# Patient Record
Sex: Male | Born: 1935 | Race: White | Hispanic: No | Marital: Married | Smoking: Never smoker
Health system: Southern US, Community
[De-identification: ages and names within clinical notes are randomized; demographics above are authoritative.]

## PROBLEM LIST (undated history)

## (undated) DIAGNOSIS — I2699 Other pulmonary embolism without acute cor pulmonale: Secondary | ICD-10-CM

## (undated) DIAGNOSIS — F32A Depression, unspecified: Secondary | ICD-10-CM

## (undated) DIAGNOSIS — I255 Ischemic cardiomyopathy: Secondary | ICD-10-CM

## (undated) DIAGNOSIS — I2510 Atherosclerotic heart disease of native coronary artery without angina pectoris: Secondary | ICD-10-CM

## (undated) DIAGNOSIS — F329 Major depressive disorder, single episode, unspecified: Secondary | ICD-10-CM

## (undated) DIAGNOSIS — C61 Malignant neoplasm of prostate: Secondary | ICD-10-CM

## (undated) DIAGNOSIS — I82409 Acute embolism and thrombosis of unspecified deep veins of unspecified lower extremity: Secondary | ICD-10-CM

## (undated) DIAGNOSIS — I10 Essential (primary) hypertension: Secondary | ICD-10-CM

## (undated) DIAGNOSIS — E0590 Thyrotoxicosis, unspecified without thyrotoxic crisis or storm: Secondary | ICD-10-CM

## (undated) DIAGNOSIS — E785 Hyperlipidemia, unspecified: Secondary | ICD-10-CM

## (undated) HISTORY — PX: LAPAROSCOPIC CHOLECYSTECTOMY: SUR755

## (undated) HISTORY — DX: Malignant neoplasm of prostate: C61

## (undated) HISTORY — PX: CATARACT EXTRACTION W/ INTRAOCULAR LENS  IMPLANT, BILATERAL: SHX1307

## (undated) HISTORY — PX: PROSTATE BIOPSY: SHX241

## (undated) HISTORY — PX: JOINT REPLACEMENT: SHX530

## (undated) HISTORY — PX: TOTAL KNEE ARTHROPLASTY: SHX125

## (undated) HISTORY — DX: Hyperlipidemia, unspecified: E78.5

## (undated) HISTORY — DX: Essential (primary) hypertension: I10

## (undated) HISTORY — PX: SHOULDER OPEN ROTATOR CUFF REPAIR: SHX2407

## (undated) HISTORY — DX: Ischemic cardiomyopathy: I25.5

---

## 2009-03-25 ENCOUNTER — Ambulatory Visit (HOSPITAL_COMMUNITY): Admission: RE | Admit: 2009-03-25 | Discharge: 2009-03-25 | Payer: Self-pay | Admitting: Urology

## 2011-03-01 DIAGNOSIS — I2510 Atherosclerotic heart disease of native coronary artery without angina pectoris: Secondary | ICD-10-CM

## 2011-03-08 HISTORY — PX: CORONARY ARTERY BYPASS GRAFT: SHX141

## 2011-03-17 DIAGNOSIS — I2510 Atherosclerotic heart disease of native coronary artery without angina pectoris: Secondary | ICD-10-CM

## 2011-04-28 ENCOUNTER — Ambulatory Visit (INDEPENDENT_AMBULATORY_CARE_PROVIDER_SITE_OTHER): Payer: Medicare Other | Admitting: Thoracic Surgery (Cardiothoracic Vascular Surgery)

## 2011-04-28 ENCOUNTER — Encounter: Payer: Self-pay | Admitting: Thoracic Surgery (Cardiothoracic Vascular Surgery)

## 2011-04-28 DIAGNOSIS — I2510 Atherosclerotic heart disease of native coronary artery without angina pectoris: Secondary | ICD-10-CM

## 2011-04-28 DIAGNOSIS — Z951 Presence of aortocoronary bypass graft: Secondary | ICD-10-CM

## 2011-04-28 NOTE — Progress Notes (Signed)
The patient is doing very well. Sternum stable, wounds well healed. He has no angina. He is walking an hour a day for exercise. He ha ssenn the cardiologist. Sternal precautions were reviewed with the patient . He will continue followup with his cardiologist. RTC prn.

## 2011-06-10 DIAGNOSIS — Z5189 Encounter for other specified aftercare: Secondary | ICD-10-CM | POA: Diagnosis not present

## 2011-06-10 DIAGNOSIS — I2510 Atherosclerotic heart disease of native coronary artery without angina pectoris: Secondary | ICD-10-CM | POA: Diagnosis not present

## 2011-06-22 DIAGNOSIS — L97509 Non-pressure chronic ulcer of other part of unspecified foot with unspecified severity: Secondary | ICD-10-CM | POA: Diagnosis not present

## 2011-06-28 DIAGNOSIS — Z79899 Other long term (current) drug therapy: Secondary | ICD-10-CM | POA: Diagnosis not present

## 2011-06-28 DIAGNOSIS — I10 Essential (primary) hypertension: Secondary | ICD-10-CM | POA: Diagnosis not present

## 2011-06-28 DIAGNOSIS — I2510 Atherosclerotic heart disease of native coronary artery without angina pectoris: Secondary | ICD-10-CM | POA: Diagnosis not present

## 2011-06-28 DIAGNOSIS — E785 Hyperlipidemia, unspecified: Secondary | ICD-10-CM | POA: Diagnosis not present

## 2011-06-29 DIAGNOSIS — I2510 Atherosclerotic heart disease of native coronary artery without angina pectoris: Secondary | ICD-10-CM | POA: Diagnosis not present

## 2011-07-09 DIAGNOSIS — R269 Unspecified abnormalities of gait and mobility: Secondary | ICD-10-CM | POA: Diagnosis not present

## 2011-07-09 DIAGNOSIS — Z5189 Encounter for other specified aftercare: Secondary | ICD-10-CM | POA: Diagnosis not present

## 2011-07-09 DIAGNOSIS — I2510 Atherosclerotic heart disease of native coronary artery without angina pectoris: Secondary | ICD-10-CM | POA: Diagnosis not present

## 2011-07-09 DIAGNOSIS — Z96659 Presence of unspecified artificial knee joint: Secondary | ICD-10-CM | POA: Diagnosis not present

## 2011-07-09 DIAGNOSIS — M25569 Pain in unspecified knee: Secondary | ICD-10-CM | POA: Diagnosis not present

## 2011-07-09 DIAGNOSIS — M25469 Effusion, unspecified knee: Secondary | ICD-10-CM | POA: Diagnosis not present

## 2011-07-16 DIAGNOSIS — Z5189 Encounter for other specified aftercare: Secondary | ICD-10-CM | POA: Diagnosis not present

## 2011-07-16 DIAGNOSIS — I2510 Atherosclerotic heart disease of native coronary artery without angina pectoris: Secondary | ICD-10-CM | POA: Diagnosis not present

## 2011-07-19 DIAGNOSIS — Z5189 Encounter for other specified aftercare: Secondary | ICD-10-CM | POA: Diagnosis not present

## 2011-07-19 DIAGNOSIS — I2510 Atherosclerotic heart disease of native coronary artery without angina pectoris: Secondary | ICD-10-CM | POA: Diagnosis not present

## 2011-07-20 DIAGNOSIS — T84039A Mechanical loosening of unspecified internal prosthetic joint, initial encounter: Secondary | ICD-10-CM | POA: Diagnosis not present

## 2011-07-20 DIAGNOSIS — M25569 Pain in unspecified knee: Secondary | ICD-10-CM | POA: Diagnosis not present

## 2011-07-20 DIAGNOSIS — M25469 Effusion, unspecified knee: Secondary | ICD-10-CM | POA: Diagnosis not present

## 2011-07-20 DIAGNOSIS — Z96659 Presence of unspecified artificial knee joint: Secondary | ICD-10-CM | POA: Diagnosis not present

## 2011-07-21 DIAGNOSIS — Z5189 Encounter for other specified aftercare: Secondary | ICD-10-CM | POA: Diagnosis not present

## 2011-07-21 DIAGNOSIS — I2510 Atherosclerotic heart disease of native coronary artery without angina pectoris: Secondary | ICD-10-CM | POA: Diagnosis not present

## 2011-07-23 DIAGNOSIS — Z5189 Encounter for other specified aftercare: Secondary | ICD-10-CM | POA: Diagnosis not present

## 2011-07-23 DIAGNOSIS — I2510 Atherosclerotic heart disease of native coronary artery without angina pectoris: Secondary | ICD-10-CM | POA: Diagnosis not present

## 2011-07-23 DIAGNOSIS — C61 Malignant neoplasm of prostate: Secondary | ICD-10-CM | POA: Diagnosis not present

## 2011-07-26 DIAGNOSIS — Z5189 Encounter for other specified aftercare: Secondary | ICD-10-CM | POA: Diagnosis not present

## 2011-07-26 DIAGNOSIS — R269 Unspecified abnormalities of gait and mobility: Secondary | ICD-10-CM | POA: Diagnosis not present

## 2011-07-26 DIAGNOSIS — I2510 Atherosclerotic heart disease of native coronary artery without angina pectoris: Secondary | ICD-10-CM | POA: Diagnosis not present

## 2011-07-26 DIAGNOSIS — M25569 Pain in unspecified knee: Secondary | ICD-10-CM | POA: Diagnosis not present

## 2011-07-26 DIAGNOSIS — Z96659 Presence of unspecified artificial knee joint: Secondary | ICD-10-CM | POA: Diagnosis not present

## 2011-07-26 DIAGNOSIS — M25469 Effusion, unspecified knee: Secondary | ICD-10-CM | POA: Diagnosis not present

## 2011-07-27 DIAGNOSIS — L97509 Non-pressure chronic ulcer of other part of unspecified foot with unspecified severity: Secondary | ICD-10-CM | POA: Diagnosis not present

## 2011-07-28 DIAGNOSIS — I2510 Atherosclerotic heart disease of native coronary artery without angina pectoris: Secondary | ICD-10-CM | POA: Diagnosis not present

## 2011-07-28 DIAGNOSIS — Z5189 Encounter for other specified aftercare: Secondary | ICD-10-CM | POA: Diagnosis not present

## 2011-07-30 DIAGNOSIS — I2510 Atherosclerotic heart disease of native coronary artery without angina pectoris: Secondary | ICD-10-CM | POA: Diagnosis not present

## 2011-07-30 DIAGNOSIS — Z5189 Encounter for other specified aftercare: Secondary | ICD-10-CM | POA: Diagnosis not present

## 2011-08-02 DIAGNOSIS — I2510 Atherosclerotic heart disease of native coronary artery without angina pectoris: Secondary | ICD-10-CM | POA: Diagnosis not present

## 2011-08-02 DIAGNOSIS — Z5189 Encounter for other specified aftercare: Secondary | ICD-10-CM | POA: Diagnosis not present

## 2011-08-04 DIAGNOSIS — I2510 Atherosclerotic heart disease of native coronary artery without angina pectoris: Secondary | ICD-10-CM | POA: Diagnosis not present

## 2011-08-04 DIAGNOSIS — Z5189 Encounter for other specified aftercare: Secondary | ICD-10-CM | POA: Diagnosis not present

## 2011-08-05 DIAGNOSIS — Z5189 Encounter for other specified aftercare: Secondary | ICD-10-CM | POA: Diagnosis not present

## 2011-08-05 DIAGNOSIS — I2510 Atherosclerotic heart disease of native coronary artery without angina pectoris: Secondary | ICD-10-CM | POA: Diagnosis not present

## 2011-08-11 DIAGNOSIS — Z5189 Encounter for other specified aftercare: Secondary | ICD-10-CM | POA: Diagnosis not present

## 2011-08-11 DIAGNOSIS — Z951 Presence of aortocoronary bypass graft: Secondary | ICD-10-CM | POA: Diagnosis not present

## 2011-08-11 DIAGNOSIS — I2510 Atherosclerotic heart disease of native coronary artery without angina pectoris: Secondary | ICD-10-CM | POA: Diagnosis not present

## 2011-08-13 DIAGNOSIS — I2510 Atherosclerotic heart disease of native coronary artery without angina pectoris: Secondary | ICD-10-CM | POA: Diagnosis not present

## 2011-08-13 DIAGNOSIS — Z5189 Encounter for other specified aftercare: Secondary | ICD-10-CM | POA: Diagnosis not present

## 2011-08-13 DIAGNOSIS — Z951 Presence of aortocoronary bypass graft: Secondary | ICD-10-CM | POA: Diagnosis not present

## 2011-08-27 DIAGNOSIS — H26499 Other secondary cataract, unspecified eye: Secondary | ICD-10-CM | POA: Diagnosis not present

## 2011-08-27 DIAGNOSIS — H02409 Unspecified ptosis of unspecified eyelid: Secondary | ICD-10-CM | POA: Diagnosis not present

## 2011-08-27 DIAGNOSIS — H04129 Dry eye syndrome of unspecified lacrimal gland: Secondary | ICD-10-CM | POA: Diagnosis not present

## 2011-08-31 DIAGNOSIS — L97509 Non-pressure chronic ulcer of other part of unspecified foot with unspecified severity: Secondary | ICD-10-CM | POA: Diagnosis not present

## 2011-09-02 DIAGNOSIS — E785 Hyperlipidemia, unspecified: Secondary | ICD-10-CM | POA: Diagnosis not present

## 2011-09-02 DIAGNOSIS — I2510 Atherosclerotic heart disease of native coronary artery without angina pectoris: Secondary | ICD-10-CM | POA: Diagnosis not present

## 2011-09-02 DIAGNOSIS — I10 Essential (primary) hypertension: Secondary | ICD-10-CM | POA: Diagnosis not present

## 2011-10-05 DIAGNOSIS — L97509 Non-pressure chronic ulcer of other part of unspecified foot with unspecified severity: Secondary | ICD-10-CM | POA: Diagnosis not present

## 2011-11-09 DIAGNOSIS — L97509 Non-pressure chronic ulcer of other part of unspecified foot with unspecified severity: Secondary | ICD-10-CM | POA: Diagnosis not present

## 2011-11-29 DIAGNOSIS — E785 Hyperlipidemia, unspecified: Secondary | ICD-10-CM | POA: Diagnosis not present

## 2011-11-29 DIAGNOSIS — I2510 Atherosclerotic heart disease of native coronary artery without angina pectoris: Secondary | ICD-10-CM | POA: Diagnosis not present

## 2011-11-29 DIAGNOSIS — I10 Essential (primary) hypertension: Secondary | ICD-10-CM | POA: Diagnosis not present

## 2012-01-04 DIAGNOSIS — L97509 Non-pressure chronic ulcer of other part of unspecified foot with unspecified severity: Secondary | ICD-10-CM | POA: Diagnosis not present

## 2012-01-25 DIAGNOSIS — C61 Malignant neoplasm of prostate: Secondary | ICD-10-CM | POA: Diagnosis not present

## 2012-02-01 DIAGNOSIS — L820 Inflamed seborrheic keratosis: Secondary | ICD-10-CM | POA: Diagnosis not present

## 2012-02-01 DIAGNOSIS — L578 Other skin changes due to chronic exposure to nonionizing radiation: Secondary | ICD-10-CM | POA: Diagnosis not present

## 2012-02-01 DIAGNOSIS — C4441 Basal cell carcinoma of skin of scalp and neck: Secondary | ICD-10-CM | POA: Diagnosis not present

## 2012-02-01 DIAGNOSIS — D2370 Other benign neoplasm of skin of unspecified lower limb, including hip: Secondary | ICD-10-CM | POA: Diagnosis not present

## 2012-02-01 DIAGNOSIS — L259 Unspecified contact dermatitis, unspecified cause: Secondary | ICD-10-CM | POA: Diagnosis not present

## 2012-02-01 DIAGNOSIS — L821 Other seborrheic keratosis: Secondary | ICD-10-CM | POA: Diagnosis not present

## 2012-02-08 DIAGNOSIS — L97509 Non-pressure chronic ulcer of other part of unspecified foot with unspecified severity: Secondary | ICD-10-CM | POA: Diagnosis not present

## 2012-03-01 DIAGNOSIS — I2510 Atherosclerotic heart disease of native coronary artery without angina pectoris: Secondary | ICD-10-CM | POA: Diagnosis not present

## 2012-03-01 DIAGNOSIS — I10 Essential (primary) hypertension: Secondary | ICD-10-CM | POA: Diagnosis not present

## 2012-03-01 DIAGNOSIS — E785 Hyperlipidemia, unspecified: Secondary | ICD-10-CM | POA: Diagnosis not present

## 2012-03-06 DIAGNOSIS — Z23 Encounter for immunization: Secondary | ICD-10-CM | POA: Diagnosis not present

## 2012-03-06 DIAGNOSIS — I2510 Atherosclerotic heart disease of native coronary artery without angina pectoris: Secondary | ICD-10-CM | POA: Diagnosis not present

## 2012-03-14 DIAGNOSIS — L97509 Non-pressure chronic ulcer of other part of unspecified foot with unspecified severity: Secondary | ICD-10-CM | POA: Diagnosis not present

## 2012-04-18 DIAGNOSIS — L97509 Non-pressure chronic ulcer of other part of unspecified foot with unspecified severity: Secondary | ICD-10-CM | POA: Diagnosis not present

## 2012-05-23 DIAGNOSIS — L97509 Non-pressure chronic ulcer of other part of unspecified foot with unspecified severity: Secondary | ICD-10-CM | POA: Diagnosis not present

## 2012-07-04 DIAGNOSIS — L97509 Non-pressure chronic ulcer of other part of unspecified foot with unspecified severity: Secondary | ICD-10-CM | POA: Diagnosis not present

## 2012-07-10 DIAGNOSIS — C61 Malignant neoplasm of prostate: Secondary | ICD-10-CM | POA: Diagnosis not present

## 2012-07-17 DIAGNOSIS — E785 Hyperlipidemia, unspecified: Secondary | ICD-10-CM | POA: Diagnosis not present

## 2012-07-17 DIAGNOSIS — I10 Essential (primary) hypertension: Secondary | ICD-10-CM | POA: Diagnosis not present

## 2012-07-17 DIAGNOSIS — I2510 Atherosclerotic heart disease of native coronary artery without angina pectoris: Secondary | ICD-10-CM | POA: Diagnosis not present

## 2012-07-26 DIAGNOSIS — I10 Essential (primary) hypertension: Secondary | ICD-10-CM | POA: Diagnosis not present

## 2012-08-08 DIAGNOSIS — L97509 Non-pressure chronic ulcer of other part of unspecified foot with unspecified severity: Secondary | ICD-10-CM | POA: Diagnosis not present

## 2012-08-25 DIAGNOSIS — R609 Edema, unspecified: Secondary | ICD-10-CM | POA: Diagnosis not present

## 2012-09-12 DIAGNOSIS — L97509 Non-pressure chronic ulcer of other part of unspecified foot with unspecified severity: Secondary | ICD-10-CM | POA: Diagnosis not present

## 2012-10-17 DIAGNOSIS — L97509 Non-pressure chronic ulcer of other part of unspecified foot with unspecified severity: Secondary | ICD-10-CM | POA: Diagnosis not present

## 2012-11-06 DIAGNOSIS — M6740 Ganglion, unspecified site: Secondary | ICD-10-CM | POA: Diagnosis not present

## 2012-11-21 DIAGNOSIS — L97509 Non-pressure chronic ulcer of other part of unspecified foot with unspecified severity: Secondary | ICD-10-CM | POA: Diagnosis not present

## 2012-12-20 DIAGNOSIS — J309 Allergic rhinitis, unspecified: Secondary | ICD-10-CM | POA: Diagnosis not present

## 2012-12-26 DIAGNOSIS — L97509 Non-pressure chronic ulcer of other part of unspecified foot with unspecified severity: Secondary | ICD-10-CM | POA: Diagnosis not present

## 2013-01-01 DIAGNOSIS — J0120 Acute ethmoidal sinusitis, unspecified: Secondary | ICD-10-CM | POA: Diagnosis not present

## 2013-01-15 DIAGNOSIS — C61 Malignant neoplasm of prostate: Secondary | ICD-10-CM | POA: Diagnosis not present

## 2013-01-29 DIAGNOSIS — L570 Actinic keratosis: Secondary | ICD-10-CM | POA: Diagnosis not present

## 2013-01-29 DIAGNOSIS — L981 Factitial dermatitis: Secondary | ICD-10-CM | POA: Diagnosis not present

## 2013-01-29 DIAGNOSIS — L821 Other seborrheic keratosis: Secondary | ICD-10-CM | POA: Diagnosis not present

## 2013-01-29 DIAGNOSIS — L820 Inflamed seborrheic keratosis: Secondary | ICD-10-CM | POA: Diagnosis not present

## 2013-01-30 DIAGNOSIS — L97509 Non-pressure chronic ulcer of other part of unspecified foot with unspecified severity: Secondary | ICD-10-CM | POA: Diagnosis not present

## 2013-02-06 DIAGNOSIS — H547 Unspecified visual loss: Secondary | ICD-10-CM | POA: Diagnosis not present

## 2013-02-06 DIAGNOSIS — H524 Presbyopia: Secondary | ICD-10-CM | POA: Diagnosis not present

## 2013-03-06 DIAGNOSIS — L97509 Non-pressure chronic ulcer of other part of unspecified foot with unspecified severity: Secondary | ICD-10-CM | POA: Diagnosis not present

## 2013-04-03 DIAGNOSIS — Z23 Encounter for immunization: Secondary | ICD-10-CM | POA: Diagnosis not present

## 2013-04-10 DIAGNOSIS — L97509 Non-pressure chronic ulcer of other part of unspecified foot with unspecified severity: Secondary | ICD-10-CM | POA: Diagnosis not present

## 2013-05-10 DIAGNOSIS — Z0000 Encounter for general adult medical examination without abnormal findings: Secondary | ICD-10-CM | POA: Diagnosis not present

## 2013-05-10 DIAGNOSIS — R609 Edema, unspecified: Secondary | ICD-10-CM | POA: Diagnosis not present

## 2013-05-10 DIAGNOSIS — E0590 Thyrotoxicosis, unspecified without thyrotoxic crisis or storm: Secondary | ICD-10-CM | POA: Diagnosis not present

## 2013-05-11 DIAGNOSIS — E7800 Pure hypercholesterolemia, unspecified: Secondary | ICD-10-CM | POA: Diagnosis not present

## 2013-05-11 DIAGNOSIS — Z006 Encounter for examination for normal comparison and control in clinical research program: Secondary | ICD-10-CM | POA: Diagnosis not present

## 2013-05-11 DIAGNOSIS — I10 Essential (primary) hypertension: Secondary | ICD-10-CM | POA: Diagnosis not present

## 2013-05-11 DIAGNOSIS — E0590 Thyrotoxicosis, unspecified without thyrotoxic crisis or storm: Secondary | ICD-10-CM | POA: Diagnosis not present

## 2013-05-15 DIAGNOSIS — L97509 Non-pressure chronic ulcer of other part of unspecified foot with unspecified severity: Secondary | ICD-10-CM | POA: Diagnosis not present

## 2013-05-24 DIAGNOSIS — I10 Essential (primary) hypertension: Secondary | ICD-10-CM | POA: Diagnosis not present

## 2013-06-06 DIAGNOSIS — E785 Hyperlipidemia, unspecified: Secondary | ICD-10-CM | POA: Diagnosis not present

## 2013-06-06 DIAGNOSIS — I2510 Atherosclerotic heart disease of native coronary artery without angina pectoris: Secondary | ICD-10-CM | POA: Diagnosis not present

## 2013-06-06 DIAGNOSIS — I10 Essential (primary) hypertension: Secondary | ICD-10-CM | POA: Diagnosis not present

## 2013-06-13 DIAGNOSIS — I2510 Atherosclerotic heart disease of native coronary artery without angina pectoris: Secondary | ICD-10-CM | POA: Diagnosis not present

## 2013-06-13 DIAGNOSIS — Z79899 Other long term (current) drug therapy: Secondary | ICD-10-CM | POA: Diagnosis not present

## 2013-06-19 DIAGNOSIS — L97509 Non-pressure chronic ulcer of other part of unspecified foot with unspecified severity: Secondary | ICD-10-CM | POA: Diagnosis not present

## 2013-07-12 DIAGNOSIS — L02818 Cutaneous abscess of other sites: Secondary | ICD-10-CM | POA: Diagnosis not present

## 2013-07-12 DIAGNOSIS — L0291 Cutaneous abscess, unspecified: Secondary | ICD-10-CM | POA: Diagnosis not present

## 2013-07-12 DIAGNOSIS — L03818 Cellulitis of other sites: Secondary | ICD-10-CM | POA: Diagnosis not present

## 2013-07-12 DIAGNOSIS — L0390 Cellulitis, unspecified: Secondary | ICD-10-CM | POA: Diagnosis not present

## 2013-07-23 DIAGNOSIS — J0120 Acute ethmoidal sinusitis, unspecified: Secondary | ICD-10-CM | POA: Diagnosis not present

## 2013-07-27 DIAGNOSIS — R3915 Urgency of urination: Secondary | ICD-10-CM | POA: Diagnosis not present

## 2013-07-27 DIAGNOSIS — C61 Malignant neoplasm of prostate: Secondary | ICD-10-CM | POA: Diagnosis not present

## 2013-07-31 DIAGNOSIS — L97509 Non-pressure chronic ulcer of other part of unspecified foot with unspecified severity: Secondary | ICD-10-CM | POA: Diagnosis not present

## 2013-08-07 DIAGNOSIS — H26499 Other secondary cataract, unspecified eye: Secondary | ICD-10-CM | POA: Diagnosis not present

## 2013-08-20 DIAGNOSIS — H26499 Other secondary cataract, unspecified eye: Secondary | ICD-10-CM | POA: Diagnosis not present

## 2013-09-04 DIAGNOSIS — L97509 Non-pressure chronic ulcer of other part of unspecified foot with unspecified severity: Secondary | ICD-10-CM | POA: Diagnosis not present

## 2013-10-09 DIAGNOSIS — L97509 Non-pressure chronic ulcer of other part of unspecified foot with unspecified severity: Secondary | ICD-10-CM | POA: Diagnosis not present

## 2013-10-30 DIAGNOSIS — L97509 Non-pressure chronic ulcer of other part of unspecified foot with unspecified severity: Secondary | ICD-10-CM | POA: Diagnosis not present

## 2013-11-09 DIAGNOSIS — H547 Unspecified visual loss: Secondary | ICD-10-CM | POA: Diagnosis not present

## 2013-12-04 DIAGNOSIS — L97509 Non-pressure chronic ulcer of other part of unspecified foot with unspecified severity: Secondary | ICD-10-CM | POA: Diagnosis not present

## 2013-12-20 DIAGNOSIS — E785 Hyperlipidemia, unspecified: Secondary | ICD-10-CM | POA: Diagnosis not present

## 2013-12-20 DIAGNOSIS — I2510 Atherosclerotic heart disease of native coronary artery without angina pectoris: Secondary | ICD-10-CM | POA: Diagnosis not present

## 2013-12-20 DIAGNOSIS — I10 Essential (primary) hypertension: Secondary | ICD-10-CM | POA: Diagnosis not present

## 2013-12-20 DIAGNOSIS — Z79899 Other long term (current) drug therapy: Secondary | ICD-10-CM | POA: Diagnosis not present

## 2013-12-27 DIAGNOSIS — E785 Hyperlipidemia, unspecified: Secondary | ICD-10-CM | POA: Diagnosis not present

## 2013-12-27 DIAGNOSIS — I2510 Atherosclerotic heart disease of native coronary artery without angina pectoris: Secondary | ICD-10-CM | POA: Diagnosis not present

## 2013-12-27 DIAGNOSIS — I10 Essential (primary) hypertension: Secondary | ICD-10-CM | POA: Diagnosis not present

## 2014-01-01 DIAGNOSIS — L97509 Non-pressure chronic ulcer of other part of unspecified foot with unspecified severity: Secondary | ICD-10-CM | POA: Diagnosis not present

## 2014-01-17 DIAGNOSIS — M7020 Olecranon bursitis, unspecified elbow: Secondary | ICD-10-CM | POA: Diagnosis not present

## 2014-02-04 DIAGNOSIS — B351 Tinea unguium: Secondary | ICD-10-CM | POA: Diagnosis not present

## 2014-02-04 DIAGNOSIS — L821 Other seborrheic keratosis: Secondary | ICD-10-CM | POA: Diagnosis not present

## 2014-02-04 DIAGNOSIS — L570 Actinic keratosis: Secondary | ICD-10-CM | POA: Diagnosis not present

## 2014-02-04 DIAGNOSIS — L578 Other skin changes due to chronic exposure to nonionizing radiation: Secondary | ICD-10-CM | POA: Diagnosis not present

## 2014-02-04 DIAGNOSIS — D2370 Other benign neoplasm of skin of unspecified lower limb, including hip: Secondary | ICD-10-CM | POA: Diagnosis not present

## 2014-02-07 DIAGNOSIS — L97509 Non-pressure chronic ulcer of other part of unspecified foot with unspecified severity: Secondary | ICD-10-CM | POA: Diagnosis not present

## 2014-02-15 DIAGNOSIS — Z0000 Encounter for general adult medical examination without abnormal findings: Secondary | ICD-10-CM | POA: Diagnosis not present

## 2014-02-15 DIAGNOSIS — C61 Malignant neoplasm of prostate: Secondary | ICD-10-CM | POA: Diagnosis not present

## 2014-03-01 DIAGNOSIS — H26499 Other secondary cataract, unspecified eye: Secondary | ICD-10-CM | POA: Diagnosis not present

## 2014-03-01 DIAGNOSIS — H524 Presbyopia: Secondary | ICD-10-CM | POA: Diagnosis not present

## 2014-03-18 DIAGNOSIS — H60393 Other infective otitis externa, bilateral: Secondary | ICD-10-CM | POA: Diagnosis not present

## 2014-03-18 DIAGNOSIS — H6123 Impacted cerumen, bilateral: Secondary | ICD-10-CM | POA: Diagnosis not present

## 2014-03-19 DIAGNOSIS — L97501 Non-pressure chronic ulcer of other part of unspecified foot limited to breakdown of skin: Secondary | ICD-10-CM | POA: Diagnosis not present

## 2014-03-19 DIAGNOSIS — L97519 Non-pressure chronic ulcer of other part of right foot with unspecified severity: Secondary | ICD-10-CM | POA: Diagnosis not present

## 2014-04-15 DIAGNOSIS — Z96651 Presence of right artificial knee joint: Secondary | ICD-10-CM | POA: Diagnosis not present

## 2014-04-23 DIAGNOSIS — L97501 Non-pressure chronic ulcer of other part of unspecified foot limited to breakdown of skin: Secondary | ICD-10-CM | POA: Diagnosis not present

## 2014-04-23 DIAGNOSIS — L97519 Non-pressure chronic ulcer of other part of right foot with unspecified severity: Secondary | ICD-10-CM | POA: Diagnosis not present

## 2014-05-28 DIAGNOSIS — L97501 Non-pressure chronic ulcer of other part of unspecified foot limited to breakdown of skin: Secondary | ICD-10-CM | POA: Diagnosis not present

## 2014-05-28 DIAGNOSIS — L97519 Non-pressure chronic ulcer of other part of right foot with unspecified severity: Secondary | ICD-10-CM | POA: Diagnosis not present

## 2014-06-13 DIAGNOSIS — I2510 Atherosclerotic heart disease of native coronary artery without angina pectoris: Secondary | ICD-10-CM | POA: Diagnosis not present

## 2014-06-13 DIAGNOSIS — I10 Essential (primary) hypertension: Secondary | ICD-10-CM | POA: Diagnosis not present

## 2014-06-13 DIAGNOSIS — E785 Hyperlipidemia, unspecified: Secondary | ICD-10-CM | POA: Diagnosis not present

## 2014-06-18 DIAGNOSIS — I10 Essential (primary) hypertension: Secondary | ICD-10-CM | POA: Diagnosis not present

## 2014-06-25 DIAGNOSIS — L2389 Allergic contact dermatitis due to other agents: Secondary | ICD-10-CM | POA: Diagnosis not present

## 2014-07-02 DIAGNOSIS — L97519 Non-pressure chronic ulcer of other part of right foot with unspecified severity: Secondary | ICD-10-CM | POA: Diagnosis not present

## 2014-08-20 DIAGNOSIS — C61 Malignant neoplasm of prostate: Secondary | ICD-10-CM | POA: Diagnosis not present

## 2014-08-22 DIAGNOSIS — L97519 Non-pressure chronic ulcer of other part of right foot with unspecified severity: Secondary | ICD-10-CM | POA: Diagnosis not present

## 2014-09-23 DIAGNOSIS — M1711 Unilateral primary osteoarthritis, right knee: Secondary | ICD-10-CM | POA: Diagnosis not present

## 2014-09-23 DIAGNOSIS — I83891 Varicose veins of right lower extremities with other complications: Secondary | ICD-10-CM | POA: Diagnosis not present

## 2014-09-26 DIAGNOSIS — L97519 Non-pressure chronic ulcer of other part of right foot with unspecified severity: Secondary | ICD-10-CM | POA: Diagnosis not present

## 2014-11-05 DIAGNOSIS — L97511 Non-pressure chronic ulcer of other part of right foot limited to breakdown of skin: Secondary | ICD-10-CM | POA: Diagnosis not present

## 2014-12-10 DIAGNOSIS — L97511 Non-pressure chronic ulcer of other part of right foot limited to breakdown of skin: Secondary | ICD-10-CM | POA: Diagnosis not present

## 2015-01-14 DIAGNOSIS — L97511 Non-pressure chronic ulcer of other part of right foot limited to breakdown of skin: Secondary | ICD-10-CM | POA: Diagnosis not present

## 2015-01-28 DIAGNOSIS — N410 Acute prostatitis: Secondary | ICD-10-CM | POA: Diagnosis not present

## 2015-02-03 DIAGNOSIS — L821 Other seborrheic keratosis: Secondary | ICD-10-CM | POA: Diagnosis not present

## 2015-02-03 DIAGNOSIS — L578 Other skin changes due to chronic exposure to nonionizing radiation: Secondary | ICD-10-CM | POA: Diagnosis not present

## 2015-02-03 DIAGNOSIS — L219 Seborrheic dermatitis, unspecified: Secondary | ICD-10-CM | POA: Diagnosis not present

## 2015-02-18 DIAGNOSIS — L97511 Non-pressure chronic ulcer of other part of right foot limited to breakdown of skin: Secondary | ICD-10-CM | POA: Diagnosis not present

## 2015-02-24 DIAGNOSIS — R3915 Urgency of urination: Secondary | ICD-10-CM | POA: Diagnosis not present

## 2015-02-24 DIAGNOSIS — N402 Nodular prostate without lower urinary tract symptoms: Secondary | ICD-10-CM | POA: Diagnosis not present

## 2015-02-24 DIAGNOSIS — R350 Frequency of micturition: Secondary | ICD-10-CM | POA: Diagnosis not present

## 2015-02-24 DIAGNOSIS — C61 Malignant neoplasm of prostate: Secondary | ICD-10-CM | POA: Diagnosis not present

## 2015-03-31 DIAGNOSIS — H35051 Retinal neovascularization, unspecified, right eye: Secondary | ICD-10-CM | POA: Diagnosis not present

## 2015-03-31 DIAGNOSIS — H532 Diplopia: Secondary | ICD-10-CM | POA: Diagnosis not present

## 2015-04-01 DIAGNOSIS — L97511 Non-pressure chronic ulcer of other part of right foot limited to breakdown of skin: Secondary | ICD-10-CM | POA: Diagnosis not present

## 2015-04-03 DIAGNOSIS — H353121 Nonexudative age-related macular degeneration, left eye, early dry stage: Secondary | ICD-10-CM | POA: Diagnosis not present

## 2015-04-03 DIAGNOSIS — H353212 Exudative age-related macular degeneration, right eye, with inactive choroidal neovascularization: Secondary | ICD-10-CM | POA: Diagnosis not present

## 2015-04-08 DIAGNOSIS — I82409 Acute embolism and thrombosis of unspecified deep veins of unspecified lower extremity: Secondary | ICD-10-CM

## 2015-04-08 DIAGNOSIS — I2699 Other pulmonary embolism without acute cor pulmonale: Secondary | ICD-10-CM

## 2015-04-08 HISTORY — DX: Other pulmonary embolism without acute cor pulmonale: I26.99

## 2015-04-08 HISTORY — DX: Acute embolism and thrombosis of unspecified deep veins of unspecified lower extremity: I82.409

## 2015-04-21 ENCOUNTER — Inpatient Hospital Stay (HOSPITAL_COMMUNITY)
Admission: EM | Admit: 2015-04-21 | Discharge: 2015-04-30 | DRG: 466 | Disposition: A | Discharge status: Skilled Nursing Facility | Payer: Medicare Other | Attending: Internal Medicine | Admitting: Internal Medicine

## 2015-04-21 ENCOUNTER — Encounter (HOSPITAL_COMMUNITY): Payer: Self-pay

## 2015-04-21 ENCOUNTER — Inpatient Hospital Stay (HOSPITAL_COMMUNITY): Payer: Medicare Other

## 2015-04-21 ENCOUNTER — Emergency Department (HOSPITAL_COMMUNITY): Payer: Medicare Other

## 2015-04-21 DIAGNOSIS — R05 Cough: Secondary | ICD-10-CM | POA: Diagnosis present

## 2015-04-21 DIAGNOSIS — M1990 Unspecified osteoarthritis, unspecified site: Secondary | ICD-10-CM | POA: Diagnosis not present

## 2015-04-21 DIAGNOSIS — Y793 Surgical instruments, materials and orthopedic devices (including sutures) associated with adverse incidents: Secondary | ICD-10-CM | POA: Diagnosis present

## 2015-04-21 DIAGNOSIS — Z96612 Presence of left artificial shoulder joint: Secondary | ICD-10-CM | POA: Diagnosis not present

## 2015-04-21 DIAGNOSIS — I5022 Chronic systolic (congestive) heart failure: Secondary | ICD-10-CM | POA: Diagnosis present

## 2015-04-21 DIAGNOSIS — R609 Edema, unspecified: Secondary | ICD-10-CM | POA: Diagnosis not present

## 2015-04-21 DIAGNOSIS — I10 Essential (primary) hypertension: Secondary | ICD-10-CM | POA: Diagnosis not present

## 2015-04-21 DIAGNOSIS — D72829 Elevated white blood cell count, unspecified: Secondary | ICD-10-CM | POA: Diagnosis not present

## 2015-04-21 DIAGNOSIS — I444 Left anterior fascicular block: Secondary | ICD-10-CM | POA: Diagnosis present

## 2015-04-21 DIAGNOSIS — E0590 Thyrotoxicosis, unspecified without thyrotoxic crisis or storm: Secondary | ICD-10-CM | POA: Diagnosis present

## 2015-04-21 DIAGNOSIS — I2510 Atherosclerotic heart disease of native coronary artery without angina pectoris: Secondary | ICD-10-CM | POA: Diagnosis present

## 2015-04-21 DIAGNOSIS — E039 Hypothyroidism, unspecified: Secondary | ICD-10-CM | POA: Diagnosis present

## 2015-04-21 DIAGNOSIS — I82409 Acute embolism and thrombosis of unspecified deep veins of unspecified lower extremity: Secondary | ICD-10-CM | POA: Diagnosis present

## 2015-04-21 DIAGNOSIS — M00861 Arthritis due to other bacteria, right knee: Secondary | ICD-10-CM

## 2015-04-21 DIAGNOSIS — E785 Hyperlipidemia, unspecified: Secondary | ICD-10-CM | POA: Diagnosis present

## 2015-04-21 DIAGNOSIS — Z01810 Encounter for preprocedural cardiovascular examination: Secondary | ICD-10-CM | POA: Diagnosis not present

## 2015-04-21 DIAGNOSIS — I440 Atrioventricular block, first degree: Secondary | ICD-10-CM | POA: Diagnosis present

## 2015-04-21 DIAGNOSIS — M009 Pyogenic arthritis, unspecified: Secondary | ICD-10-CM | POA: Diagnosis not present

## 2015-04-21 DIAGNOSIS — I82403 Acute embolism and thrombosis of unspecified deep veins of lower extremity, bilateral: Secondary | ICD-10-CM | POA: Diagnosis not present

## 2015-04-21 DIAGNOSIS — J189 Pneumonia, unspecified organism: Secondary | ICD-10-CM | POA: Diagnosis not present

## 2015-04-21 DIAGNOSIS — M9711XA Periprosthetic fracture around internal prosthetic right knee joint, initial encounter: Secondary | ICD-10-CM | POA: Diagnosis present

## 2015-04-21 DIAGNOSIS — Z9889 Other specified postprocedural states: Secondary | ICD-10-CM | POA: Diagnosis not present

## 2015-04-21 DIAGNOSIS — Z8546 Personal history of malignant neoplasm of prostate: Secondary | ICD-10-CM | POA: Diagnosis not present

## 2015-04-21 DIAGNOSIS — T8453XA Infection and inflammatory reaction due to internal right knee prosthesis, initial encounter: Principal | ICD-10-CM | POA: Diagnosis present

## 2015-04-21 DIAGNOSIS — C61 Malignant neoplasm of prostate: Secondary | ICD-10-CM | POA: Diagnosis not present

## 2015-04-21 DIAGNOSIS — I824Z3 Acute embolism and thrombosis of unspecified deep veins of distal lower extremity, bilateral: Secondary | ICD-10-CM | POA: Diagnosis not present

## 2015-04-21 DIAGNOSIS — M179 Osteoarthritis of knee, unspecified: Secondary | ICD-10-CM | POA: Diagnosis not present

## 2015-04-21 DIAGNOSIS — B372 Candidiasis of skin and nail: Secondary | ICD-10-CM | POA: Diagnosis not present

## 2015-04-21 DIAGNOSIS — D62 Acute posthemorrhagic anemia: Secondary | ICD-10-CM | POA: Diagnosis not present

## 2015-04-21 DIAGNOSIS — Z96653 Presence of artificial knee joint, bilateral: Secondary | ICD-10-CM | POA: Diagnosis not present

## 2015-04-21 DIAGNOSIS — M25461 Effusion, right knee: Secondary | ICD-10-CM | POA: Diagnosis not present

## 2015-04-21 DIAGNOSIS — Z7982 Long term (current) use of aspirin: Secondary | ICD-10-CM | POA: Diagnosis not present

## 2015-04-21 DIAGNOSIS — R262 Difficulty in walking, not elsewhere classified: Secondary | ICD-10-CM | POA: Diagnosis not present

## 2015-04-21 DIAGNOSIS — M1711 Unilateral primary osteoarthritis, right knee: Secondary | ICD-10-CM | POA: Diagnosis present

## 2015-04-21 DIAGNOSIS — R059 Cough, unspecified: Secondary | ICD-10-CM | POA: Diagnosis present

## 2015-04-21 DIAGNOSIS — Z951 Presence of aortocoronary bypass graft: Secondary | ICD-10-CM | POA: Diagnosis not present

## 2015-04-21 DIAGNOSIS — R339 Retention of urine, unspecified: Secondary | ICD-10-CM | POA: Diagnosis not present

## 2015-04-21 DIAGNOSIS — Z23 Encounter for immunization: Secondary | ICD-10-CM | POA: Diagnosis not present

## 2015-04-21 DIAGNOSIS — Z471 Aftercare following joint replacement surgery: Secondary | ICD-10-CM | POA: Diagnosis not present

## 2015-04-21 DIAGNOSIS — R2241 Localized swelling, mass and lump, right lower limb: Secondary | ICD-10-CM | POA: Diagnosis not present

## 2015-04-21 DIAGNOSIS — M6281 Muscle weakness (generalized): Secondary | ICD-10-CM | POA: Diagnosis not present

## 2015-04-21 DIAGNOSIS — M25561 Pain in right knee: Secondary | ICD-10-CM | POA: Diagnosis not present

## 2015-04-21 DIAGNOSIS — B965 Pseudomonas (aeruginosa) (mallei) (pseudomallei) as the cause of diseases classified elsewhere: Secondary | ICD-10-CM | POA: Diagnosis present

## 2015-04-21 DIAGNOSIS — I824Y3 Acute embolism and thrombosis of unspecified deep veins of proximal lower extremity, bilateral: Secondary | ICD-10-CM | POA: Diagnosis not present

## 2015-04-21 DIAGNOSIS — I255 Ischemic cardiomyopathy: Secondary | ICD-10-CM | POA: Diagnosis not present

## 2015-04-21 DIAGNOSIS — A498 Other bacterial infections of unspecified site: Secondary | ICD-10-CM | POA: Insufficient documentation

## 2015-04-21 DIAGNOSIS — Z96651 Presence of right artificial knee joint: Secondary | ICD-10-CM | POA: Diagnosis not present

## 2015-04-21 DIAGNOSIS — Z515 Encounter for palliative care: Secondary | ICD-10-CM | POA: Diagnosis not present

## 2015-04-21 HISTORY — DX: Atherosclerotic heart disease of native coronary artery without angina pectoris: I25.10

## 2015-04-21 HISTORY — DX: Thyrotoxicosis, unspecified without thyrotoxic crisis or storm: E05.90

## 2015-04-21 LAB — LACTIC ACID, PLASMA: Lactic Acid, Venous: 1.7 mmol/L (ref 0.5–2.0)

## 2015-04-21 LAB — GRAM STAIN

## 2015-04-21 LAB — CBC WITH DIFFERENTIAL/PLATELET
BASOS ABS: 0 10*3/uL (ref 0.0–0.1)
Basophils Relative: 0 %
EOS ABS: 0 10*3/uL (ref 0.0–0.7)
Eosinophils Relative: 0 %
HCT: 42 % (ref 39.0–52.0)
Hemoglobin: 13.8 g/dL (ref 13.0–17.0)
LYMPHS PCT: 15 %
Lymphs Abs: 2 10*3/uL (ref 0.7–4.0)
MCH: 30.5 pg (ref 26.0–34.0)
MCHC: 32.9 g/dL (ref 30.0–36.0)
MCV: 92.7 fL (ref 78.0–100.0)
Monocytes Absolute: 1.6 10*3/uL — ABNORMAL HIGH (ref 0.1–1.0)
Monocytes Relative: 12 %
NEUTROS ABS: 10 10*3/uL — AB (ref 1.7–7.7)
NEUTROS PCT: 73 %
PLATELETS: 192 10*3/uL (ref 150–400)
RBC: 4.53 MIL/uL (ref 4.22–5.81)
RDW: 13.9 % (ref 11.5–15.5)
WBC: 13.6 10*3/uL — AB (ref 4.0–10.5)

## 2015-04-21 LAB — BASIC METABOLIC PANEL
ANION GAP: 10 (ref 5–15)
BUN: 27 mg/dL — ABNORMAL HIGH (ref 6–20)
CALCIUM: 8.9 mg/dL (ref 8.9–10.3)
CO2: 25 mmol/L (ref 22–32)
Chloride: 97 mmol/L — ABNORMAL LOW (ref 101–111)
Creatinine, Ser: 0.85 mg/dL (ref 0.61–1.24)
Glucose, Bld: 109 mg/dL — ABNORMAL HIGH (ref 65–99)
Potassium: 4.5 mmol/L (ref 3.5–5.1)
SODIUM: 132 mmol/L — AB (ref 135–145)

## 2015-04-21 LAB — SYNOVIAL CELL COUNT + DIFF, W/ CRYSTALS
Crystals, Fluid: NONE SEEN
EOSINOPHILS-SYNOVIAL: 0 % (ref 0–1)
Lymphocytes-Synovial Fld: 3 % (ref 0–20)
MONOCYTE-MACROPHAGE-SYNOVIAL FLUID: 10 % — AB (ref 50–90)
Neutrophil, Synovial: 87 % — ABNORMAL HIGH (ref 0–25)
WBC, Synovial: 69330 /mm3 — ABNORMAL HIGH (ref 0–200)

## 2015-04-21 LAB — PROTIME-INR
INR: 1.41 (ref 0.00–1.49)
Prothrombin Time: 17.4 seconds — ABNORMAL HIGH (ref 11.6–15.2)

## 2015-04-21 LAB — APTT: APTT: 37 s (ref 24–37)

## 2015-04-21 MED ORDER — METHIMAZOLE 5 MG PO TABS
5.0000 mg | ORAL_TABLET | Freq: Every day | ORAL | Status: DC
  Administered 2015-04-22 – 2015-04-29 (×7): 5 mg via ORAL
  Filled 2015-04-21 (×9): qty 1

## 2015-04-21 MED ORDER — DEXTROSE 5 % IV SOLN
2.0000 g | INTRAVENOUS | Status: DC
  Administered 2015-04-21 – 2015-04-22 (×2): 2 g via INTRAVENOUS
  Filled 2015-04-21 (×3): qty 2

## 2015-04-21 MED ORDER — LISINOPRIL 10 MG PO TABS
10.0000 mg | ORAL_TABLET | Freq: Every day | ORAL | Status: DC
  Administered 2015-04-22: 10 mg via ORAL
  Filled 2015-04-21 (×2): qty 1

## 2015-04-21 MED ORDER — ASPIRIN EC 81 MG PO TBEC
81.0000 mg | DELAYED_RELEASE_TABLET | Freq: Every day | ORAL | Status: DC
  Administered 2015-04-22 – 2015-04-27 (×6): 81 mg via ORAL
  Filled 2015-04-21 (×6): qty 1

## 2015-04-21 MED ORDER — VANCOMYCIN HCL 10 G IV SOLR
1250.0000 mg | INTRAVENOUS | Status: DC
  Filled 2015-04-21: qty 1250

## 2015-04-21 MED ORDER — OXYCODONE-ACETAMINOPHEN 5-325 MG PO TABS
1.0000 | ORAL_TABLET | ORAL | Status: DC | PRN
  Administered 2015-04-22: 1 via ORAL
  Filled 2015-04-21 (×2): qty 1

## 2015-04-21 MED ORDER — SODIUM CHLORIDE 0.9 % IV SOLN
1750.0000 mg | Freq: Once | INTRAVENOUS | Status: AC
  Administered 2015-04-22: 1750 mg via INTRAVENOUS
  Filled 2015-04-21 (×2): qty 1750

## 2015-04-21 MED ORDER — SODIUM CHLORIDE 0.9 % IV SOLN
INTRAVENOUS | Status: AC
  Administered 2015-04-21: 23:00:00 via INTRAVENOUS

## 2015-04-21 MED ORDER — CEFAZOLIN SODIUM 1-5 GM-% IV SOLN
1.0000 g | Freq: Once | INTRAVENOUS | Status: AC
  Administered 2015-04-21: 1 g via INTRAVENOUS
  Filled 2015-04-21: qty 50

## 2015-04-21 MED ORDER — ALUM & MAG HYDROXIDE-SIMETH 200-200-20 MG/5ML PO SUSP: 30.0000 mL | Freq: Four times a day (QID) | ORAL | Status: DC | PRN

## 2015-04-21 MED ORDER — GUAIFENESIN ER 600 MG PO TB12
600.0000 mg | ORAL_TABLET | Freq: Two times a day (BID) | ORAL | Status: DC
  Administered 2015-04-22 (×3): 600 mg via ORAL
  Filled 2015-04-21 (×4): qty 1

## 2015-04-21 MED ORDER — VITAMIN D3 25 MCG (1000 UNIT) PO TABS
1000.0000 [IU] | ORAL_TABLET | Freq: Every day | ORAL | Status: DC
  Administered 2015-04-22 – 2015-04-27 (×6): 1000 [IU] via ORAL
  Filled 2015-04-21 (×12): qty 1

## 2015-04-21 MED ORDER — SIMVASTATIN 10 MG PO TABS
10.0000 mg | ORAL_TABLET | Freq: Every day | ORAL | Status: DC
  Administered 2015-04-22 – 2015-04-29 (×7): 10 mg via ORAL
  Filled 2015-04-21 (×11): qty 1

## 2015-04-21 MED ORDER — CALCIUM-MAGNESIUM-ZINC 333-133-5 MG PO TABS: 1.0000 | ORAL_TABLET | Freq: Three times a day (TID) | ORAL | Status: DC

## 2015-04-21 MED ORDER — ACETAMINOPHEN 500 MG PO TABS
500.0000 mg | ORAL_TABLET | Freq: Four times a day (QID) | ORAL | Status: DC | PRN
  Administered 2015-04-22 – 2015-04-23 (×2): 500 mg via ORAL
  Filled 2015-04-21 (×2): qty 1

## 2015-04-21 MED ORDER — CARVEDILOL 3.125 MG PO TABS
3.1250 mg | ORAL_TABLET | Freq: Two times a day (BID) | ORAL | Status: DC
  Administered 2015-04-22 – 2015-04-30 (×16): 3.125 mg via ORAL
  Filled 2015-04-21 (×17): qty 1

## 2015-04-21 MED ORDER — ONDANSETRON HCL 4 MG/2ML IJ SOLN: 4.0000 mg | Freq: Three times a day (TID) | INTRAMUSCULAR | Status: DC | PRN

## 2015-04-21 NOTE — ED Notes (Signed)
Per family, patient had already taken his night dose of carvedilol tonight with his meal. EDP made aware.

## 2015-04-21 NOTE — ED Notes (Signed)
Pt arrives via ptar from Dr. Remo Lipps Lucey's office for c/o redness and swelling to right knee x4 days. Pt has hx of knee replacement in rt knee. Dr. Gabriel Carina aspirated 75cc of sanguineous fluid off of patient's knee, sample of aspirate sent with patient.

## 2015-04-21 NOTE — ED Notes (Signed)
Report attempted 

## 2015-04-21 NOTE — ED Provider Notes (Signed)
CSN: [id number]     Arrival date & time 04/21/15  1611 History   First MD Initiated Contact with Patient 04/21/15 1706     Chief Complaint  Patient presents with  . Joint Swelling     (Consider location/radiation/quality/duration/timing/severity/associated sxs/prior Treatment) The history is provided by the patient.   Marvon Borba is a 81 y.o. male who is here for evaluation of right knee pain and swelling. Swelling is gradual onset and worsening since he fell, landing on his right knee, 4 days ago. Today he was unable to walk. His son and wife, are trying to help them are unable to manage him, in this state. He typically ambulates on his own without a walking assistive device. No recent fever, chills, nausea, vomiting, new weakness or dizziness. He's taking his usual medications. There are no other known modifying factors.     Past Medical History  Diagnosis Date  . Hypertension   . Hyperlipidemia   . Prostate cancer ([place])     3 YEARS AGO(BEING OBSERVED)   Past Surgical History  Procedure Laterality Date  . Bilateral knee arthroscopy    . Rotator cuff repair      LEFT  . Cholecystectomy     History reviewed. No pertinent family history. Social History  Substance Use Topics  . Smoking status: Never Smoker   . Smokeless tobacco: None  . Alcohol Use: No    Review of Systems  All other systems reviewed and are negative.     Allergies  Review of patient's allergies indicates no known allergies.  Home Medications   Prior to Admission medications   Medication Sig Start Date End Date Taking? Authorizing Provider  acetaminophen (TYLENOL) 500 MG tablet Take 500 mg by mouth every 6 (six) hours as needed for moderate pain.   Yes Historical Provider, MD  aspirin 81 MG tablet Take 81 mg by mouth daily.     Yes Historical Provider, MD  CALCIUM-MAGNESIUM-ZINC PO Take 1 tablet by mouth 3 (three) times daily.    Yes Historical Provider, MD  carvedilol (COREG) 3.125 MG tablet Take  3.125 mg by mouth 2 (two) times daily. 01/23/15  Yes Historical Provider, MD  methimazole (TAPAZOLE) 10 MG tablet Take 5 mg by mouth daily.   Yes Historical Provider, MD  simvastatin (ZOCOR) 10 MG tablet Take 10 mg by mouth at bedtime.     Yes Historical Provider, MD  carvedilol (COREG) 12.5 MG tablet Take 12.5 mg by mouth 2 (two) times daily.      Historical Provider, MD  Cholecalciferol (VITAMIN D-3 PO) Take 1,000 Units by mouth daily.      Historical Provider, MD  lisinopril-hydrochlorothiazide (PRINZIDE,ZESTORETIC) 10-12.5 MG per tablet Take 1 tablet by mouth daily.      Historical Provider, MD   BP 122/99 mmHg  Pulse 79  Temp(Src) 98.2 F (36.8 C) (Oral)  Resp 18  Ht 6' (1.829 m)  Wt 202 lb (91.627 kg)  BMI 27.39 kg/m2  SpO2 97% Physical Exam  Constitutional: He is oriented to person, place, and time. He appears well-developed and well-nourished.  HENT:  Head: Normocephalic and atraumatic.  Right Ear: External ear normal.  Left Ear: External ear normal.  Eyes: Conjunctivae and EOM are normal. Pupils are equal, round, and reactive to light.  Neck: Normal range of motion and phonation normal. Neck supple.  Cardiovascular: Normal rate, regular rhythm and normal heart sounds.   Pulmonary/Chest: Effort normal and breath sounds normal. He exhibits no bony tenderness.  Abdominal:  Soft. There is no tenderness.  Musculoskeletal: Normal range of motion.  Right knee, tender and swollen. No right knee deformity. Moderate swelling, right lower leg, without calf tenderness. Normal capillary refill, toes right foot. Normal distal sensation.  Neurological: He is alert and oriented to person, place, and time. No cranial nerve deficit or sensory deficit. He exhibits normal muscle tone. Coordination normal.  Skin: Skin is warm, dry and intact.  Psychiatric: He has a normal mood and affect. His behavior is normal.  Nursing note and vitals reviewed.   ED Course  Procedures (including critical care  time)  Synovial fluid collected as an outpatient was sent for Gram stain and culture  Medications  ceFAZolin (ANCEF) IVPB 1 g/50 mL premix (0 g Intravenous Stopped 04/21/15 2132)    Patient Vitals for the past 24 hrs:  BP Temp Temp src Pulse Resp SpO2 Height Weight  04/21/15 2130 122/99 mmHg - - 79 - 97 % - -  04/21/15 2115 128/61 mmHg - - 79 - 98 % - -  04/21/15 2100 117/61 mmHg - - 78 - 98 % - -  04/21/15 2045 137/58 mmHg - - 81 - 96 % - -  04/21/15 2000 132/69 mmHg - - 84 - 96 % - -  04/21/15 1945 127/59 mmHg - - 81 - 96 % - -  04/21/15 1930 123/69 mmHg - - 83 - 95 % - -  04/21/15 1900 123/72 mmHg - - 81 - 97 % - -  04/21/15 1830 131/67 mmHg - - 83 - 95 % - -  04/21/15 1815 121/59 mmHg - - 88 - 100 % - -  04/21/15 1809 - 98.2 F (36.8 C) Oral - - - - -  04/21/15 1745 130/65 mmHg - - 86 - 100 % - -  04/21/15 1730 112/70 mmHg - - 91 - 97 % - -  04/21/15 1715 129/66 mmHg - - 92 - 100 % - -  04/21/15 1621 153/75 mmHg - - - - - - -  04/21/15 1615 - 99.9 F (37.7 C) Oral 95 18 95 % 6' (1.829 m) 202 lb (91.627 kg)    20:30- . I discussed case with Dr. Edmonia Lynch, a partner of the patient's orthopedist, Dr. Ronnie Derby. He states that Dr. Lorre Nick told him that the patient had a septic knee and needed to be admitted. He stated that Dr. Lorre Nick would see the patient in the morning, and that the hospitalist should admit the patient. He did not make a statement about antibiotics.  9:36 PM-Consult complete with Dr. Blaine Hamper. Patient case explained and discussed. He agrees to admit patient for further evaluation and treatment. Call ended at 22:00    Labs Review Labs Reviewed  SYNOVIAL CELL COUNT + DIFF, W/ CRYSTALS - Abnormal; Notable for the following:    Color, Synovial RED (*)    Appearance-Synovial TURBID (*)    WBC, Synovial 69330 (*)    Neutrophil, Synovial 87 (*)    Monocyte-Macrophage-Synovial Fluid 10 (*)    All other components within normal limits  BASIC METABOLIC PANEL - Abnormal;  Notable for the following:    Sodium 132 (*)    Chloride 97 (*)    Glucose, Bld 109 (*)    BUN 27 (*)    All other components within normal limits  CBC WITH DIFFERENTIAL/PLATELET - Abnormal; Notable for the following:    WBC 13.6 (*)    Neutro Abs 10.0 (*)    Monocytes Absolute 1.6 (*)  All other components within normal limits  BODY FLUID CULTURE  GLUCOSE, SYNOVIAL FLUID    Imaging Review Ct Knee Right Wo Contrast  04/21/2015  CLINICAL DATA:  Twisting injury to right knee while working in garden. Unable to bear weight, with swelling extending to the foot. Initial encounter. EXAM: CT OF THE RIGHT KNEE WITHOUT CONTRAST TECHNIQUE: Multidetector CT imaging of the right knee was performed according to the standard protocol. Multiplanar CT image reconstructions were also generated. COMPARISON:  None. FINDINGS: There is significant lucency noted about the tibial plateau, underlying the prosthesis. This may reflect some degree of chronic loosening about the plate. There is also lucency underlying the femoral component of the prosthesis, concerning for loosening, and minimally underlying the patellofemoral compartment. No definite acute fracture is seen. Note is made of a moderate complex knee joint effusion, with mild loculation. This is markedly thick walled. The appearance suggests chronic synovial inflammation, though underlying infection cannot be excluded. A tiny focus of air within the collection reflects recent aspiration of fluid. Diffuse soft tissue edema is noted tracking along the right knee and lower leg. Varices are seen along the right lower leg. Diffuse vascular calcifications are noted. IMPRESSION: 1. Significant lucency about the tibial plateau, underlying the prostheses. This may reflect some degree of chronic loosening about the plate. Lucency underlying the femoral component of the prosthesis, and minimally underlying the patellofemoral component, also concerning for loosening. No  definite acute fracture seen. 2. Moderate complex knee joint effusion, with mild loculation. This is markedly thick walled. The appearance suggests chronic synovial inflammation, though underlying infection cannot be excluded. Would correlate with lab findings from recently aspirated fluid. 3. Diffuse soft tissue edema tracking along the right knee and lower leg. 4. Diffuse vascular calcifications seen. 5. Varices noted along the right lower leg. Electronically Signed   By: Garald Balding M.D.   On: 04/21/2015 21:16   I have personally reviewed and evaluated these images and lab results as part of my medical decision-making.   EKG Interpretation None      MDM   Final diagnoses:  Arthritis of right knee due to other bacteria (HCC)    Septic arthritis, right knee, with prosthetic appliance in place. No evidence for fracture, or bacteremia. He will require patient for observation and treatment with potential surgical drainage.  Nursing Notes Reviewed/ Care Coordinated Applicable Imaging Reviewed Interpretation of Laboratory Data incorporated into ED treatment   Plan: Admit  Daleen Bo, MD 04/21/15 2215

## 2015-04-21 NOTE — Progress Notes (Signed)
ANTIBIOTIC CONSULT NOTE - INITIAL  Pharmacy Consult for Ceftriaxone and Vancomycin  Indication: Septic arthritis of right knee  No Known Allergies  Patient Measurements: Height: 6' (182.9 cm) Weight: 202 lb (91.627 kg) IBW/kg (Calculated) : 77.6  Vital Signs: Temp: 98.2 F (36.8 C) (11/14 1809) Temp Source: Oral (11/14 1809) BP: 137/60 mmHg (11/14 2230) Pulse Rate: 90 (11/14 2230) Intake/Output from previous day:   Intake/Output from this shift: Total I/O In: 50 [I.V.:50] Out: -   Labs:  Recent Labs  04/21/15 1951  WBC 13.6*  HGB 13.8  PLT 192  CREATININE 0.85    Microbiology: cx data: 11/14 R synovial fluid   Abx: CTX: 11/14 >> Vancomycin 11/14 >>  Assessment: 43 yoM admitted directly from MD office with concern for  Septic R knee in setting of prosthetic appliance in place. WBC 13.6, SCr 0.85  Goal of Therapy:  Vancomycin trough level 15-20 mcg/ml  Plan:  1. Vancomycin 1750 mg x 1 now; followed by MD of 1250 mg Q24H 2. Ceftriaxone 2 gm Q24H 3. F/u cx data and and narrow abx as feasible  Vincenza Hews, PharmD, BCPS 04/21/2015, 10:45 PM Pager: [phone]

## 2015-04-21 NOTE — H&P (Signed)
[facility] History and Physical  Kenyan Muzik [id number] DOB: [birth year] DOA: 04/21/2015  Referring physician: ED physician PCP: Cyndy Freeze, MD  Specialists:   Chief Complaint: Right knee swelling and pain   HPI: Anthony Osborn is a 81 y.o. male with PMH of hypertension, hyperlipidemia, remote prostate cancer, hypothyroidism, CAD, s/p of CABG, who presents with right knee swelling and pain.  The patient reports that he had a minor injury to his R knee at one year. In the past 4 days, he has been having swelling and pain over right knee joint. He also has mild fever, chills and sweating. He was seen by orthopedic surgeon, Dr. Leona Carry, who did aspiration and removed 75 mL of synovial fluid. Initial analysis showed no crystals, but with WBC 69330. Patient reports that he has mild cough with the yellow colored sputum production, no chest pain or shortness of breath. No abdominal pain, diarrhea, symptoms of UTI, unilateral weakness.  In ED, patient was found to have WBC 13.6, temperature 99.9, heart rate 95, electrolytes okay, renal functioning okay, lactate 1.7. Patient admitted to inpatient for further evaluation and treatment. Orthopedic surgeon, Dr. Percell Miller was consulted by ED.  CT of the right knee showed significant lucency about the tibial plateau, underlying the prostheses. This may reflect some degree of chronic loosening about the plate. Lucency underlying the femoral component of the prosthesis, and minimally underlying the patellofemoral component, also concerning for loosening. No definite acute fracture seen. moderate complex knee joint effusion, with mild loculation. This is markedly thick walled. The appearance suggests chronic synovial inflammation, though underlying infection cannot be excluded. Diffuse soft tissue edema tracking along the right knee and lower leg.Diffuse vascular calcifications seen. Varices noted along the right lower leg.   Where does patient live?    At home   Can patient participate in ADLs? Barely     Review of Systems:   General: has fevers, chills, no changes in body weight, has poor appetite, has fatigue HEENT: no blurry vision, hearing changes or sore throat Pulm: no dyspnea, has coughing, no wheezing CV: no chest pain, palpitations Abd: no nausea, vomiting, abdominal pain, diarrhea, constipation GU: no dysuria, burning on urination, increased urinary frequency, hematuria  Ext: has R knee swelling and pain. Has bilateral  leg edema Neuro: no unilateral weakness, numbness, or tingling, no vision change or hearing loss Skin: no rash MSK: No muscle spasm, no deformity, no limitation of range of movement in spin Heme: No easy bruising.  Travel history: No recent long distant travel.  Allergy: No Known Allergies  Past Medical History  Diagnosis Date  . Hypertension   . Hyperlipidemia   . Prostate cancer ([place])     3 YEARS AGO(BEING OBSERVED)  . Hyperthyroidism   . CAD (coronary artery disease)     Past Surgical History  Procedure Laterality Date  . Bilateral knee arthroscopy    . Rotator cuff repair      LEFT  . Cholecystectomy    . Coronary artery bypass graft      Social History:  reports that he has never smoked. He does not have any smokeless tobacco history on file. He reports that he does not drink alcohol. His drug history is not on file.  Family History:  Family History  Problem Relation Age of Onset  . Stroke Father   . Heart disease Brother   . Diabetes Sister      Prior to Admission medications   Medication Sig Start Date End Date Taking?  Authorizing Provider  acetaminophen (TYLENOL) 500 MG tablet Take 500 mg by mouth every 6 (six) hours as needed for moderate pain.   Yes Historical Provider, MD  aspirin 81 MG tablet Take 81 mg by mouth daily.     Yes Historical Provider, MD  CALCIUM-MAGNESIUM-ZINC PO Take 1 tablet by mouth 3 (three) times daily.    Yes Historical Provider, MD  carvedilol (COREG)  3.125 MG tablet Take 3.125 mg by mouth 2 (two) times daily. 01/23/15  Yes Historical Provider, MD  methimazole (TAPAZOLE) 10 MG tablet Take 5 mg by mouth daily.   Yes Historical Provider, MD  simvastatin (ZOCOR) 10 MG tablet Take 10 mg by mouth at bedtime.     Yes Historical Provider, MD  carvedilol (COREG) 12.5 MG tablet Take 12.5 mg by mouth 2 (two) times daily.      Historical Provider, MD  Cholecalciferol (VITAMIN D-3 PO) Take 1,000 Units by mouth daily.      Historical Provider, MD  lisinopril-hydrochlorothiazide (PRINZIDE,ZESTORETIC) 10-12.5 MG per tablet Take 1 tablet by mouth daily.      Historical Provider, MD    Physical Exam: Filed Vitals:   04/21/15 2245 04/21/15 2300 04/21/15 2315 04/21/15 2354  BP: 140/63 132/61 125/62 133/63  Pulse: 89 91 81 91  Temp:    99.8 F (37.7 C)  TempSrc:    Oral  Resp:    19  Height:    6\' 1"  (1.854 m)  Weight:    79.1 kg (174 lb 6.1 oz)  SpO2: 97% 96% 96% 96%   General: Not in acute distress HEENT:       Eyes: PERRL, EOMI, no scleral icterus.       ENT: No discharge from the ears and nose, no pharynx injection, no tonsillar enlargement.        Neck: No JVD, no bruit, no mass felt. Heme: No neck lymph node enlargement. Cardiac: S1/S2, RRR, No murmurs, No gallops or rubs. Pulm: No rales, wheezing, rhonchi or rubs. Abd: Soft, nondistended, nontender, no rebound pain, no organomegaly, BS present. Ext: has 1+ pitting leg edema bilaterally. 2+DP/PT pulse bilaterally. Musculoskeletal: Right knee is tender, warm and swollen. No right knee deformity. Normal distal sensation Skin: No rashes.  Neuro: Alert, oriented X3, cranial nerves II-XII grossly intact, muscle strength 5/5 in all extremities. Psych: Patient is not psychotic, no suicidal or hemocidal ideation.  Labs on Admission:  Basic Metabolic Panel:  Recent Labs Lab 04/21/15 1951 04/22/15 0115  NA 132* 131*  K 4.5 4.1  CL 97* 94*  CO2 25 27  GLUCOSE 109* 128*  BUN 27* 29*   CREATININE 0.85 0.91  CALCIUM 8.9 8.7*   Liver Function Tests:  Recent Labs Lab 04/22/15 0115  AST 58*  ALT 39  ALKPHOS 134*  BILITOT 1.7*  PROT 5.6*  ALBUMIN 2.3*   No results for input(s): LIPASE, AMYLASE in the last 168 hours. No results for input(s): AMMONIA in the last 168 hours. CBC:  Recent Labs Lab 04/21/15 1951 04/22/15 0115  WBC 13.6* 12.3*  NEUTROABS 10.0*  --   HGB 13.8 13.9  HCT 42.0 41.5  MCV 92.7 93.0  PLT 192 204   Cardiac Enzymes: No results for input(s): CKTOTAL, CKMB, CKMBINDEX, TROPONINI in the last 168 hours.  BNP (last 3 results)  Recent Labs  04/22/15 0115  BNP 621.8*    ProBNP (last 3 results) No results for input(s): PROBNP in the last 8760 hours.  CBG: No results for input(s): GLUCAP in  the last 168 hours.  Radiological Exams on Admission: Ct Knee Right Wo Contrast  04/21/2015  CLINICAL DATA:  Twisting injury to right knee while working in garden. Unable to bear weight, with swelling extending to the foot. Initial encounter. EXAM: CT OF THE RIGHT KNEE WITHOUT CONTRAST TECHNIQUE: Multidetector CT imaging of the right knee was performed according to the standard protocol. Multiplanar CT image reconstructions were also generated. COMPARISON:  None. FINDINGS: There is significant lucency noted about the tibial plateau, underlying the prosthesis. This may reflect some degree of chronic loosening about the plate. There is also lucency underlying the femoral component of the prosthesis, concerning for loosening, and minimally underlying the patellofemoral compartment. No definite acute fracture is seen. Note is made of a moderate complex knee joint effusion, with mild loculation. This is markedly thick walled. The appearance suggests chronic synovial inflammation, though underlying infection cannot be excluded. A tiny focus of air within the collection reflects recent aspiration of fluid. Diffuse soft tissue edema is noted tracking along the  right knee and lower leg. Varices are seen along the right lower leg. Diffuse vascular calcifications are noted. IMPRESSION: 1. Significant lucency about the tibial plateau, underlying the prostheses. This may reflect some degree of chronic loosening about the plate. Lucency underlying the femoral component of the prosthesis, and minimally underlying the patellofemoral component, also concerning for loosening. No definite acute fracture seen. 2. Moderate complex knee joint effusion, with mild loculation. This is markedly thick walled. The appearance suggests chronic synovial inflammation, though underlying infection cannot be excluded. Would correlate with lab findings from recently aspirated fluid. 3. Diffuse soft tissue edema tracking along the right knee and lower leg. 4. Diffuse vascular calcifications seen. 5. Varices noted along the right lower leg. Electronically Signed   By: Garald Balding M.D.   On: 04/21/2015 21:16   Portable Chest 1 View  04/21/2015  CLINICAL DATA:  Right knee pain and swelling, worsening over the last 4 days. Cough. Elevated white count. EXAM: PORTABLE CHEST 1 VIEW COMPARISON:  03/25/2009 FINDINGS: There has been previous median sternotomy and CABG. The heart is enlarged. There is venous hypertension with interstitial edema worrisome for congestive heart failure. Patchy density at the lung bases could also represent pneumonia. No effusions. Bony structures are unremarkable. IMPRESSION: Interval CABG. Cardiomegaly. Pulmonary venous hypertension with interstitial edema. Patchy density at the bases could relate to edema and atelectasis or could represent mild basilar pneumonia. Electronically Signed   By: Nelson Chimes M.D.   On: 04/21/2015 23:18    EKG: Not done in ED, will get one.   Assessment/Plan Principal Problem:   Septic joint of right knee joint (HCC) Active Problems:   Hypertension   Hyperlipidemia   Prostate cancer ([place])   CAD (coronary artery disease)   Septic  arthritis of knee, right (HCC)   Cough   Hyperthyroidism  Septic joint of right knee joint [facility]): Synovial fluid showed WBC 69330 without Crystal, plus leukocytosis and mild fever, consistent with septic joint. Patient does not meet criteria for sepsis, hemodynamically stable. Ortho was consulted by ED.  - will admit to Med-surg - Empiric antimicrobial treatment with vancomycin and rocephin - PRN Zofran for nausea, Percocet for pain - Blood cultures x 2  - will get Procalcitonin and trend lactic acid levels - IVF: 50 cc/h  - INR/PTT/type & screen - F/u ortho recommendations  Cough: Unclear etiology -check CXR -on IV abx as above -sputum culture -prn mucinex  Hypertension: -Switched to Prinzide to  lisinopril 10 mg daily -Coreg  HLD: Last LDL was not on radical -Continue home medications: Zocor -Check FLP  CAD (coronary artery disease): s/p of CABG. No CP. -on ASA, coreg and zocor  Hyperthyroidism: No TSH are recorded. -Continue methimazole -Check TSH   DVT ppx: SCD Code Status: Full code Family Communication: Yes, patient's wife and son  at bed side Disposition Plan: Admit to inpatient   Date of Service 04/22/2015    Ivor Costa [facility] Pager [phone]  If 7PM-7AM, please contact night-coverage [email or web address] Password [email or web address] 04/22/2015, 6:08 AM

## 2015-04-22 ENCOUNTER — Other Ambulatory Visit (HOSPITAL_COMMUNITY): Payer: Medicare Other

## 2015-04-22 DIAGNOSIS — Z96653 Presence of artificial knee joint, bilateral: Secondary | ICD-10-CM

## 2015-04-22 DIAGNOSIS — M009 Pyogenic arthritis, unspecified: Secondary | ICD-10-CM

## 2015-04-22 DIAGNOSIS — Z96612 Presence of left artificial shoulder joint: Secondary | ICD-10-CM

## 2015-04-22 LAB — TYPE AND SCREEN
ABO/RH(D): O POS
Antibody Screen: NEGATIVE

## 2015-04-22 LAB — COMPREHENSIVE METABOLIC PANEL
ALBUMIN: 2.3 g/dL — AB (ref 3.5–5.0)
ALK PHOS: 134 U/L — AB (ref 38–126)
ALT: 39 U/L (ref 17–63)
ANION GAP: 10 (ref 5–15)
AST: 58 U/L — ABNORMAL HIGH (ref 15–41)
BUN: 29 mg/dL — ABNORMAL HIGH (ref 6–20)
CALCIUM: 8.7 mg/dL — AB (ref 8.9–10.3)
CO2: 27 mmol/L (ref 22–32)
Chloride: 94 mmol/L — ABNORMAL LOW (ref 101–111)
Creatinine, Ser: 0.91 mg/dL (ref 0.61–1.24)
GFR calc Af Amer: >60 mL/min (ref >60)
GFR calc non Af Amer: >60 mL/min (ref >60)
GLUCOSE: 128 mg/dL — AB (ref 65–99)
Potassium: 4.1 mmol/L (ref 3.5–5.1)
SODIUM: 131 mmol/L — AB (ref 135–145)
Total Bilirubin: 1.7 mg/dL — ABNORMAL HIGH (ref 0.3–1.2)
Total Protein: 5.6 g/dL — ABNORMAL LOW (ref 6.5–8.1)

## 2015-04-22 LAB — TSH: TSH: 2.759 u[IU]/mL (ref 0.350–4.500)

## 2015-04-22 LAB — URINALYSIS, ROUTINE W REFLEX MICROSCOPIC
BILIRUBIN URINE: NEGATIVE
GLUCOSE, UA: NEGATIVE mg/dL
KETONES UR: NEGATIVE mg/dL
Leukocytes, UA: NEGATIVE
Nitrite: NEGATIVE
PH: 6.5 (ref 5.0–8.0)
Protein, ur: NEGATIVE mg/dL
Specific Gravity, Urine: 1.01 (ref 1.005–1.030)

## 2015-04-22 LAB — URINE MICROSCOPIC-ADD ON: BACTERIA UA: NONE SEEN

## 2015-04-22 LAB — LIPID PANEL
CHOL/HDL RATIO: 4.5 ratio
Cholesterol: 99 mg/dL (ref 0–200)
HDL: 22 mg/dL — AB (ref >40)
LDL CALC: 59 mg/dL (ref 0–99)
Triglycerides: 89 mg/dL (ref <150)
VLDL: 18 mg/dL (ref 0–40)

## 2015-04-22 LAB — CBC
HCT: 41.5 % (ref 39.0–52.0)
HEMOGLOBIN: 13.9 g/dL (ref 13.0–17.0)
MCH: 31.2 pg (ref 26.0–34.0)
MCHC: 33.5 g/dL (ref 30.0–36.0)
MCV: 93 fL (ref 78.0–100.0)
Platelets: 204 10*3/uL (ref 150–400)
RBC: 4.46 MIL/uL (ref 4.22–5.81)
RDW: 13.9 % (ref 11.5–15.5)
WBC: 12.3 10*3/uL — ABNORMAL HIGH (ref 4.0–10.5)

## 2015-04-22 LAB — PROCALCITONIN: Procalcitonin: 0.31 ng/mL

## 2015-04-22 LAB — GLUCOSE, SYNOVIAL FLUID: Glucose, Synovial Fluid: <20 mg/dL

## 2015-04-22 LAB — PATHOLOGIST SMEAR REVIEW: Path Review: NORMAL

## 2015-04-22 LAB — GLUCOSE, CAPILLARY: GLUCOSE-CAPILLARY: 129 mg/dL — AB (ref 65–99)

## 2015-04-22 LAB — BRAIN NATRIURETIC PEPTIDE: B Natriuretic Peptide: 621.8 pg/mL — ABNORMAL HIGH (ref 0.0–100.0)

## 2015-04-22 LAB — LACTIC ACID, PLASMA: LACTIC ACID, VENOUS: 1.5 mmol/L (ref 0.5–2.0)

## 2015-04-22 LAB — ABO/RH: ABO/RH(D): O POS

## 2015-04-22 MED ORDER — DIPHENHYDRAMINE HCL 12.5 MG/5ML PO ELIX: 12.5000 mg | ORAL_SOLUTION | Freq: Every evening | ORAL | Status: DC | PRN

## 2015-04-22 MED ORDER — ENOXAPARIN SODIUM 40 MG/0.4ML ~~LOC~~ SOLN
40.0000 mg | SUBCUTANEOUS | Status: DC
  Administered 2015-04-22 – 2015-04-27 (×6): 40 mg via SUBCUTANEOUS
  Filled 2015-04-22 (×6): qty 0.4

## 2015-04-22 MED ORDER — INFLUENZA VAC SPLIT QUAD 0.5 ML IM SUSY
0.5000 mL | PREFILLED_SYRINGE | INTRAMUSCULAR | Status: AC
  Administered 2015-04-24: 0.5 mL via INTRAMUSCULAR
  Filled 2015-04-22: qty 0.5

## 2015-04-22 MED ORDER — PNEUMOCOCCAL VAC POLYVALENT 25 MCG/0.5ML IJ INJ
0.5000 mL | INJECTION | INTRAMUSCULAR | Status: AC
  Administered 2015-04-24: 0.5 mL via INTRAMUSCULAR
  Filled 2015-04-22 (×2): qty 0.5

## 2015-04-22 MED ORDER — LORAZEPAM 0.5 MG PO TABS
0.2500 mg | ORAL_TABLET | Freq: Once | ORAL | Status: AC
  Administered 2015-04-22: 0.25 mg via ORAL
  Filled 2015-04-22: qty 1

## 2015-04-22 MED ORDER — BUPIVACAINE LIPOSOME 1.3 % IJ SUSP
20.0000 mL | INTRAMUSCULAR | Status: AC
  Filled 2015-04-22: qty 20

## 2015-04-22 MED ORDER — FUROSEMIDE 10 MG/ML IJ SOLN
40.0000 mg | Freq: Once | INTRAMUSCULAR | Status: AC
  Administered 2015-04-22: 40 mg via INTRAVENOUS
  Filled 2015-04-22: qty 4

## 2015-04-22 MED ORDER — MAGNESIUM OXIDE 400 (241.3 MG) MG PO TABS
200.0000 mg | ORAL_TABLET | Freq: Two times a day (BID) | ORAL | Status: DC
  Administered 2015-04-22 – 2015-04-27 (×10): 200 mg via ORAL
  Filled 2015-04-22 (×11): qty 1

## 2015-04-22 MED ORDER — CALCIUM CARBONATE 1250 (500 CA) MG PO TABS
1.0000 | ORAL_TABLET | Freq: Two times a day (BID) | ORAL | Status: DC
  Administered 2015-04-22 – 2015-04-27 (×11): 500 mg via ORAL
  Filled 2015-04-22 (×13): qty 1

## 2015-04-22 MED ORDER — ZOLPIDEM TARTRATE 5 MG PO TABS
5.0000 mg | ORAL_TABLET | Freq: Every evening | ORAL | Status: DC | PRN
Start: 2015-04-22 — End: 2015-04-30
  Administered 2015-04-22 – 2015-04-24 (×2): 5 mg via ORAL
  Filled 2015-04-22 (×2): qty 1

## 2015-04-22 NOTE — Progress Notes (Signed)
Utilization review completed. Leylanie Woodmansee, RN, BSN. 

## 2015-04-22 NOTE — Consult Note (Signed)
NAME: Anthony Osborn MRN:   [id number] DOB:   27-Sep-1919     HISTORY AND PHYSICAL  CHIEF COMPLAINT:  Right knee pain   HISTORY:  Anthony Osborn is a 81 y.o. male with PMH of hypertension, hyperlipidemia, remote prostate cancer, hypothyroidism, CAD, s/p of CABG, who presents with right knee swelling and pain.  The patient reports that he had a minor injury to his R knee at one year. In the past 4 days, he has been having swelling and pain over right knee joint. He also has mild fever, chills and sweating. He was seen by orthopedic surgeon, Dr. Leona Carry, who did aspiration and removed 75 mL of synovial fluid. Initial analysis showed no crystals, but with WBC 69330. Patient reports that he has mild cough with the yellow colored sputum production, no chest pain or shortness of breath. No abdominal pain, diarrhea, symptoms of UTI, unilateral weakness.  In ED, patient was found to have WBC 13.6, temperature 99.9, heart rate 95, electrolytes okay, renal functioning okay, lactate 1.7. Patient admitted to inpatient for further evaluation and treatment. Orthopedic surgeon, Dr. Percell Miller was consulted by ED.  PAST MEDICAL HISTORY:   Past Medical History  Diagnosis Date  . Hypertension   . Hyperlipidemia   . Prostate cancer ([place])     3 YEARS AGO(BEING OBSERVED)  . Hyperthyroidism   . CAD (coronary artery disease)     PAST SURGICAL HISTORY:   Past Surgical History  Procedure Laterality Date  . Bilateral knee arthroscopy    . Rotator cuff repair      LEFT  . Cholecystectomy    . Coronary artery bypass graft      MEDICATIONS:   Medications Prior to Admission  Medication Sig Dispense Refill  . acetaminophen (TYLENOL) 500 MG tablet Take 500 mg by mouth every 6 (six) hours as needed for moderate pain.    Marland Kitchen aspirin 81 MG tablet Take 81 mg by mouth daily.      Marland Kitchen CALCIUM-MAGNESIUM-ZINC PO Take 1 tablet by mouth 3 (three) times daily.     . carvedilol (COREG) 3.125 MG tablet Take 3.125 mg by mouth 2  (two) times daily.    . methimazole (TAPAZOLE) 10 MG tablet Take 5 mg by mouth daily.    . simvastatin (ZOCOR) 10 MG tablet Take 10 mg by mouth at bedtime.      . carvedilol (COREG) 12.5 MG tablet Take 12.5 mg by mouth 2 (two) times daily.      . Cholecalciferol (VITAMIN D-3 PO) Take 1,000 Units by mouth daily.      Marland Kitchen lisinopril-hydrochlorothiazide (PRINZIDE,ZESTORETIC) 10-12.5 MG per tablet Take 1 tablet by mouth daily.        ALLERGIES:  No Known Allergies  REVIEW OF SYSTEMS:   Negative except HPI  FAMILY HISTORY:   Family History  Problem Relation Age of Onset  . Stroke Father   . Heart disease Brother   . Diabetes Sister     SOCIAL HISTORY:   reports that he has never smoked. He does not have any smokeless tobacco history on file. He reports that he does not drink alcohol. His drug history is not on file.  PHYSICAL EXAM:  General appearance: alert, cooperative and no distress Resp: clear to auscultation bilaterally Cardio: regular rate and rhythm, S1, S2 normal, no murmur, click, rub or gallop GI: soft, non-tender; bowel sounds normal; no masses,  no organomegaly Extremities: Homans sign is negative, no sign of DVT and moderate swelling Pulses: 2+ and  symmetric Skin: Skin color, texture, turgor normal. No rashes or lesions Incision/Wound: intact    LABORATORY STUDIES:  Recent Labs  04/21/15 1951 04/22/15 0115  WBC 13.6* 12.3*  HGB 13.8 13.9  HCT 42.0 41.5  PLT 192 204     Recent Labs  04/21/15 1951 04/22/15 0115  NA 132* 131*  K 4.5 4.1  CL 97* 94*  CO2 25 27  GLUCOSE 109* 128*  BUN 27* 29*  CREATININE 0.85 0.91  CALCIUM 8.9 8.7*    STUDIES/RESULTS:  Ct Knee Right Wo Contrast  04/21/2015  CLINICAL DATA:  Twisting injury to right knee while working in garden. Unable to bear weight, with swelling extending to the foot. Initial encounter. EXAM: CT OF THE RIGHT KNEE WITHOUT CONTRAST TECHNIQUE: Multidetector CT imaging of the right knee was performed  according to the standard protocol. Multiplanar CT image reconstructions were also generated. COMPARISON:  None. FINDINGS: There is significant lucency noted about the tibial plateau, underlying the prosthesis. This may reflect some degree of chronic loosening about the plate. There is also lucency underlying the femoral component of the prosthesis, concerning for loosening, and minimally underlying the patellofemoral compartment. No definite acute fracture is seen. Note is made of a moderate complex knee joint effusion, with mild loculation. This is markedly thick walled. The appearance suggests chronic synovial inflammation, though underlying infection cannot be excluded. A tiny focus of air within the collection reflects recent aspiration of fluid. Diffuse soft tissue edema is noted tracking along the right knee and lower leg. Varices are seen along the right lower leg. Diffuse vascular calcifications are noted. IMPRESSION: 1. Significant lucency about the tibial plateau, underlying the prostheses. This may reflect some degree of chronic loosening about the plate. Lucency underlying the femoral component of the prosthesis, and minimally underlying the patellofemoral component, also concerning for loosening. No definite acute fracture seen. 2. Moderate complex knee joint effusion, with mild loculation. This is markedly thick walled. The appearance suggests chronic synovial inflammation, though underlying infection cannot be excluded. Would correlate with lab findings from recently aspirated fluid. 3. Diffuse soft tissue edema tracking along the right knee and lower leg. 4. Diffuse vascular calcifications seen. 5. Varices noted along the right lower leg. Electronically Signed   By: Garald Balding M.D.   On: 04/21/2015 21:16   Portable Chest 1 View  04/21/2015  CLINICAL DATA:  Right knee pain and swelling, worsening over the last 4 days. Cough. Elevated white count. EXAM: PORTABLE CHEST 1 VIEW COMPARISON:   03/25/2009 FINDINGS: There has been previous median sternotomy and CABG. The heart is enlarged. There is venous hypertension with interstitial edema worrisome for congestive heart failure. Patchy density at the lung bases could also represent pneumonia. No effusions. Bony structures are unremarkable. IMPRESSION: Interval CABG. Cardiomegaly. Pulmonary venous hypertension with interstitial edema. Patchy density at the bases could relate to edema and atelectasis or could represent mild basilar pneumonia. Electronically Signed   By: Nelson Chimes M.D.   On: 04/21/2015 23:18    ASSESSMENT: right knee infection with loosening s/p TKA        Principal Problem:   Septic joint of right knee joint (HCC) Active Problems:   Hypertension   Hyperlipidemia   Prostate cancer ([place])   CAD (coronary artery disease)   Septic arthritis of knee, right (HCC)   Cough   Hyperthyroidism    PLAN:  NPO after midnight and total knee revision with I&D on 04/23/15 once cleared by medicine   Zian Mohamed 04/22/2015.  2:06 PM

## 2015-04-22 NOTE — Evaluation (Signed)
Physical Therapy Evaluation Patient Details Name: Anthony Osborn MRN: [id number] DOB: December 04, 1919 Today's Date: 04/22/2015   History of Present Illness  81 yo male admitted iwth R knee swelling and pain. pt with cough and mild fever. PMH: HTN Hyperlipidemia, remote CA prostate, Hypothryoidism, CAD s/p CABG  Clinical Impression  Pt presenting with pain in R knee limiting ability to transfer and ambulate. At this time pt to benefit from ST-SNF however PT to re-evaluate after planned I and D of R knee 11/16.     Follow Up Recommendations SNF;Supervision/Assistance - 24 hour    Equipment Recommendations  Rolling walker with 5" wheels    Recommendations for Other Services       Precautions / Restrictions Precautions Precautions: Fall Precaution Comments: Rt knee extremely painful with any movement Restrictions Weight Bearing Restrictions: No      Mobility  Bed Mobility Overal bed mobility: Needs Assistance;+2 for physical assistance Bed Mobility: Rolling Rolling: +2 for physical assistance;Min assist         General bed mobility comments: Pt required verbal cues for sequence. Used pad to assist with rolling pt to right side.  Transfers Overall transfer level: Needs assistance Equipment used: Rolling walker (2 wheeled) Transfers: Sit to/from [facility] Sit to Stand: Max assist;Total assist Stand pivot transfers: Max assist;+2 physical assistance       General transfer comment: pt unable to use R LE to assist, assist to power up and achieve full upright standing. pt unable to step on R LE due to pain, maxA to pvt on L LE  Ambulation/Gait             General Gait Details: unable to at this time due to pain  Stairs            Wheelchair Mobility    Modified Rankin (Stroke Patients Only)       Balance Overall balance assessment: Needs assistance             Standing balance comment: requires UE assist due to R knee pain                             Pertinent Vitals/Pain Pain Assessment: Faces Faces Pain Scale: Hurts even more Pain Location: R knee Pain Descriptors / Indicators: Aching Pain Intervention(s): Limited activity within patient's tolerance;Monitored during session    Home Living Family/patient expects to be discharged to:: Skilled nursing facility Living Arrangements: Spouse/significant other Available Help at Discharge: Family;Available 24 hours/day Type of Home: House Home Access: Stairs to enter Entrance Stairs-Rails: None Entrance Stairs-Number of Steps: 1 step through garage entrance Home Layout: One level Home Equipment: Wheelchair - manual;Shower seat Additional Comments: Pt reports having flight of stairs to basement, however he has been unable to access basement due to Rt knee pain    Prior Function Level of Independence: Independent         Comments: Prior to admission pt was independent with all ADLs. Pt was driving and doing yard work.     Hand Dominance   Dominant Hand: Right    Extremity/Trunk Assessment   Upper Extremity Assessment: Overall WFL for tasks assessed           Lower Extremity Assessment: RLE deficits/detail RLE Deficits / Details: limited ROM due to pain at knee, noted warmth and edema at R knee    Cervical / Trunk Assessment: Normal  Communication   Communication: No difficulties;HOH  Cognition Arousal/Alertness: Awake/alert  Behavior During Therapy: WFL for tasks assessed/performed Overall Cognitive Status: Within Functional Limits for tasks assessed                      General Comments      Exercises        Assessment/Plan    PT Assessment Patient needs continued PT services  PT Diagnosis Difficulty walking;Acute pain   PT Problem List Decreased strength;Decreased range of motion;Decreased activity tolerance;Decreased balance;Decreased mobility  PT Treatment Interventions DME instruction;Gait training;Functional  mobility training;Therapeutic activities;Therapeutic exercise;Stair training   PT Goals (Current goals can be found in the Care Plan section) Acute Rehab PT Goals Patient Stated Goal: to not have pain PT Goal Formulation: With patient Time For Goal Achievement: 05/06/15 Potential to Achieve Goals: Good    Frequency Min 3X/week   Barriers to discharge Decreased caregiver support (wife avail 24/7 however can't physically assist)      Co-evaluation               End of Session Equipment Utilized During Treatment: Gait belt Activity Tolerance: Patient limited by pain Patient left: in chair;with call bell/phone within reach;with family/visitor present Nurse Communication: Mobility status;Need for lift equipment (use stedy)         Time: [id number] PT Time Calculation (min) (ACUTE ONLY): 14 min   Charges:   PT Evaluation $Initial PT Evaluation Tier I: 1 Procedure PT Treatments $Therapeutic Activity: 8-22 mins   PT G Codes:        Kingsley Callander 04/22/2015, 3:55 PM   Kittie Plater, PT, DPT Pager #: [phone] Office #: [phone]

## 2015-04-22 NOTE — Progress Notes (Addendum)
[facility] PROGRESS NOTE  Anthony Osborn [id number] DOB: [birth year] DOA: 04/21/2015 PCP: Cyndy Freeze, MD  Length of stay: 1   Assessment/Plan: Principal Problem:   Septic joint of right knee joint ([place]) Active Problems:   Hypertension   Hyperlipidemia   Prostate cancer ([place])   CAD (coronary artery disease)   Septic arthritis of knee, right (HCC)   Cough   Hyperthyroidism    Septic joint of right knee joint [facility]): Synovial fluid showed WBC 69330 without Crystal, plus leukocytosis and mild fever, consistent with septic joint. Patient does not meet criteria for sepsis, hemodynamically stable. Discussed with Dr. Lorre Nick, patient will need surgery tomorrow with hardware removal and placement of spacer Continue vancomycin and rocephin - PRN Zofran for nausea, Percocet for pain - Follow Blood cultures x 2  Normal pro calcitonin and lactic acid Nothing by mouth after midnight for surgery tomorrow 2-D echo, EKG, telemetry for perioperative management Infectious disease consultED, continue Rocephin and discontinue vancomycin because synovial fluid culture shows gram-negative rods  Cough: Unclear etiology Chest x-ray pneumonia versus fluid, patient already receiving antibiotics as above Will give 1 dose of IV Lasix today  Hypertension: Continue lisinopril 10 mg daily,-Coreg  HLD: Last LDL was not on radical -Continue home medications: Zocor -Check FLP  CAD (coronary artery disease): s/p of CABG. No CP. -on ASA, coreg and zocor  Hyperthyroidism: No TSH are recorded. -Continue methimazole -Check TSH  History of prostrate cancer Check UA to rule out UTI   DVT prophylaxsis  Lovenox  Code Status:      Code Status Orders        Start     Ordered   04/21/15 2234  Full code   Continuous     04/21/15 2234    Advance Directive Documentation        Most Recent Value   Type of Advance Directive  Healthcare Power of Attorney   Pre-existing out of facility  DNR order (yellow form or pink MOST form)     "MOST" Form in Place?       Family Communication: family updated about patient's clinical progress Disposition Plan:  Surgery tomorrow  Brief narrative: Anthony Osborn is a 81 y.o. male with PMH of hypertension, hyperlipidemia, remote prostate cancer, hypothyroidism, CAD, s/p of CABG, who presents with right knee swelling and pain.  The patient reports that he had a minor injury to his R knee at one year. In the past 4 days, he has been having swelling and pain over right knee joint. He also has mild fever, chills and sweating. He was seen by orthopedic surgeon, Dr. Leona Carry, who did aspiration and removed 75 mL of synovial fluid. Initial analysis showed no crystals, but with WBC 69330. Patient reports that he has mild cough with the yellow colored sputum production, no chest pain or shortness of breath. No abdominal pain, diarrhea, symptoms of UTI, unilateral weakness.  In ED, patient was found to have WBC 13.6, temperature 99.9, heart rate 95, electrolytes okay, renal functioning okay, lactate 1.7. Patient admitted to inpatient for further evaluation and treatment. Orthopedic surgeon, Dr. Percell Miller was consulted by ED.  CT of the right knee showed significant lucency about the tibial plateau, underlying the prostheses. This may reflect some degree of chronic loosening about the plate. Lucency underlying the femoral component of the prosthesis, and minimally underlying the patellofemoral component, also concerning for loosening. No definite acute fracture seen. moderate complex knee joint effusion, with mild loculation. This  is markedly thick walled. The appearance suggests chronic synovial inflammation, though underlying infection cannot be excluded. Diffuse soft tissue edema tracking along the right knee and lower leg.Diffuse vascular calcifications seen. Varices noted along the right lower leg.     Consultants:  Orthopedics  Procedures:  None  Antibiotics: Anti-infectives    Start     Dose/Rate Route Frequency Ordered Stop   04/22/15 2200  vancomycin (VANCOCIN) 1,250 mg in sodium chloride 0.9 % 250 mL IVPB     1,250 mg 166.7 mL/hr over 90 Minutes Intravenous Every 24 hours 04/21/15 2246     04/21/15 2245  vancomycin (VANCOCIN) 1,750 mg in sodium chloride 0.9 % 500 mL IVPB     1,750 mg 250 mL/hr over 120 Minutes Intravenous  Once 04/21/15 2237 04/22/15 0309   04/21/15 2245  cefTRIAXone (ROCEPHIN) 2 g in dextrose 5 % 50 mL IVPB     2 g 100 mL/hr over 30 Minutes Intravenous Every 24 hours 04/21/15 2238     04/21/15 2045  ceFAZolin (ANCEF) IVPB 1 g/50 mL premix     1 g 100 mL/hr over 30 Minutes Intravenous  Once 04/21/15 2038 04/21/15 2132         HPI/Subjective: Patient denies any pain in his knee, afebrile overnight,  Objective: Filed Vitals:   04/21/15 2315 04/21/15 2354 04/22/15 0631 04/22/15 0920  BP: 125/62 133/63 131/65 122/60  Pulse: 81 91 86 81  Temp:  99.8 F (37.7 C) 99.6 F (37.6 C) 98.8 F (37.1 C)  TempSrc:  Oral Oral Oral  Resp:  19 18 18   Height:  6\' 1"  (1.854 m)    Weight:  79.1 kg (174 lb 6.1 oz)    SpO2: 96% 96% 95% 95%    Intake/Output Summary (Last 24 hours) at 04/22/15 1318 Last data filed at 04/22/15 0800  Gross per 24 hour  Intake     50 ml  Output    300 ml  Net   -250 ml    Exam:  General: No acute respiratory distress Lungs: Clear to auscultation bilaterally without wheezes or crackles Cardiovascular: Regular rate and rhythm without murmur gallop or rub normal S1 and S2 Abdomen: Nontender, nondistended, soft, bowel sounds positive, no rebound, no ascites, no appreciable mass Musculoskeletal: Right knee is tender, warm and swollen. No right knee deformity. Normal distal sensation     Data Review   Micro Results Recent Results (from the past 240 hour(s))  Culture, body fluid-bottle     Status: None (Preliminary  result)   Collection Time: 04/21/15  8:02 PM  Result Value Ref Range Status   Specimen Description FLUID RIGHT KNEE  Final   Special Requests NONE  Final   Gram Stain   Final    GRAM NEGATIVE RODS CRITICAL RESULT CALLED TO, READ BACK BY AND VERIFIED WITH: IAnnamaria Helling RN 10:45 04/22/15 ([email or web address])    Culture PENDING  Incomplete   Report Status PENDING  Incomplete  Gram stain     Status: None   Collection Time: 04/21/15  8:02 PM  Result Value Ref Range Status   Specimen Description FLUID RIGHT KNEE  Final   Special Requests NONE  Final   Gram Stain   Final    ABUNDANT WBC PRESENT, PREDOMINANTLY PMN NO ORGANISMS SEEN    Report Status 04/21/2015 FINAL  Final    Radiology Reports Ct Knee Right Wo Contrast  04/21/2015  CLINICAL DATA:  Twisting injury to right knee while working in garden. Unable to bear  weight, with swelling extending to the foot. Initial encounter. EXAM: CT OF THE RIGHT KNEE WITHOUT CONTRAST TECHNIQUE: Multidetector CT imaging of the right knee was performed according to the standard protocol. Multiplanar CT image reconstructions were also generated. COMPARISON:  None. FINDINGS: There is significant lucency noted about the tibial plateau, underlying the prosthesis. This may reflect some degree of chronic loosening about the plate. There is also lucency underlying the femoral component of the prosthesis, concerning for loosening, and minimally underlying the patellofemoral compartment. No definite acute fracture is seen. Note is made of a moderate complex knee joint effusion, with mild loculation. This is markedly thick walled. The appearance suggests chronic synovial inflammation, though underlying infection cannot be excluded. A tiny focus of air within the collection reflects recent aspiration of fluid. Diffuse soft tissue edema is noted tracking along the right knee and lower leg. Varices are seen along the right lower leg. Diffuse vascular calcifications are noted. IMPRESSION: 1.  Significant lucency about the tibial plateau, underlying the prostheses. This may reflect some degree of chronic loosening about the plate. Lucency underlying the femoral component of the prosthesis, and minimally underlying the patellofemoral component, also concerning for loosening. No definite acute fracture seen. 2. Moderate complex knee joint effusion, with mild loculation. This is markedly thick walled. The appearance suggests chronic synovial inflammation, though underlying infection cannot be excluded. Would correlate with lab findings from recently aspirated fluid. 3. Diffuse soft tissue edema tracking along the right knee and lower leg. 4. Diffuse vascular calcifications seen. 5. Varices noted along the right lower leg. Electronically Signed   By: Garald Balding M.D.   On: 04/21/2015 21:16   Portable Chest 1 View  04/21/2015  CLINICAL DATA:  Right knee pain and swelling, worsening over the last 4 days. Cough. Elevated white count. EXAM: PORTABLE CHEST 1 VIEW COMPARISON:  03/25/2009 FINDINGS: There has been previous median sternotomy and CABG. The heart is enlarged. There is venous hypertension with interstitial edema worrisome for congestive heart failure. Patchy density at the lung bases could also represent pneumonia. No effusions. Bony structures are unremarkable. IMPRESSION: Interval CABG. Cardiomegaly. Pulmonary venous hypertension with interstitial edema. Patchy density at the bases could relate to edema and atelectasis or could represent mild basilar pneumonia. Electronically Signed   By: Nelson Chimes M.D.   On: 04/21/2015 23:18     CBC  Recent Labs Lab 04/21/15 1951 04/22/15 0115  WBC 13.6* 12.3*  HGB 13.8 13.9  HCT 42.0 41.5  PLT 192 204  MCV 92.7 93.0  MCH 30.5 31.2  MCHC 32.9 33.5  RDW 13.9 13.9  LYMPHSABS 2.0  --   MONOABS 1.6*  --   EOSABS 0.0  --   BASOSABS 0.0  --     Chemistries   Recent Labs Lab 04/21/15 1951 04/22/15 0115  NA 132* 131*  K 4.5 4.1  CL 97*  94*  CO2 25 27  GLUCOSE 109* 128*  BUN 27* 29*  CREATININE 0.85 0.91  CALCIUM 8.9 8.7*  AST  --  58*  ALT  --  39  ALKPHOS  --  134*  BILITOT  --  1.7*   ------------------------------------------------------------------------------------------------------------------ estimated creatinine clearance is 56.7 mL/min (by C-G formula based on Cr of 0.91). ------------------------------------------------------------------------------------------------------------------ No results for input(s): HGBA1C in the last 72 hours. ------------------------------------------------------------------------------------------------------------------  Recent Labs  04/22/15 0115  CHOL 99  HDL 22*  LDLCALC 59  TRIG 89  CHOLHDL 4.5   ------------------------------------------------------------------------------------------------------------------  Recent Labs  04/22/15 0115  TSH  2.759   ------------------------------------------------------------------------------------------------------------------ No results for input(s): VITAMINB12, FOLATE, FERRITIN, TIBC, IRON, RETICCTPCT in the last 72 hours.  Coagulation profile  Recent Labs Lab 04/21/15 2300  INR 1.41    No results for input(s): DDIMER in the last 72 hours.  Cardiac Enzymes No results for input(s): CKMB, TROPONINI, MYOGLOBIN in the last 168 hours.  Invalid input(s): CK ------------------------------------------------------------------------------------------------------------------ Invalid input(s): POCBNP   CBG:  Recent Labs Lab 04/22/15 0750  GLUCAP 129*       Studies: Ct Knee Right Wo Contrast  04/21/2015  CLINICAL DATA:  Twisting injury to right knee while working in garden. Unable to bear weight, with swelling extending to the foot. Initial encounter. EXAM: CT OF THE RIGHT KNEE WITHOUT CONTRAST TECHNIQUE: Multidetector CT imaging of the right knee was performed according to the standard protocol. Multiplanar CT  image reconstructions were also generated. COMPARISON:  None. FINDINGS: There is significant lucency noted about the tibial plateau, underlying the prosthesis. This may reflect some degree of chronic loosening about the plate. There is also lucency underlying the femoral component of the prosthesis, concerning for loosening, and minimally underlying the patellofemoral compartment. No definite acute fracture is seen. Note is made of a moderate complex knee joint effusion, with mild loculation. This is markedly thick walled. The appearance suggests chronic synovial inflammation, though underlying infection cannot be excluded. A tiny focus of air within the collection reflects recent aspiration of fluid. Diffuse soft tissue edema is noted tracking along the right knee and lower leg. Varices are seen along the right lower leg. Diffuse vascular calcifications are noted. IMPRESSION: 1. Significant lucency about the tibial plateau, underlying the prostheses. This may reflect some degree of chronic loosening about the plate. Lucency underlying the femoral component of the prosthesis, and minimally underlying the patellofemoral component, also concerning for loosening. No definite acute fracture seen. 2. Moderate complex knee joint effusion, with mild loculation. This is markedly thick walled. The appearance suggests chronic synovial inflammation, though underlying infection cannot be excluded. Would correlate with lab findings from recently aspirated fluid. 3. Diffuse soft tissue edema tracking along the right knee and lower leg. 4. Diffuse vascular calcifications seen. 5. Varices noted along the right lower leg. Electronically Signed   By: Garald Balding M.D.   On: 04/21/2015 21:16   Portable Chest 1 View  04/21/2015  CLINICAL DATA:  Right knee pain and swelling, worsening over the last 4 days. Cough. Elevated white count. EXAM: PORTABLE CHEST 1 VIEW COMPARISON:  03/25/2009 FINDINGS: There has been previous median  sternotomy and CABG. The heart is enlarged. There is venous hypertension with interstitial edema worrisome for congestive heart failure. Patchy density at the lung bases could also represent pneumonia. No effusions. Bony structures are unremarkable. IMPRESSION: Interval CABG. Cardiomegaly. Pulmonary venous hypertension with interstitial edema. Patchy density at the bases could relate to edema and atelectasis or could represent mild basilar pneumonia. Electronically Signed   By: Nelson Chimes M.D.   On: 04/21/2015 23:18      No results found for: HGBA1C Lab Results  Component Value Date   LDLCALC 59 04/22/2015   CREATININE 0.91 04/22/2015       Scheduled Meds: . aspirin EC  81 mg Oral Daily  . calcium carbonate  1 tablet Oral BID WC  . carvedilol  3.125 mg Oral BID  . cefTRIAXone (ROCEPHIN)  IV  2 g Intravenous Q24H  . cholecalciferol  1,000 Units Oral Daily  . guaiFENesin  600 mg Oral BID  . [START  ON 04/23/2015] Influenza vac split quadrivalent PF  0.5 mL Intramuscular Tomorrow-1000  . lisinopril  10 mg Oral Daily  . magnesium oxide  200 mg Oral BID  . methimazole  5 mg Oral Daily  . [START ON 04/23/2015] pneumococcal 23 valent vaccine  0.5 mL Intramuscular Tomorrow-1000  . simvastatin  10 mg Oral QHS  . vancomycin  1,250 mg Intravenous Q24H   Continuous Infusions:   Principal Problem:   Septic joint of right knee joint ([facility]) Active Problems:   Hypertension   Hyperlipidemia   Prostate cancer ([facility]CC)   CAD (coronary artery disease)   Septic arthritis of knee, right ([place])   Cough   Hyperthyroidism    Time spent: 45 minutes   [facility] Pager [phone]. If 7PM-7AM, please contact night-coverage at [email or web address], password [email or web address] 04/22/2015, 1:18 PM  LOS: 1 day

## 2015-04-22 NOTE — Clinical Documentation Improvement (Signed)
Internal Medicine  Abnormal Lab/Test Results:  *noted BNP  (621.8) .  Patient given IV Lasix wiith elevated BNP, please provide possible clinical conditions associated with indicators and medication  administered.  Thank you    Other Condition  Cannot Clinically Determine   Supporting Information: Hypertension, CAD, Hyperlipidemia   Treatment Provided: IV Lasix given    Please exercise your independent, professional judgment when responding. A specific answer is not anticipated or expected.   Thank You,  [place] [phone]

## 2015-04-22 NOTE — Progress Notes (Signed)
New Admission Note: pt transferred to the unit from the [facility]   Arrival Method: via stretcher Mental Orientation: alert and oriented x 4 Telemetry: N/A Assessment: Completed Skin: Intact, R Knee swollen IV: R AC Pain: Denies Tubes: None Safety Measures: Safety Fall Prevention Plan has been discussed  Admission: to be completed [address] [place] Orientation: Patient has been orientated to the room, unit and staff.  Family: Spouse and son to bedside  Orders have been reviewed and implemented. Will continue to monitor the patient. Call light has been placed within reach and bed alarm has been activated.   Mady Gemma, RN-BC Phone: [phone]

## 2015-04-22 NOTE — Evaluation (Signed)
Occupational Therapy Evaluation Patient Details Name: Anthony Osborn MRN: [id number] DOB: 12-26-1919 Today's Date: 04/22/2015    History of Present Illness 81 yo male admitted iwth R knee swelling and pain. pt with cough and mild fever. PMH: HTN Hyperlipidemia, remote CA prostate, Hypothryoidism, CAD s/p CABG   Clinical Impression   Pt admitted to hospital due to reason stated above. Pt currently with functional limitiations due to the deficits listed below (see OT problem list). Prior to admission pt was driving and independent with ADLs. Pt currently requires set up to total assistance with ADLs due to pain in Rt knee. Per pt's son and wife, pt needs to be at [facility] before returning home with wife. Recommended mattress overlay if pt's is going to stay in bed, RN notified. Pt will benefit from skilled OT to increase his independence and safety with ADLs and balance to allow discharge to venue listed below.    Follow Up Recommendations  SNF    Equipment Recommendations  Other (comment) (TBD next venue)    Recommendations for Other Services       Precautions / Restrictions Precautions Precautions: Fall Precaution Comments: Rt knee extremely painful with any movement Restrictions Weight Bearing Restrictions: No      Mobility Bed Mobility Overal bed mobility: Needs Assistance;+2 for physical assistance Bed Mobility: Rolling Rolling: +2 for physical assistance;Min assist         General bed mobility comments: Pt required verbal cues for sequence. Used pad to assist with rolling pt to right side.  Transfers                 General transfer comment: Declined transfers due to pain    Balance                                            ADL Overall ADL's : Needs assistance/impaired     Grooming: Wash/dry face;Set up;Bed level                                 General ADL Comments: Prior to admission pt was independent with ADLs, however  since Rt knee pain and swelling has been present pt has been unable to complete ADL independently. Per pt's son, he and pt's wife could not get pt off of toilet. Pt declined any functional mobilty for ADLs due to fear of increase pain in Rt knee. Pt and pt's wife educated in pressure relief technique to prevent decubitis ulcers, pt able to verbalize understanding of same. Placed pillow under pt's left hip to help with pressure relief, recommending pt repositioned pillow to right hip in 30-45 mintues.     Vision     Perception     Praxis      Pertinent Vitals/Pain Pain Assessment: Faces Faces Pain Scale: Hurts even more Pain Location: Rt knee (Pt reports pain with movement) Pain Descriptors / Indicators: Aching;Grimacing;Guarding Pain Intervention(s): Limited activity within patient's tolerance;Patient requesting pain meds-RN notified;Ice applied     Hand Dominance Right   Extremity/Trunk Assessment Upper Extremity Assessment Upper Extremity Assessment: Overall WFL for tasks assessed   Lower Extremity Assessment Lower Extremity Assessment: Defer to PT evaluation       Communication Communication Communication: No difficulties;HOH   Cognition Arousal/Alertness: Awake/alert Behavior During Therapy: WFL for tasks assessed/performed Overall Cognitive Status: Within  Functional Limits for tasks assessed                     General Comments   Pt's son and wife expressed that pt's needs to be at [facility] before returning home. Pt's son lives 4 hours away and wife is unable to assist with ADLs.    Exercises       Shoulder Instructions      Home Living Family/patient expects to be discharged to:: Skilled nursing facility Living Arrangements: Spouse/significant other Available Help at Discharge: Family;Available 24 hours/day Type of Home: House Home Access: Stairs to enter [employer] of Steps: 1 step through garage entrance Entrance Stairs-Rails: None Home  Layout: One level     Bathroom Shower/Tub: Occupational psychologist: Standard     Home Equipment: Wheelchair - manual;Shower seat   Additional Comments: Pt reports having flight of stairs to basement, however he has been unable to access basement due to Rt knee pain      Prior Functioning/Environment Level of Independence: Independent        Comments: Prior to admission pt was independent with all ADLs. Pt was driving and doing yard work.    OT Diagnosis: Generalized weakness;Acute pain   OT Problem List: Decreased activity tolerance;Impaired balance (sitting and/or standing);Decreased knowledge of use of DME or AE;Pain;Decreased strength   OT Treatment/Interventions: Self-care/ADL training;Therapeutic exercise;DME and/or AE instruction;Therapeutic activities;Patient/family education;Balance training    OT Goals(Current goals can be found in the care plan section) Acute Rehab OT Goals Patient Stated Goal: to not have pain OT Goal Formulation: With patient Time For Goal Achievement: 05/06/15 Potential to Achieve Goals: Good ADL Goals Pt Will Perform Grooming: with supervision;sitting Pt Will Transfer to Toilet: with +2 assist;stand pivot transfer;bedside commode Pt/caregiver will Perform Home Exercise Program: Increased strength;Both right and left upper extremity;With theraband;With Supervision Additional ADL Goal #1: Pt will tolerate sitting EOB for ~20 mintues to complete ADLs.  OT Frequency: Min 2X/week   Barriers to D/C:            Co-evaluation              End of Session Nurse Communication: Patient requests pain meds;Other (comment) (mattress overlay )  Activity Tolerance: Patient limited by pain Patient left: in bed;with call bell/phone within reach;with bed alarm set;with family/visitor present   Time: [id number] OT Time Calculation (min): 25 min Charges:  OT General Charges $OT Visit: 1 Procedure OT Evaluation $Initial OT Evaluation Tier  I: 1 Procedure G-Codes:    Lin Landsman 05-21-2015, 11:34 AM

## 2015-04-22 NOTE — Consult Note (Signed)
[facility]       Reason for Consult:Prosthetic joint infection    Referring Physician: Dr. Allyson Sabal  Principal Problem:   Septic joint of right knee joint [facility]) Active Problems:   Hypertension   Hyperlipidemia   Prostate cancer ([place])   CAD (coronary artery disease)   Septic arthritis of knee, right (HCC)   Cough   Hyperthyroidism   . aspirin EC  81 mg Oral Daily  . [START ON 04/23/2015] bupivacaine liposome  20 mL Infiltration To SS-Surg  . calcium carbonate  1 tablet Oral BID WC  . carvedilol  3.125 mg Oral BID  . cefTRIAXone (ROCEPHIN)  IV  2 g Intravenous Q24H  . cholecalciferol  1,000 Units Oral Daily  . enoxaparin (LOVENOX) injection  40 mg Subcutaneous Q24H  . guaiFENesin  600 mg Oral BID  . [START ON 04/23/2015] Influenza vac split quadrivalent PF  0.5 mL Intramuscular Tomorrow-1000  . lisinopril  10 mg Oral Daily  . magnesium oxide  200 mg Oral BID  . methimazole  5 mg Oral Daily  . [START ON 04/23/2015] pneumococcal 23 valent vaccine  0.5 mL Intramuscular Tomorrow-1000  . simvastatin  10 mg Oral QHS    Recommendations: Continue with ceftriaxone I have stopped vancomycin  CRP, ESR   Assessment: He has 69,000 WBC in knee with GNR in gram stain c/w prosthetic joint infection.    Dr. Baxter Flattery to follow up tomorrow  Antibiotics: Vancomycin and ceftriaxone  HPI: Anthony Osborn is a 81 y.o. male with history of bilateral knee arthroplasty, left shoulder prosthesis who came yesterday with 4 days of swelling and pian of right knee.  Had associated fever and chills.  No recent infections including no UTIs, skin infections.  WBC elevated.  Aspiration done of knee and with 69,000 WBCs andgrams stain with GNR.  Started empriically on vancomycin and ceftriaxone.  Moderate to sever pain in knee.  Discussed with son and wife at bedside.    Review of Systems:  Cardiovascular: negative for chest pain Gastrointestinal: negative for nausea,  vomiting and diarrhea All other systems reviewed and are negative   Past Medical History  Diagnosis Date  . Hypertension   . Hyperlipidemia   . Prostate cancer ([place])     3 YEARS AGO(BEING OBSERVED)  . Hyperthyroidism   . CAD (coronary artery disease)     Social History  Substance Use Topics  . Smoking status: Never Smoker   . Smokeless tobacco: None  . Alcohol Use: No    Family History  Problem Relation Age of Onset  . Stroke Father   . Heart disease Brother   . Diabetes Sister     No Known Allergies  Physical Exam: Constitutional: in no apparent distress and alert  Filed Vitals:   04/22/15 0920  BP: 122/60  Pulse: 81  Temp: 98.8 F (37.1 C)  Resp: 18   EYES: anicteric Cardiovascular: Cor RRR and No murmurs Respiratory: clear; normal respiratory effort GI: Bowel sounds are normal Musculoskeletal: right knee with mild warmth, tenderness Skin: negatives: no rash Hematologic: no cervical lad  Lab Results  Component Value Date   WBC 12.3* 04/22/2015   HGB 13.9 04/22/2015   HCT 41.5 04/22/2015   MCV 93.0 04/22/2015   PLT 204 04/22/2015    Lab Results  Component Value Date   CREATININE 0.91 04/22/2015   BUN 29* 04/22/2015   NA 131* 04/22/2015   K 4.1 04/22/2015   CL 94* 04/22/2015  CO2 27 04/22/2015    Lab Results  Component Value Date   ALT 39 04/22/2015   AST 58* 04/22/2015   ALKPHOS 134* 04/22/2015     Microbiology: Recent Results (from the past 240 hour(s))  Culture, body fluid-bottle     Status: None (Preliminary result)   Collection Time: 04/21/15  8:02 PM  Result Value Ref Range Status   Specimen Description FLUID RIGHT KNEE  Final   Special Requests NONE  Final   Gram Stain   Final    GRAM NEGATIVE RODS CRITICAL RESULT CALLED TO, READ BACK BY AND VERIFIED WITH: IAnnamaria Helling RN 10:45 04/22/15 ([email or web address])    Culture NO GROWTH < 24 HOURS  Final   Report Status PENDING  Incomplete  Gram stain     Status: None   Collection Time: 04/21/15   8:02 PM  Result Value Ref Range Status   Specimen Description FLUID RIGHT KNEE  Final   Special Requests NONE  Final   Gram Stain   Final    ABUNDANT WBC PRESENT, PREDOMINANTLY PMN NO ORGANISMS SEEN    Report Status 04/21/2015 FINAL  Final  Culture, blood (routine x 2)     Status: None (Preliminary result)   Collection Time: 04/21/15 10:50 PM  Result Value Ref Range Status   Specimen Description BLOOD LEFT ARM  Final   Special Requests BOTTLES DRAWN AEROBIC AND ANAEROBIC 5CC  Final   Culture NO GROWTH < 24 HOURS  Final   Report Status PENDING  Incomplete  Culture, blood (routine x 2)     Status: None (Preliminary result)   Collection Time: 04/21/15 11:00 PM  Result Value Ref Range Status   Specimen Description BLOOD LEFT FOREARM  Final   Special Requests BOTTLES DRAWN AEROBIC AND ANAEROBIC 10CC  Final   Culture NO GROWTH < 24 HOURS  Final   Report Status PENDING  Incomplete    Scharlene Gloss, [facility] [email or web address] [phone] pager  [phone] cell 04/22/2015, 5:11 PM

## 2015-04-23 ENCOUNTER — Inpatient Hospital Stay (HOSPITAL_COMMUNITY): Payer: Medicare Other

## 2015-04-23 ENCOUNTER — Encounter (HOSPITAL_COMMUNITY)
Admission: EM | Disposition: A | Discharge status: Skilled Nursing Facility | Payer: Self-pay | Source: Home / Self Care | Attending: Internal Medicine

## 2015-04-23 ENCOUNTER — Encounter (HOSPITAL_COMMUNITY): Payer: Self-pay | Admitting: Certified Registered Nurse Anesthetist

## 2015-04-23 DIAGNOSIS — Z01810 Encounter for preprocedural cardiovascular examination: Secondary | ICD-10-CM

## 2015-04-23 DIAGNOSIS — T8453XA Infection and inflammatory reaction due to internal right knee prosthesis, initial encounter: Principal | ICD-10-CM

## 2015-04-23 DIAGNOSIS — B965 Pseudomonas (aeruginosa) (mallei) (pseudomallei) as the cause of diseases classified elsewhere: Secondary | ICD-10-CM

## 2015-04-23 LAB — CBC
HEMATOCRIT: 39.4 % (ref 39.0–52.0)
HEMOGLOBIN: 13.1 g/dL (ref 13.0–17.0)
MCH: 31 pg (ref 26.0–34.0)
MCHC: 33.2 g/dL (ref 30.0–36.0)
MCV: 93.4 fL (ref 78.0–100.0)
Platelets: 198 10*3/uL (ref 150–400)
RBC: 4.22 MIL/uL (ref 4.22–5.81)
RDW: 14 % (ref 11.5–15.5)
WBC: 11.9 10*3/uL — ABNORMAL HIGH (ref 4.0–10.5)

## 2015-04-23 LAB — COMPREHENSIVE METABOLIC PANEL
ALBUMIN: 1.9 g/dL — AB (ref 3.5–5.0)
ALK PHOS: 123 U/L (ref 38–126)
ALT: 48 U/L (ref 17–63)
ANION GAP: 7 (ref 5–15)
AST: 66 U/L — ABNORMAL HIGH (ref 15–41)
BUN: 26 mg/dL — ABNORMAL HIGH (ref 6–20)
CALCIUM: 8.3 mg/dL — AB (ref 8.9–10.3)
CHLORIDE: 101 mmol/L (ref 101–111)
CO2: 26 mmol/L (ref 22–32)
Creatinine, Ser: 0.73 mg/dL (ref 0.61–1.24)
GFR calc Af Amer: >60 mL/min (ref >60)
GFR calc non Af Amer: >60 mL/min (ref >60)
GLUCOSE: 113 mg/dL — AB (ref 65–99)
POTASSIUM: 3.7 mmol/L (ref 3.5–5.1)
Sodium: 134 mmol/L — ABNORMAL LOW (ref 135–145)
Total Bilirubin: 1.1 mg/dL (ref 0.3–1.2)
Total Protein: 5 g/dL — ABNORMAL LOW (ref 6.5–8.1)

## 2015-04-23 LAB — SEDIMENTATION RATE: Sed Rate: 67 mm/hr — ABNORMAL HIGH (ref 0–16)

## 2015-04-23 LAB — C-REACTIVE PROTEIN: CRP: 27.2 mg/dL — ABNORMAL HIGH (ref <1.0)

## 2015-04-23 SURGERY — TOTAL KNEE REVISION
Anesthesia: Spinal | Site: Knee | Laterality: Right

## 2015-04-23 MED ORDER — DEXTROSE 5 % IV SOLN
2.0000 g | Freq: Three times a day (TID) | INTRAVENOUS | Status: DC
  Administered 2015-04-23 – 2015-04-30 (×20): 2 g via INTRAVENOUS
  Filled 2015-04-23 (×27): qty 2

## 2015-04-23 NOTE — Progress Notes (Signed)
[facility] PROGRESS NOTE  Anthony Osborn [id number] DOB: [birth year] DOA: 04/21/2015 PCP: Cyndy Freeze, MD  Length of stay: 2   Assessment/Plan: Principal Problem:   Septic joint of right knee joint ([place]) Active Problems:   Hypertension   Hyperlipidemia   Prostate cancer ([place])   CAD (coronary artery disease)   Septic arthritis of knee, right (HCC)   Cough   Hyperthyroidism    Septic joint of right knee joint [facility]): Synovial fluid showed WBC 69330 without Crystal, plus leukocytosis and mild fever, consistent with septic joint. Patient does not meet criteria for sepsis, hemodynamically stable. Discussed with Dr. Lorre Nick, patient will need surgery tomorrow with hardware removal and placement of spacer,Right knee I&D and total knee revision 11/16 Synovial fluid culture shows Pseudomonas, vancomycin discontinued, infectious disease consulted for further recommendations - PRN Zofran for nausea, Percocet for pain - Follow Blood cultures x 2 , no growth so far Normal pro calcitonin and lactic acid Nothing by mouth after midnight for surgery tomorrow 2-D echo shows LV EF: 25% -  30%o , will consult cardiology for preoperative clearance EKG shows PVCs with LVH, left anterior fascicular block Switch Rocephin to [place] given Pseudomonas   Cough: Unclear etiology Chest x-ray pneumonia versus fluid, patient already receiving antibiotics as above Will give 1 dose of IV Lasix today  Hypertension: Continue lisinopril 10 mg daily,-Coreg  HLD: Last LDL was not on radical -Continue home medications: Zocor -Check FLP  CAD (coronary artery disease): s/p of CABG. No CP. -on ASA, coreg and zocor  Hyperthyroidism: No TSH are recorded. -Continue methimazole -Check TSH  History of prostrate cancer UA negative   DVT prophylaxsis  Lovenox  Code Status:      Code Status Orders        Start     Ordered   04/21/15 2234  Full code   Continuous     04/21/15 2234     Advance Directive Documentation        Most Recent Value   Type of Advance Directive  Healthcare Power of Attorney   Pre-existing out of facility DNR order (yellow form or pink MOST form)     "MOST" Form in Place?       Family Communication: family updated about patient's clinical progress Disposition Plan:  Surgery tomorrow  Brief narrative: Anthony Osborn is a 81 y.o. male with PMH of hypertension, hyperlipidemia, remote prostate cancer, hypothyroidism, CAD, s/p of CABG, who presents with right knee swelling and pain.  The patient reports that he had a minor injury to his R knee at one year. In the past 4 days, he has been having swelling and pain over right knee joint. He also has mild fever, chills and sweating. He was seen by orthopedic surgeon, Dr. Leona Carry, who did aspiration and removed 75 mL of synovial fluid. Initial analysis showed no crystals, but with WBC 69330. Patient reports that he has mild cough with the yellow colored sputum production, no chest pain or shortness of breath. No abdominal pain, diarrhea, symptoms of UTI, unilateral weakness.  In ED, patient was found to have WBC 13.6, temperature 99.9, heart rate 95, electrolytes okay, renal functioning okay, lactate 1.7. Patient admitted to inpatient for further evaluation and treatment. Orthopedic surgeon, Dr. Percell Miller was consulted by ED.  CT of the right knee showed significant lucency about the tibial plateau, underlying the prostheses. This may reflect some degree of chronic loosening about the plate. Lucency underlying the femoral component of  the prosthesis, and minimally underlying the patellofemoral component, also concerning for loosening. No definite acute fracture seen. moderate complex knee joint effusion, with mild loculation. This is markedly thick walled. The appearance suggests chronic synovial inflammation, though underlying infection cannot be excluded. Diffuse soft tissue edema tracking along the right knee and  lower leg.Diffuse vascular calcifications seen. Varices noted along the right lower leg.    Consultants:  Orthopedics  Procedures:  None  Antibiotics: Anti-infectives    Start     Dose/Rate Route Frequency Ordered Stop   04/23/15 1200  cefTAZidime (FORTAZ) 2 g in dextrose 5 % 50 mL IVPB     2 g 100 mL/hr over 30 Minutes Intravenous Every 8 hours 04/23/15 1122     04/22/15 2200  vancomycin (VANCOCIN) 1,250 mg in sodium chloride 0.9 % 250 mL IVPB  Status:  Discontinued     1,250 mg 166.7 mL/hr over 90 Minutes Intravenous Every 24 hours 04/21/15 2246 04/22/15 1419   04/21/15 2245  vancomycin (VANCOCIN) 1,750 mg in sodium chloride 0.9 % 500 mL IVPB     1,750 mg 250 mL/hr over 120 Minutes Intravenous  Once 04/21/15 2237 04/22/15 0309   04/21/15 2245  cefTRIAXone (ROCEPHIN) 2 g in dextrose 5 % 50 mL IVPB  Status:  Discontinued     2 g 100 mL/hr over 30 Minutes Intravenous Every 24 hours 04/21/15 2238 04/23/15 1122   04/21/15 2045  ceFAZolin (ANCEF) IVPB 1 g/50 mL premix     1 g 100 mL/hr over 30 Minutes Intravenous  Once 04/21/15 2038 04/21/15 2132         HPI/Subjective: Painful right Knee, febrile last night ,   Objective: Filed Vitals:   04/22/15 1456 04/22/15 2112 04/22/15 2241 04/23/15 0542  BP: 125/65 131/58  126/51  Pulse: 85 95  75  Temp: 98.7 F (37.1 C) 101.5 F (38.6 C) 98.2 F (36.8 C) 97.6 F (36.4 C)  TempSrc: Oral Oral Oral Oral  Resp: 18 17  18   Height:      Weight:  78.2 kg (172 lb 6.4 oz)    SpO2: 96% 96%  94%    Intake/Output Summary (Last 24 hours) at 04/23/15 1132 Last data filed at 04/23/15 0900  Gross per 24 hour  Intake    700 ml  Output   1925 ml  Net  -1225 ml    Exam:  General: No acute respiratory distress Lungs: Clear to auscultation bilaterally without wheezes or crackles Cardiovascular: Regular rate and rhythm without murmur gallop or rub normal S1 and S2 Abdomen: Nontender, nondistended, soft, bowel sounds positive, no  rebound, no ascites, no appreciable mass Musculoskeletal: Right knee is tender, warm and swollen. No right knee deformity. Normal distal sensation     Data Review   Micro Results Recent Results (from the past 240 hour(s))  Culture, body fluid-bottle     Status: None (Preliminary result)   Collection Time: 04/21/15  8:02 PM  Result Value Ref Range Status   Specimen Description FLUID RIGHT KNEE  Final   Special Requests NONE  Final   Gram Stain   Final    GRAM NEGATIVE RODS CRITICAL RESULT CALLED TO, READ BACK BY AND VERIFIED WITH: IAnnamaria Helling RN 10:45 04/22/15 ([email or web address])    Culture   Final    PSEUDOMONAS AERUGINOSA SUSCEPTIBILITIES TO FOLLOW    Report Status PENDING  Incomplete  Gram stain     Status: None   Collection Time: 04/21/15  8:02 PM  Result Value  Ref Range Status   Specimen Description FLUID RIGHT KNEE  Final   Special Requests NONE  Final   Gram Stain   Final    ABUNDANT WBC PRESENT, PREDOMINANTLY PMN NO ORGANISMS SEEN    Report Status 04/21/2015 FINAL  Final  Culture, blood (routine x 2)     Status: None (Preliminary result)   Collection Time: 04/21/15 10:50 PM  Result Value Ref Range Status   Specimen Description BLOOD LEFT ARM  Final   Special Requests BOTTLES DRAWN AEROBIC AND ANAEROBIC 5CC  Final   Culture NO GROWTH < 24 HOURS  Final   Report Status PENDING  Incomplete  Culture, blood (routine x 2)     Status: None (Preliminary result)   Collection Time: 04/21/15 11:00 PM  Result Value Ref Range Status   Specimen Description BLOOD LEFT FOREARM  Final   Special Requests BOTTLES DRAWN AEROBIC AND ANAEROBIC 10CC  Final   Culture NO GROWTH < 24 HOURS  Final   Report Status PENDING  Incomplete    Radiology Reports Ct Knee Right Wo Contrast  04/21/2015  CLINICAL DATA:  Twisting injury to right knee while working in garden. Unable to bear weight, with swelling extending to the foot. Initial encounter. EXAM: CT OF THE RIGHT KNEE WITHOUT CONTRAST TECHNIQUE:  Multidetector CT imaging of the right knee was performed according to the standard protocol. Multiplanar CT image reconstructions were also generated. COMPARISON:  None. FINDINGS: There is significant lucency noted about the tibial plateau, underlying the prosthesis. This may reflect some degree of chronic loosening about the plate. There is also lucency underlying the femoral component of the prosthesis, concerning for loosening, and minimally underlying the patellofemoral compartment. No definite acute fracture is seen. Note is made of a moderate complex knee joint effusion, with mild loculation. This is markedly thick walled. The appearance suggests chronic synovial inflammation, though underlying infection cannot be excluded. A tiny focus of air within the collection reflects recent aspiration of fluid. Diffuse soft tissue edema is noted tracking along the right knee and lower leg. Varices are seen along the right lower leg. Diffuse vascular calcifications are noted. IMPRESSION: 1. Significant lucency about the tibial plateau, underlying the prostheses. This may reflect some degree of chronic loosening about the plate. Lucency underlying the femoral component of the prosthesis, and minimally underlying the patellofemoral component, also concerning for loosening. No definite acute fracture seen. 2. Moderate complex knee joint effusion, with mild loculation. This is markedly thick walled. The appearance suggests chronic synovial inflammation, though underlying infection cannot be excluded. Would correlate with lab findings from recently aspirated fluid. 3. Diffuse soft tissue edema tracking along the right knee and lower leg. 4. Diffuse vascular calcifications seen. 5. Varices noted along the right lower leg. Electronically Signed   By: Garald Balding M.D.   On: 04/21/2015 21:16   Portable Chest 1 View  04/21/2015  CLINICAL DATA:  Right knee pain and swelling, worsening over the last 4 days. Cough. Elevated  white count. EXAM: PORTABLE CHEST 1 VIEW COMPARISON:  03/25/2009 FINDINGS: There has been previous median sternotomy and CABG. The heart is enlarged. There is venous hypertension with interstitial edema worrisome for congestive heart failure. Patchy density at the lung bases could also represent pneumonia. No effusions. Bony structures are unremarkable. IMPRESSION: Interval CABG. Cardiomegaly. Pulmonary venous hypertension with interstitial edema. Patchy density at the bases could relate to edema and atelectasis or could represent mild basilar pneumonia. Electronically Signed   By: Jan Fireman.D.  On: 04/21/2015 23:18     CBC  Recent Labs Lab 04/21/15 1951 04/22/15 0115 04/23/15 0526  WBC 13.6* 12.3* 11.9*  HGB 13.8 13.9 13.1  HCT 42.0 41.5 39.4  PLT 192 204 198  MCV 92.7 93.0 93.4  MCH 30.5 31.2 31.0  MCHC 32.9 33.5 33.2  RDW 13.9 13.9 14.0  LYMPHSABS 2.0  --   --   MONOABS 1.6*  --   --   EOSABS 0.0  --   --   BASOSABS 0.0  --   --     Chemistries   Recent Labs Lab 04/21/15 1951 04/22/15 0115 04/23/15 0526  NA 132* 131* 134*  K 4.5 4.1 3.7  CL 97* 94* 101  CO2 25 27 26   GLUCOSE 109* 128* 113*  BUN 27* 29* 26*  CREATININE 0.85 0.91 0.73  CALCIUM 8.9 8.7* 8.3*  AST  --  58* 66*  ALT  --  39 48  ALKPHOS  --  134* 123  BILITOT  --  1.7* 1.1   ------------------------------------------------------------------------------------------------------------------ estimated creatinine clearance is 63.8 mL/min (by C-G formula based on Cr of 0.73). ------------------------------------------------------------------------------------------------------------------ No results for input(s): HGBA1C in the last 72 hours. ------------------------------------------------------------------------------------------------------------------  Recent Labs  04/22/15 0115  CHOL 99  HDL 22*  LDLCALC 59  TRIG 89  CHOLHDL 4.5    ------------------------------------------------------------------------------------------------------------------  Recent Labs  04/22/15 0115  TSH 2.759   ------------------------------------------------------------------------------------------------------------------ No results for input(s): VITAMINB12, FOLATE, FERRITIN, TIBC, IRON, RETICCTPCT in the last 72 hours.  Coagulation profile  Recent Labs Lab 04/21/15 2300  INR 1.41    No results for input(s): DDIMER in the last 72 hours.  Cardiac Enzymes No results for input(s): CKMB, TROPONINI, MYOGLOBIN in the last 168 hours.  Invalid input(s): CK ------------------------------------------------------------------------------------------------------------------ Invalid input(s): POCBNP   CBG:  Recent Labs Lab 04/22/15 0750  GLUCAP 129*       Studies: Ct Knee Right Wo Contrast  04/21/2015  CLINICAL DATA:  Twisting injury to right knee while working in garden. Unable to bear weight, with swelling extending to the foot. Initial encounter. EXAM: CT OF THE RIGHT KNEE WITHOUT CONTRAST TECHNIQUE: Multidetector CT imaging of the right knee was performed according to the standard protocol. Multiplanar CT image reconstructions were also generated. COMPARISON:  None. FINDINGS: There is significant lucency noted about the tibial plateau, underlying the prosthesis. This may reflect some degree of chronic loosening about the plate. There is also lucency underlying the femoral component of the prosthesis, concerning for loosening, and minimally underlying the patellofemoral compartment. No definite acute fracture is seen. Note is made of a moderate complex knee joint effusion, with mild loculation. This is markedly thick walled. The appearance suggests chronic synovial inflammation, though underlying infection cannot be excluded. A tiny focus of air within the collection reflects recent aspiration of fluid. Diffuse soft tissue edema is  noted tracking along the right knee and lower leg. Varices are seen along the right lower leg. Diffuse vascular calcifications are noted. IMPRESSION: 1. Significant lucency about the tibial plateau, underlying the prostheses. This may reflect some degree of chronic loosening about the plate. Lucency underlying the femoral component of the prosthesis, and minimally underlying the patellofemoral component, also concerning for loosening. No definite acute fracture seen. 2. Moderate complex knee joint effusion, with mild loculation. This is markedly thick walled. The appearance suggests chronic synovial inflammation, though underlying infection cannot be excluded. Would correlate with lab findings from recently aspirated fluid. 3. Diffuse soft tissue edema tracking along the right  knee and lower leg. 4. Diffuse vascular calcifications seen. 5. Varices noted along the right lower leg. Electronically Signed   By: Garald Balding M.D.   On: 04/21/2015 21:16   Portable Chest 1 View  04/21/2015  CLINICAL DATA:  Right knee pain and swelling, worsening over the last 4 days. Cough. Elevated white count. EXAM: PORTABLE CHEST 1 VIEW COMPARISON:  03/25/2009 FINDINGS: There has been previous median sternotomy and CABG. The heart is enlarged. There is venous hypertension with interstitial edema worrisome for congestive heart failure. Patchy density at the lung bases could also represent pneumonia. No effusions. Bony structures are unremarkable. IMPRESSION: Interval CABG. Cardiomegaly. Pulmonary venous hypertension with interstitial edema. Patchy density at the bases could relate to edema and atelectasis or could represent mild basilar pneumonia. Electronically Signed   By: Nelson Chimes M.D.   On: 04/21/2015 23:18      No results found for: HGBA1C Lab Results  Component Value Date   LDLCALC 59 04/22/2015   CREATININE 0.73 04/23/2015       Scheduled Meds: . aspirin EC  81 mg Oral Daily  . bupivacaine liposome  20 mL  Infiltration To SS-Surg  . calcium carbonate  1 tablet Oral BID WC  . carvedilol  3.125 mg Oral BID  . cefTAZidime (FORTAZ)  IV  2 g Intravenous Q8H  . cholecalciferol  1,000 Units Oral Daily  . enoxaparin (LOVENOX) injection  40 mg Subcutaneous Q24H  . guaiFENesin  600 mg Oral BID  . Influenza vac split quadrivalent PF  0.5 mL Intramuscular Tomorrow-1000  . lisinopril  10 mg Oral Daily  . magnesium oxide  200 mg Oral BID  . methimazole  5 mg Oral Daily  . pneumococcal 23 valent vaccine  0.5 mL Intramuscular Tomorrow-1000  . simvastatin  10 mg Oral QHS   Continuous Infusions:   Principal Problem:   Septic joint of right knee joint (HCC) Active Problems:   Hypertension   Hyperlipidemia   Prostate cancer ([place])   CAD (coronary artery disease)   Septic arthritis of knee, right ([place])   Cough   Hyperthyroidism    Time spent: 45 minutes   [facility] Pager [phone]. If 7PM-7AM, please contact night-coverage at [email or web address], password [facility] 04/23/2015, 11:32 AM  LOS: 2 days

## 2015-04-23 NOTE — Progress Notes (Signed)
ANTIBIOTIC CONSULT NOTE Pharmacy Consult for Ceftriaxone and Vancomycin to Ceftazidime Indication: Septic arthritis of right knee  No Known Allergies  Labs:  Recent Labs  04/21/15 1951 04/22/15 0115 04/23/15 0526  WBC 13.6* 12.3* 11.9*  HGB 13.8 13.9 13.1  PLT 192 204 198  CREATININE 0.85 0.91 0.73    Microbiology: cx data: 11/14 R synovial fluid -> Pseudomonas  Abx: CTX: 11/14 >>11/16 Vancomycin 11/14 >>11/15 Ceftazidime 11/16>  Assessment: 43 yoM admitted directly from MD office with concern for  Septic R knee in setting of prosthetic appliance in place Knee fluid culture with Pseudomonas -> sensitivities pending  ID continues to follow  Goal of Therapy:  Appropriate dosing  Plan:  Ceftazidime 2 grams iv Q 8 hours for now ? Right I+D and total knee revision planned Continue to follow  Thank you Anette Guarneri, PharmD [phone]  04/23/2015, 11:18 AM

## 2015-04-23 NOTE — Progress Notes (Signed)
[facility] for Infectious Disease    Date of Admission:  04/21/2015   Total days of antibiotics 3        Day 1 ceftaz           ID: Anthony Osborn is a 81 y.o. 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 TTE found to be 25% ,Surgery deferred for today until further determination of old vs. New ICM and risk associated with surviving surgery  Medications:  . aspirin EC  81 mg Oral Daily  . bupivacaine liposome  20 mL Infiltration To SS-Surg  . calcium carbonate  1 tablet Oral BID WC  . carvedilol  3.125 mg Oral BID  . cefTAZidime (FORTAZ)  IV  2 g Intravenous Q8H  . cholecalciferol  1,000 Units Oral Daily  . enoxaparin (LOVENOX) injection  40 mg Subcutaneous Q24H  . guaiFENesin  600 mg Oral BID  . Influenza vac split quadrivalent PF  0.5 mL Intramuscular Tomorrow-1000  . lisinopril  10 mg Oral Daily  . magnesium oxide  200 mg Oral BID  . methimazole  5 mg Oral Daily  . pneumococcal 23 valent vaccine  0.5 mL Intramuscular Tomorrow-1000  . simvastatin  10 mg Oral QHS    Objective: Vital signs in last 24 hours: Temp:  [97.6 F (36.4 C)-101.5 F (38.6 C)] 97.6 F (36.4 C) (11/16 0542) Pulse Rate:  [75-95] 75 (11/16 0542) Resp:  [17-18] 18 (11/16 0542) BP: (125-131)/(51-65) 126/51 mmHg (11/16 0542) SpO2:  [94 %-96 %] 94 % (11/16 0542) Weight:  [172 lb 6.4 oz (78.2 kg)] 172 lb 6.4 oz (78.2 kg) (11/15 2112) Physical Exam  Constitutional: He is oriented to person, place, and time. He appears well-developed and well-nourished. No distress.  HENT:  Mouth/Throat: Oropharynx is clear and moist. No oropharyngeal exudate.  Cardiovascular: Normal rate, regular rhythm and normal heart sounds. Exam  reveals no gallop and no friction rub.  No murmur heard.  Pulmonary/Chest: Effort normal and breath sounds normal. No respiratory distress. He has no wheezes.  Abdominal: Soft. Bowel sounds are normal. He exhibits no distension. There is no tenderness.  Lymphadenopathy:  He has no cervical adenopathy.  Neurological: He is alert and oriented to person, place, and time.  Skin: Skin is warm and dry. No rash noted. No erythema.  Ext: right knee is warm, tender to touch Psychiatric: He has a normal mood and affect. His behavior is normal.     Lab Results  Recent Labs  04/22/15 0115 04/23/15 0526  WBC 12.3* 11.9*  HGB 13.9 13.1  HCT 41.5 39.4  NA 131* 134*  K 4.1 3.7  CL 94* 101  CO2 27 26  BUN 29* 26*  CREATININE 0.91 0.73   Liver Panel  Recent Labs  04/22/15 0115 04/23/15 0526  PROT 5.6* 5.0*  ALBUMIN 2.3* 1.9*  AST 58* 66*  ALT 39 48  ALKPHOS 134* 123  BILITOT 1.7* 1.1   Sedimentation Rate  Recent Labs  04/23/15 0526  ESRSEDRATE 67*   C-Reactive Protein  Recent Labs  04/23/15 0526  CRP 27.2*    Microbiology: 11/14 arthrocentesis PsA (sensitivities pending) 11/14 blood cx pending Studies/Results: Ct Knee Right Wo  Contrast  04/21/2015  CLINICAL DATA:  Twisting injury to right knee while working in garden. Unable to bear weight, with swelling extending to the foot. Initial encounter. EXAM: CT OF THE RIGHT KNEE WITHOUT CONTRAST TECHNIQUE: Multidetector CT imaging of the right knee was performed according to the standard protocol. Multiplanar CT image reconstructions were also generated. COMPARISON:  None. FINDINGS: There is significant lucency noted about the tibial plateau, underlying the prosthesis. This may reflect some degree of chronic loosening about the plate. There is also lucency underlying the femoral component of the prosthesis, concerning for loosening, and minimally underlying the patellofemoral compartment. No definite acute fracture is seen. Note  is made of a moderate complex knee joint effusion, with mild loculation. This is markedly thick walled. The appearance suggests chronic synovial inflammation, though underlying infection cannot be excluded. A tiny focus of air within the collection reflects recent aspiration of fluid. Diffuse soft tissue edema is noted tracking along the right knee and lower leg. Varices are seen along the right lower leg. Diffuse vascular calcifications are noted. IMPRESSION: 1. Significant lucency about the tibial plateau, underlying the prostheses. This may reflect some degree of chronic loosening about the plate. Lucency underlying the femoral component of the prosthesis, and minimally underlying the patellofemoral component, also concerning for loosening. No definite acute fracture seen. 2. Moderate complex knee joint effusion, with mild loculation. This is markedly thick walled. The appearance suggests chronic synovial inflammation, though underlying infection cannot be excluded. Would correlate with lab findings from recently aspirated fluid. 3. Diffuse soft tissue edema tracking along the right knee and lower leg. 4. Diffuse vascular calcifications seen. 5. Varices noted along the right lower leg. Electronically Signed   By: Garald Balding M.D.   On: 04/21/2015 21:16   Portable Chest 1 View  04/21/2015  CLINICAL DATA:  Right knee pain and swelling, worsening over the last 4 days. Cough. Elevated white count. EXAM: PORTABLE CHEST 1 VIEW COMPARISON:  03/25/2009 FINDINGS: There has been previous median sternotomy and CABG. The heart is enlarged. There is venous hypertension with interstitial edema worrisome for congestive heart failure. Patchy density at the lung bases could also represent pneumonia. No effusions. Bony structures are unremarkable. IMPRESSION: Interval CABG. Cardiomegaly. Pulmonary venous hypertension with interstitial edema. Patchy density at the bases could relate to edema and atelectasis or could  represent mild basilar pneumonia. Electronically Signed   By: Nelson Chimes M.D.   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 We will plan to do medical management regardless if he has surgery or not. That being said, Pseudomonal infection is difficult to be succesfully treated without surgery. Per family, it appears that Dr. Ronnie Derby is planning on doing a 1-staged revision where old components are removed, washout, and new implant done in one procedure. - without surgery, the risk of cure is likely in the 30-40% chance and would still require chronic suppression for lifelong to keep infection localized at knee. He would have poor quality of life since he does suffer from significant pain due to the prosthesis malfunction after all these years.  - awaiting sensitivity testing to see if cipro would also be an option for suppression after 6 wk of Iv antibiotics but it does increase risk of cdiff in a 81 yo - recommend to get palliative care consultation to help with goals of care if  patient is too high risk for surgery  Baxter Flattery [facility] for Infectious Diseases Cell: [phone] Pager: [phone]  04/23/2015, 1:55 PM

## 2015-04-23 NOTE — Progress Notes (Signed)
  Echocardiogram 2D Echocardiogram has been performed.  Anthony Osborn M 04/23/2015, 9:56 AM

## 2015-04-23 NOTE — Anesthesia Preprocedure Evaluation (Deleted)
Anesthesia Evaluation  Patient identified by MRN, date of birth, ID band Patient awake  General Assessment Comment: [name] is a pleasant 81 y.o. male with PMH significant for hypertension, hyperlipidemia, prostate cancer, hyperthyroidism, CAD, s/p of CABG, who presents today for a right total knee revision by Dr. Vickey Huger.  Reviewed: Allergy & Precautions, NPO status , Patient's Chart, lab work & pertinent test results, reviewed documented beta blocker date and time   Airway        Dental   Pulmonary           Cardiovascular Exercise Tolerance: Poor hypertension, Pt. on medications and Pt. on home beta blockers + CAD and + CABG    CAD s/p triple-vessel CABG on 03/17/2011    Neuro/Psych    GI/Hepatic negative GI ROS, Neg liver ROS,   Endo/Other  Hyperthyroidism   Renal/GU negative Renal ROS    Hx of prostate cancer (3 years ago)    Musculoskeletal  (+) Arthritis , Osteoarthritis,  Septic arthritis of knee, right    Abdominal   Peds  Hematology   Anesthesia Other Findings   Reproductive/Obstetrics                          Echocardiogram was done today which showed EF [id number], grade 2 diastolic dysfunction, akinesis of anteroseptal myocardium, mild MR, PA peak pressure 59 mmHg.  Lab Results  Component Value Date   WBC 11.9* 04/23/2015   HGB 13.1 04/23/2015   HCT 39.4 04/23/2015   MCV 93.4 04/23/2015   PLT 198 04/23/2015     Chemistry      Component Value Date/Time   NA 134* 04/23/2015 0526   K 3.7 04/23/2015 0526   CL 101 04/23/2015 0526   CO2 26 04/23/2015 0526   BUN 26* 04/23/2015 0526   CREATININE 0.73 04/23/2015 0526      Component Value Date/Time   CALCIUM 8.3* 04/23/2015 0526   ALKPHOS 123 04/23/2015 0526   AST 66* 04/23/2015 0526   ALT 48 04/23/2015 0526   BILITOT 1.1 04/23/2015 0526     BP Readings from Last 3 Encounters:  04/23/15 126/51    Anesthesia  Physical Anesthesia Plan Anesthesia Quick Evaluation

## 2015-04-23 NOTE — H&P (Signed)
Vivaan Busbin MRN:  [id number] DOB/SEX:  [birth year]/male  CHIEF COMPLAINT:  Painful right Knee  HISTORY: Patient is a 81 y.o. male presented with a history of pain in the right knee. Onset of symptoms was abrupt starting several days ago with a fall. Prior procedures on the knee include arthroplasty. Patient currently rates pain in the knee at 9 out of 10 with activity. There is pain at night.  PAST MEDICAL HISTORY: Patient Active Problem List   Diagnosis Date Noted  . Septic joint of right knee joint ([place]) 04/21/2015  . CAD (coronary artery disease) 04/21/2015  . Septic arthritis of knee, right ([place]) 04/21/2015  . Cough 04/21/2015  . Hypertension   . Hyperlipidemia   . Prostate cancer ([place])   . Hyperthyroidism    Past Medical History  Diagnosis Date  . Hypertension   . Hyperlipidemia   . Prostate cancer ([place])     3 YEARS AGO(BEING OBSERVED)  . Hyperthyroidism   . CAD (coronary artery disease)    Past Surgical History  Procedure Laterality Date  . Bilateral knee arthroscopy    . Rotator cuff repair      LEFT  . Cholecystectomy    . Coronary artery bypass graft       MEDICATIONS:   Prescriptions prior to admission  Medication Sig Dispense Refill Last Dose  . acetaminophen (TYLENOL) 500 MG tablet Take 500 mg by mouth every 6 (six) hours as needed for moderate pain.   04/21/2015 at Unknown time  . aspirin 81 MG tablet Take 81 mg by mouth daily.     04/21/2015 at Unknown time  . CALCIUM-MAGNESIUM-ZINC PO Take 1 tablet by mouth 3 (three) times daily.    04/21/2015 at Unknown time  . carvedilol (COREG) 3.125 MG tablet Take 3.125 mg by mouth 2 (two) times daily.   04/21/2015 at Unknown time  . methimazole (TAPAZOLE) 10 MG tablet Take 5 mg by mouth daily.   04/21/2015 at Unknown time  . simvastatin (ZOCOR) 10 MG tablet Take 10 mg by mouth at bedtime.     04/20/2015 at Unknown time  . carvedilol (COREG) 12.5 MG tablet Take 12.5 mg by mouth 2 (two) times daily.       .  Cholecalciferol (VITAMIN D-3 PO) Take 1,000 Units by mouth daily.       Marland Kitchen lisinopril-hydrochlorothiazide (PRINZIDE,ZESTORETIC) 10-12.5 MG per tablet Take 1 tablet by mouth daily.         ALLERGIES:  No Known Allergies  REVIEW OF SYSTEMS:  Pertinent items are noted in HPI.   FAMILY HISTORY:   Family History  Problem Relation Age of Onset  . Stroke Father   . Heart disease Brother   . Diabetes Sister     SOCIAL HISTORY:   Social History  Substance Use Topics  . Smoking status: Never Smoker   . Smokeless tobacco: Not on file  . Alcohol Use: No     EXAMINATION:  Vital signs in last 24 hours: Temp:  [97.6 F (36.4 C)-101.5 F (38.6 C)] 97.6 F (36.4 C) (11/16 0542) Pulse Rate:  [75-95] 75 (11/16 0542) Resp:  [17-18] 18 (11/16 0542) BP: (122-131)/(51-65) 126/51 mmHg (11/16 0542) SpO2:  [94 %-96 %] 94 % (11/16 0542) Weight:  [78.2 kg (172 lb 6.4 oz)] 78.2 kg (172 lb 6.4 oz) (11/15 2112)  General appearance: alert, cooperative and no distress Lungs: clear to auscultation bilaterally Heart: regular rate and rhythm, S1, S2 normal, no murmur, click, rub or gallop Abdomen: soft, non-tender;  bowel sounds normal; no masses,  no organomegaly Pulses: 2+ and symmetric Skin: Skin color, texture, turgor normal. No rashes or lesions Neurologic: Alert and oriented X 3, normal strength and tone. Normal symmetric reflexes. Normal coordination and gait  Musculoskeletal:  ROM 0-90, Ligaments intact, edema, redness Imaging Review Plain radiographs demonstrate loosening of TKA components  Assessment/Plan: Right infected, loose TKA  Right knee I&D and total knee revision.  Plan discussed with family.  Lorie Melichar 04/23/2015, 8:09 AM

## 2015-04-23 NOTE — Consult Note (Signed)
CARDIOLOGY CONSULT NOTE   Patient ID: Anthony Osborn MRN: [id number], DOB/AGE: 1919/12/13   Admit date: 04/21/2015 Date of Consult: 04/23/2015   Primary Physician: Anthony Freeze, MD Primary Cardiologist: Dr. Jyl Osborn at [facility]  Pt. Profile  81 year old Caucasian male was past medical history of hypertension, hyperlipidemia, hypothyroidism, and history of CAD s/p triple-vessel CABG on 03/17/2011 presented with septic R knee prosthesis. Cardiology consulted for preop clearance.   Problem List  Past Medical History  Diagnosis Date  . Hypertension   . Hyperlipidemia   . Prostate cancer ([place])     3 YEARS AGO(BEING OBSERVED)  . Hyperthyroidism   . CAD (coronary artery disease)     Past Surgical History  Procedure Laterality Date  . Bilateral knee arthroscopy    . Rotator cuff repair      LEFT  . Cholecystectomy    . Coronary artery bypass graft       Allergies  No Known Allergies  HPI   The patient is a pleasant 81 year old Caucasian male with PMH of HTN, HLD, h/o prostate CA, hypothyroidism, and history of CAD s/p triple-vessel CABG on 03/17/2011. He also had bilateral knee replacement roughly 20 years ago. He had prostate cancer which was treated 4-5 years ago. According to the family member, recent PSA test is elevated at 15 (normal <4), and patient's doctor is planning for treatment if PSA level was greater than 20. Apparently he did not have any prior angina before his CABG. What happened was patient presented for a left arm surgery, and surgeon refused to operated on him due to drop in heart rate. He subsequently underwent a cardiac workup which noted he had several blocked artery. He underwent three-vessel CABG at [facility] on 03/17/2011 and had not had any issue since. He follows up with his cardiologist Dr. Geraldo Osborn in [place]. His last follow-up was earlier this year. He could not recall if he ever had a previous echocardiogram. Otherwise he  has been doing well and appears to be quite active despite his age. He does have occasional chest discomfort which he attributed to "gas", however he denies any recent onset of such symptom despite being active. He did have a fall roughly 4-5 years ago, however denies any recent trauma to the leg. After having bilateral knee replacement surgery with prosthesis more than 20 years ago, he has been doing quite well.  In the last few days, he started having right knee swelling and pain. The onset of knee pain interfered with his ability to ambulate. Per family, his change in functional status is sudden onset and related to the knee and he was active even a few days ago. He denies any prior history of orthopnea and paroxysmal nocturnal dyspnea. He presented to [facility] on 04/21/2015 for evaluation of right knee pain. His orthopedic surgeon did aspiration removed 75 mL synovial fluid. Synovial tissue synovial fluid analysis showed elevated white cell count. Patient was also febrile with a MAXIMUM TEMPERATURE of 101.5. Orthopedic surgery was consulted and recommended right knee incision and drainage with total knee revision. Of note knee aspiration culture grew pseudomonas aeruginosa. Blood cultures currently pending. Infectious disease is on board. CRP is elevated at 27.2. White blood cell count mildly elevated at 11.9. Given the patient's extensive history of coronary artery disease, cardiology has been consulted for preoperative clearance. Of note, I have requested records from [facility] where patient had the previous CABG in 2012 and also from patient's primary  cardiologist in [place]. Echocardiogram was done today which showed EF [id number], grade 2 diastolic dysfunction, akinesis of anteroseptal myocardium, mild MR, PA peak pressure 59 mmHg. Unfortunately, we could not find a prior record in our system to see if the cardiomyopathy is new versus chronic.   Inpatient Medications  .  aspirin EC  81 mg Oral Daily  . bupivacaine liposome  20 mL Infiltration To SS-Surg  . calcium carbonate  1 tablet Oral BID WC  . carvedilol  3.125 mg Oral BID  . cefTAZidime (FORTAZ)  IV  2 g Intravenous Q8H  . cholecalciferol  1,000 Units Oral Daily  . enoxaparin (LOVENOX) injection  40 mg Subcutaneous Q24H  . guaiFENesin  600 mg Oral BID  . Influenza vac split quadrivalent PF  0.5 mL Intramuscular Tomorrow-1000  . lisinopril  10 mg Oral Daily  . magnesium oxide  200 mg Oral BID  . methimazole  5 mg Oral Daily  . pneumococcal 23 valent vaccine  0.5 mL Intramuscular Tomorrow-1000  . simvastatin  10 mg Oral QHS    Family History Family History  Problem Relation Age of Onset  . Stroke Father   . Heart disease Brother   . Diabetes Sister      Social History Social History   Social History  . Marital Status: Married    Spouse Name: N/A  . Number of Children: N/A  . Years of Education: N/A   Occupational History  . Not on file.   Social History Main Topics  . Smoking status: Never Smoker   . Smokeless tobacco: Not on file  . Alcohol Use: No  . Drug Use: Not on file  . Sexual Activity: Not on file   Other Topics Concern  . Not on file   Social History Narrative     Review of Systems  General:  No chills, night sweats or weight changes.  +fever Cardiovascular:  No chest pain, dyspnea on exertion, orthopnea, palpitations, paroxysmal nocturnal dyspnea. +R lower leg edema Dermatological: No rash, lesions/masses +R eye edema  Respiratory: No cough, dyspnea Urologic: No hematuria, dysuria Abdominal:   No nausea, vomiting, diarrhea, bright red blood per rectum, melena, or hematemesis Neurologic:  No visual changes, wkns, changes in mental status. R knee pain All other systems reviewed and are otherwise negative except as noted above.  Physical Exam  Blood pressure 126/51, pulse 75, temperature 97.6 F (36.4 C), temperature source Oral, resp. rate 18, height 6\' 1"   (1.854 m), weight 172 lb 6.4 oz (78.2 kg), SpO2 94 %.  General: Pleasant, NAD Psych: Normal affect. Neuro: Alert and oriented X 3. Moves all extremities spontaneously. HEENT: Normal  Neck: Supple without bruits or JVD. Lungs:  Resp regular and unlabored, CTA. Heart: RRR no s3, s4, or murmurs. Abdomen: Soft, non-tender, non-distended, BS + x 4.  Extremities: No clubbing, cyanosis. DP/PT/Radials 2+ and equal bilaterally. R knee swelling and pain on palpation.   Labs  No results for input(s): CKTOTAL, CKMB, TROPONINI in the last 72 hours. Lab Results  Component Value Date   WBC 11.9* 04/23/2015   HGB 13.1 04/23/2015   HCT 39.4 04/23/2015   MCV 93.4 04/23/2015   PLT 198 04/23/2015    Recent Labs Lab 04/23/15 0526  NA 134*  K 3.7  CL 101  CO2 26  BUN 26*  CREATININE 0.73  CALCIUM 8.3*  PROT 5.0*  BILITOT 1.1  ALKPHOS 123  ALT 48  AST 66*  GLUCOSE 113*   Lab Results  Component Value Date   CHOL 99 04/22/2015   HDL 22* 04/22/2015   LDLCALC 59 04/22/2015   TRIG 89 04/22/2015    Radiology/Studies  Ct Knee Right Wo Contrast  04/21/2015  CLINICAL DATA:  Twisting injury to right knee while working in garden. Unable to bear weight, with swelling extending to the foot. Initial encounter. EXAM: CT OF THE RIGHT KNEE WITHOUT CONTRAST TECHNIQUE: Multidetector CT imaging of the right knee was performed according to the standard protocol. Multiplanar CT image reconstructions were also generated. COMPARISON:  None. FINDINGS: There is significant lucency noted about the tibial plateau, underlying the prosthesis. This may reflect some degree of chronic loosening about the plate. There is also lucency underlying the femoral component of the prosthesis, concerning for loosening, and minimally underlying the patellofemoral compartment. No definite acute fracture is seen. Note is made of a moderate complex knee joint effusion, with mild loculation. This is markedly thick walled. The  appearance suggests chronic synovial inflammation, though underlying infection cannot be excluded. A tiny focus of air within the collection reflects recent aspiration of fluid. Diffuse soft tissue edema is noted tracking along the right knee and lower leg. Varices are seen along the right lower leg. Diffuse vascular calcifications are noted. IMPRESSION: 1. Significant lucency about the tibial plateau, underlying the prostheses. This may reflect some degree of chronic loosening about the plate. Lucency underlying the femoral component of the prosthesis, and minimally underlying the patellofemoral component, also concerning for loosening. No definite acute fracture seen. 2. Moderate complex knee joint effusion, with mild loculation. This is markedly thick walled. The appearance suggests chronic synovial inflammation, though underlying infection cannot be excluded. Would correlate with lab findings from recently aspirated fluid. 3. Diffuse soft tissue edema tracking along the right knee and lower leg. 4. Diffuse vascular calcifications seen. 5. Varices noted along the right lower leg. Electronically Signed   By: Garald Balding M.D.   On: 04/21/2015 21:16   Portable Chest 1 View  04/21/2015  CLINICAL DATA:  Right knee pain and swelling, worsening over the last 4 days. Cough. Elevated white count. EXAM: PORTABLE CHEST 1 VIEW COMPARISON:  03/25/2009 FINDINGS: There has been previous median sternotomy and CABG. The heart is enlarged. There is venous hypertension with interstitial edema worrisome for congestive heart failure. Patchy density at the lung bases could also represent pneumonia. No effusions. Bony structures are unremarkable. IMPRESSION: Interval CABG. Cardiomegaly. Pulmonary venous hypertension with interstitial edema. Patchy density at the bases could relate to edema and atelectasis or could represent mild basilar pneumonia. Electronically Signed   By: Nelson Chimes M.D.   On: 04/21/2015 23:18     ECG  Normal sinus rhythm with left anterior fascicular block, first-degree AV block, occasional PACs.  ASSESSMENT AND PLAN  1. Preoperative clearance  - it appears patient will need surgery to help definitively treat septic R knee, question is whether the LV dysfunction is old vs new. He did not have obvious anginal symptom prior to previous CABG, and it was found incidentally when he went for another surgery, he does have occasional "gas" pain   - his advanced age probably will make him high risk regardless, he will likely need knee surgery either way to prevent bacteremia, will defer to ID and ortho. I will obtain record from [facility] and his primary cardiologist office   2. Septic R knee: Knee aspiration culture grew pseudomonas aeruginosa, blood culture currently pending  3. LV dysfunction: unclear if old vs new  -  no obvious HF symptom, appears to be euvolemic  4. CAD s/p 3v CABG on 03/17/2011 at [facility]  5. H/o prostate CA: recent PSA 15 (normal is <4), per family, patient doctor is waiting for PSA >20 before treatment  6. HTN 7. HLD 8. Hypothyroidism  Signed, Almyra Deforest, PA-C 04/23/2015, 12:22 PM   I have personally seen and examined this patient with Almyra Deforest, PA-C. I agree with the assessment and plan as outlined above. He is admitted with a septic right knee prosthesis. We are asked to consult to outline his cardiac risk with potential surgical correction of his septic knee. He is followed in the cardiology office in [facility]. We have no old records. LVEF by echo here is 25-30%. It is not clear if this is old or new.  I have had a long discussion with the patient and his family regarding cardiac risk. Given his advanced age and underlying CAD with prior CABG, he will be at least moderate risk with his orthopedic procedure. We will get records from [facility] but even if his LV dysfunction is new, I would not recommend invasive cardiac evaluation. He has no angina and  is very active at baseline. The patient and family would like to hold off on surgery today until ID is able to weigh in on the feasibility of antibiotic therapy. If the treating team (ortho, ID, Hospitalists) decided that he needs to have the surgery, I would proceed without further cardiac workup knowing that he is at moderate risk for cardiac complications with general anesthesia. The patient and family are willing to accept this risk if necessary.   Latoria Dry 04/23/2015 1:43 PM

## 2015-04-24 ENCOUNTER — Other Ambulatory Visit: Payer: Self-pay | Admitting: Orthopedic Surgery

## 2015-04-24 DIAGNOSIS — I255 Ischemic cardiomyopathy: Secondary | ICD-10-CM

## 2015-04-24 DIAGNOSIS — T8453XA Infection and inflammatory reaction due to internal right knee prosthesis, initial encounter: Secondary | ICD-10-CM | POA: Diagnosis not present

## 2015-04-24 DIAGNOSIS — M009 Pyogenic arthritis, unspecified: Secondary | ICD-10-CM | POA: Diagnosis present

## 2015-04-24 DIAGNOSIS — I10 Essential (primary) hypertension: Secondary | ICD-10-CM | POA: Diagnosis present

## 2015-04-24 DIAGNOSIS — Z515 Encounter for palliative care: Secondary | ICD-10-CM

## 2015-04-24 DIAGNOSIS — M00861 Arthritis due to other bacteria, right knee: Secondary | ICD-10-CM

## 2015-04-24 LAB — CULTURE, BODY FLUID W GRAM STAIN -BOTTLE

## 2015-04-24 LAB — CULTURE, BODY FLUID-BOTTLE

## 2015-04-24 LAB — GLUCOSE, CAPILLARY: GLUCOSE-CAPILLARY: 136 mg/dL — AB (ref 65–99)

## 2015-04-24 MED ORDER — TRANEXAMIC ACID 1000 MG/10ML IV SOLN
2000.0000 mg | INTRAVENOUS | Status: DC
  Filled 2015-04-24: qty 20

## 2015-04-24 MED ORDER — FUROSEMIDE 10 MG/ML IJ SOLN
40.0000 mg | Freq: Once | INTRAMUSCULAR | Status: AC
  Administered 2015-04-24: 40 mg via INTRAVENOUS
  Filled 2015-04-24: qty 4

## 2015-04-24 MED ORDER — SODIUM CHLORIDE 0.9 % IV SOLN
INTRAVENOUS | Status: DC
  Administered 2015-04-25 – 2015-04-28 (×4): via INTRAVENOUS

## 2015-04-24 MED ORDER — SENNOSIDES-DOCUSATE SODIUM 8.6-50 MG PO TABS
1.0000 | ORAL_TABLET | Freq: Two times a day (BID) | ORAL | Status: DC
  Administered 2015-04-24 – 2015-04-30 (×11): 1 via ORAL
  Filled 2015-04-24 (×12): qty 1

## 2015-04-24 MED ORDER — CHLORHEXIDINE GLUCONATE 4 % EX LIQD
60.0000 mL | Freq: Once | CUTANEOUS | Status: DC
  Filled 2015-04-24: qty 60

## 2015-04-24 MED ORDER — CEFAZOLIN SODIUM-DEXTROSE 2-3 GM-% IV SOLR
2.0000 g | INTRAVENOUS | Status: DC
  Filled 2015-04-24: qty 50

## 2015-04-24 NOTE — Care Management Important Message (Signed)
Important Message  Patient Details  Name: Anthony Osborn MRN: [id number] Date of Birth: 03/24/1920   Medicare Important Message Given:  Yes    [name] [name] 04/24/2015, 12:20 PM

## 2015-04-24 NOTE — Progress Notes (Addendum)
SUBJECTIVE: no chest pain or SOB.   BP 140/68 mmHg  Pulse 83  Temp(Src) 98.5 F (36.9 C) (Oral)  Resp 19  Ht 6\' 1"  (1.854 m)  Wt 173 lb 4.5 oz (78.6 kg)  BMI 22.87 kg/m2  SpO2 95%  Intake/Output Summary (Last 24 hours) at 04/24/15 0806 Last data filed at 04/24/15 0601  Gross per 24 hour  Intake      0 ml  Output    650 ml  Net   -650 ml    PHYSICAL EXAM General: Well developed, well nourished, in no acute distress. Alert and oriented x 3.  Psych:  Good affect, responds appropriately Neck: No JVD. No masses noted.  Lungs: Clear bilaterally with no wheezes or rhonci noted.  Heart: RRR with no murmurs noted. Abdomen: Bowel sounds are present. Soft, non-tender.  Extremities: No lower extremity edema.   LABS: Basic Metabolic Panel:  Recent Labs  04/22/15 0115 04/23/15 0526  NA 131* 134*  K 4.1 3.7  CL 94* 101  CO2 27 26  GLUCOSE 128* 113*  BUN 29* 26*  CREATININE 0.91 0.73  CALCIUM 8.7* 8.3*   CBC:  Recent Labs  04/21/15 1951 04/22/15 0115 04/23/15 0526  WBC 13.6* 12.3* 11.9*  NEUTROABS 10.0*  --   --   HGB 13.8 13.9 13.1  HCT 42.0 41.5 39.4  MCV 92.7 93.0 93.4  PLT 192 204 198   Fasting Lipid Panel:  Recent Labs  04/22/15 0115  CHOL 99  HDL 22*  LDLCALC 59  TRIG 89  CHOLHDL 4.5    Current Meds: . aspirin EC  81 mg Oral Daily  . bupivacaine liposome  20 mL Infiltration To SS-Surg  . calcium carbonate  1 tablet Oral BID WC  . carvedilol  3.125 mg Oral BID  . cefTAZidime (FORTAZ)  IV  2 g Intravenous Q8H  . cholecalciferol  1,000 Units Oral Daily  . enoxaparin (LOVENOX) injection  40 mg Subcutaneous Q24H  . guaiFENesin  600 mg Oral BID  . Influenza vac split quadrivalent PF  0.5 mL Intramuscular Tomorrow-1000  . lisinopril  10 mg Oral Daily  . magnesium oxide  200 mg Oral BID  . methimazole  5 mg Oral Daily  . pneumococcal 23 valent vaccine  0.5 mL Intramuscular Tomorrow-1000  . simvastatin  10 mg Oral QHS     ASSESSMENT AND  PLAN: 81 yo male admitted with septic right knee, known CAD s/p CABG, LV systolic dysfunction noted here on pre-op echo. His CABG was in [facility] and his f/u has been with Dr. Lennox Pippins.   1. Preoperative cardiac risk assessment: We are asked to consult to outline his cardiac risk with potential surgical correction of his septic prosthetic right knee. He is followed in the cardiology office in [facility]. Last echo in 2012 showed LVEF=35% so his LV dysfunction is chronic. LVEF by echo here is 25-30%. I have had a long discussion with the patient and his family regarding cardiac risk. Given his advanced age and underlying CAD with prior CABG, he is at moderate risk with his orthopedic procedure. He has no angina and is very active at baseline. While his risk for any operative procedure with general anesthesia is moderate, I would proceed with the planned procedure if it is felt to be the best treatment option for his septic joint.   2. Septic R knee: Knee aspiration culture grew pseudomonas aeruginosa, blood culture currently pending  3. Chronic LV systolic  dysfunction: He is euvolemic.   4. CAD: s/p 3v CABG on 03/17/2011 at [facility]: no angina.    Anthony Osborn  11/17/20168:06 AM

## 2015-04-24 NOTE — Progress Notes (Signed)
Physical Therapy Treatment Patient Details Name: Anthony Osborn MRN: [id number] DOB: 1919-08-31 Today's Date: 04/24/2015    History of Present Illness 81 yo male admitted with R knee swelling and pain. pt with cough and mild fever. PMH: HTN Hyperlipidemia, remote CA prostate, Hypothryoidism, CAD s/p CABG    PT Comments    Making progress today. Better tolerance with weight-bearing and less assist for transfer to chair. Tolerated exercises well. Highly motivated. Patient will continue to benefit from skilled physical therapy services to further improve independence with functional mobility.   Follow Up Recommendations  SNF;Supervision/Assistance - 24 hour     Equipment Recommendations  Rolling walker with 5" wheels    Recommendations for Other Services       Precautions / Restrictions Precautions Precautions: Fall Precaution Comments: Rt knee painful with movement Restrictions Weight Bearing Restrictions: No    Mobility  Bed Mobility Overal bed mobility: Needs Assistance Bed Mobility: Rolling;Sidelying to Sit Rolling: Min assist Sidelying to sit: Mod assist;HOB elevated       General bed mobility comments: Min assist to roll onto Lt side with cues for technique and use of rail. Mod assist for truncal support to rise to EOB.  Transfers Overall transfer level: Needs assistance Equipment used: Rolling walker (2 wheeled) Transfers: Sit to/from Stand Sit to Stand: Max assist;+2 physical assistance Stand pivot transfers: +2 physical assistance;Mod assist       General transfer comment: Max assist for first trial of standing with +2 assist. VC for hand placement and to rock for momentum. Mod assist +2 for second attempt with better boost. Mod assist for walker control with pivot to chair. Very antalgic but no buckling, limited weight bearing tolerated.   Ambulation/Gait                 Stairs            Wheelchair Mobility    Modified Rankin (Stroke  Patients Only)       Balance                                    Cognition Arousal/Alertness: Awake/alert Behavior During Therapy: WFL for tasks assessed/performed Overall Cognitive Status: Within Functional Limits for tasks assessed                      Exercises General Exercises - Lower Extremity Ankle Circles/Pumps: AROM;Both;20 reps;Seated Quad Sets: Strengthening;Both;10 reps;Seated    General Comments        Pertinent Vitals/Pain Pain Assessment: Faces Faces Pain Scale: Hurts little more Pain Location: Rt knee with movement Pain Descriptors / Indicators: Grimacing;Guarding Pain Intervention(s): Monitored during session;Repositioned    Home Living Family/patient expects to be discharged to:: Skilled nursing facility Living Arrangements: Spouse/significant other Available Help at Discharge: Family;Available 24 hours/day Type of Home: House Home Access: Stairs to enter Entrance Stairs-Rails: None Home Layout: One level Home Equipment: Wheelchair - manual;Shower seat Additional Comments: Pt reports having flight of stairs to basement, however he has been unable to access basement due to Rt knee pain    Prior Function Level of Independence: Independent      Comments: Prior to admission pt was independent with all ADLs. Pt was driving and doing yard work.   PT Goals (current goals can now be found in the care plan section) Acute Rehab PT Goals Patient Stated Goal: to not have pain PT Goal Formulation: With patient  Time For Goal Achievement: 05/06/15 Potential to Achieve Goals: Good Progress towards PT goals: Progressing toward goals    Frequency  Min 3X/week    PT Plan Current plan remains appropriate    Co-evaluation             End of Session Equipment Utilized During Treatment: Gait belt Activity Tolerance: Patient limited by pain Patient left: in chair;with call bell/phone within reach;with family/visitor present      Time: [id number] PT Time Calculation (min) (ACUTE ONLY): 20 min  Charges:  $Therapeutic Activity: 8-22 mins                    G Codes:      Ellouise Newer 04-28-15, 4:27 PM Camille Bal [place], [place]

## 2015-04-24 NOTE — Progress Notes (Signed)
[facility] PROGRESS NOTE  Anthony Osborn [id number] DOB: 1920/03/09 DOA: 04/21/2015 PCP: Cyndy Freeze, MD  Length of stay: 3   Assessment/Plan: Principal Problem:   Septic joint of right knee joint ([place]) Active Problems:   Hypertension   Hyperlipidemia   Prostate cancer ([place])   CAD (coronary artery disease)   Septic arthritis of knee, right (HCC)   Cough   Hyperthyroidism    Septic joint of right knee joint [facility]): Synovial fluid showed WBC 69330 without Crystal, plus leukocytosis and mild fever, consistent with septic joint. Patient does not meet criteria for sepsis, hemodynamically stable. plan to continue with fortaz x 6 wk. We will plan to do medical management regardless if he has surgery or not. Per ID , Pseudomonal infection is difficult to be succesfully treated without surgery.   Dr. Ronnie Derby is planning on doing a 1-staged revision where old components are removed, washout, and new implant done in one procedure Pseudomonas is sensitive to Cipro, cefepime, Fortaz Blood culture no growth so far Normal pro calcitonin and lactic acid Nothing by mouth after midnight for surgery tomorrow Per cardiology his risk for any operative procedure with general anesthesia is moderate, cardiology recommends to   proceed with the planned procedure if it is felt to be the best treatment option for his septic joint.  Dr. Ronnie Derby updated about cardiology/infectious disease recommendations   Chronic systolic heart failure without exacerbation 2-D echo shows LV EF: 25% -  30%o , last echo in 2012 showed EF of 35% Patient refuses lisinopril this morning, however recommended that he may need this prior to discharge EKG shows PVCs with LVH, left anterior fascicular block    Cough: Unclear etiology Chest x-ray pneumonia versus fluid, patient already receiving antibiotics as above Result  Hypertension: Hold lisinopril 10 mg daily,-continue Coreg  HLD:   LDL  59 -Continue home  medications: Zocor    CAD (coronary artery disease): s/p of CABG. No CP. -on ASA, coreg and zocor  Hyperthyroidism: No TSH are recorded. -Continue methimazole -Check TSH  History of prostrate cancer UA negative   DVT prophylaxsis  Lovenox  Code Status:      Code Status Orders        Start     Ordered   04/21/15 2234  Full code   Continuous     04/21/15 2234    Advance Directive Documentation        Most Recent Value   Type of Advance Directive  Healthcare Power of Attorney   Pre-existing out of facility DNR order (yellow form or pink MOST form)     "MOST" Form in Place?       Family Communication: family updated about patient's clinical progress Disposition Plan:  The timing of surgery to be determined by Dr. Ronnie Derby   Brief narrative: Anthony Osborn is a 81 y.o. male with PMH of hypertension, hyperlipidemia, remote prostate cancer, hypothyroidism, CAD, s/p of CABG, who presents with right knee swelling and pain.  The patient reports that he had a minor injury to his R knee at one year. In the past 4 days, he has been having swelling and pain over right knee joint. He also has mild fever, chills and sweating. He was seen by orthopedic surgeon, Dr. Leona Carry, who did aspiration and removed 75 mL of synovial fluid. Initial analysis showed no crystals, but with WBC 69330. Patient reports that he has mild cough with the yellow colored sputum production, no chest pain or  shortness of breath. No abdominal pain, diarrhea, symptoms of UTI, unilateral weakness.  In ED, patient was found to have WBC 13.6, temperature 99.9, heart rate 95, electrolytes okay, renal functioning okay, lactate 1.7. Patient admitted to inpatient for further evaluation and treatment. Orthopedic surgeon, Dr. Percell Miller was consulted by ED.  CT of the right knee showed significant lucency about the tibial plateau, underlying the prostheses. This may reflect some degree of chronic loosening about the plate. Lucency  underlying the femoral component of the prosthesis, and minimally underlying the patellofemoral component, also concerning for loosening. No definite acute fracture seen. moderate complex knee joint effusion, with mild loculation. This is markedly thick walled. The appearance suggests chronic synovial inflammation, though underlying infection cannot be excluded. Diffuse soft tissue edema tracking along the right knee and lower leg.Diffuse vascular calcifications seen. Varices noted along the right lower leg.    Consultants:  Orthopedics  Procedures:  None  Antibiotics: Anti-infectives    Start     Dose/Rate Route Frequency Ordered Stop   04/23/15 1200  cefTAZidime (FORTAZ) 2 g in dextrose 5 % 50 mL IVPB     2 g 100 mL/hr over 30 Minutes Intravenous Every 8 hours 04/23/15 1122     04/22/15 2200  vancomycin (VANCOCIN) 1,250 mg in sodium chloride 0.9 % 250 mL IVPB  Status:  Discontinued     1,250 mg 166.7 mL/hr over 90 Minutes Intravenous Every 24 hours 04/21/15 2246 04/22/15 1419   04/21/15 2245  vancomycin (VANCOCIN) 1,750 mg in sodium chloride 0.9 % 500 mL IVPB     1,750 mg 250 mL/hr over 120 Minutes Intravenous  Once 04/21/15 2237 04/22/15 0309   04/21/15 2245  cefTRIAXone (ROCEPHIN) 2 g in dextrose 5 % 50 mL IVPB  Status:  Discontinued     2 g 100 mL/hr over 30 Minutes Intravenous Every 24 hours 04/21/15 2238 04/23/15 1122   04/21/15 2045  ceFAZolin (ANCEF) IVPB 1 g/50 mL premix     1 g 100 mL/hr over 30 Minutes Intravenous  Once 04/21/15 2038 04/21/15 2132         HPI/Subjective: Afebrile, hemodynamically stable overnight   Objective: Filed Vitals:   04/23/15 1700 04/23/15 2029 04/24/15 0518 04/24/15 0900  BP: 122/60 132/69 140/68 135/57  Pulse: 76 85 83 81  Temp: 98 F (36.7 C) 98.3 F (36.8 C) 98.5 F (36.9 C) 98.2 F (36.8 C)  TempSrc: Oral Oral Oral Oral  Resp:  20 19 20   Height:      Weight:  78.6 kg (173 lb 4.5 oz)    SpO2: 96% 96% 95% 95%     Intake/Output Summary (Last 24 hours) at 04/24/15 1023 Last data filed at 04/24/15 0855  Gross per 24 hour  Intake    360 ml  Output    870 ml  Net   -510 ml    Exam:  General: No acute respiratory distress Lungs: Clear to auscultation bilaterally without wheezes or crackles Cardiovascular: Regular rate and rhythm without murmur gallop or rub normal S1 and S2 Abdomen: Nontender, nondistended, soft, bowel sounds positive, no rebound, no ascites, no appreciable mass Musculoskeletal: Right knee is tender, warm and swollen. No right knee deformity. Normal distal sensation     Data Review   Micro Results Recent Results (from the past 240 hour(s))  Culture, body fluid-bottle     Status: None   Collection Time: 04/21/15  8:02 PM  Result Value Ref Range Status   Specimen Description FLUID RIGHT KNEE  Final   Special Requests NONE  Final   Gram Stain   Final    GRAM NEGATIVE RODS CRITICAL RESULT CALLED TO, READ BACK BY AND VERIFIED WITH: I. Jama RN 10:45 04/22/15 ([email or web address])    Culture PSEUDOMONAS AERUGINOSA  Final   Report Status 04/24/2015 FINAL  Final   Organism ID, Bacteria PSEUDOMONAS AERUGINOSA  Final      Susceptibility   Pseudomonas aeruginosa - MIC*    CEFTAZIDIME <=1 SENSITIVE Sensitive     CIPROFLOXACIN <=0.25 SENSITIVE Sensitive     GENTAMICIN <=1 SENSITIVE Sensitive     IMIPENEM 1 SENSITIVE Sensitive     PIP/TAZO <=4 SENSITIVE Sensitive     CEFEPIME <=1 SENSITIVE Sensitive     * PSEUDOMONAS AERUGINOSA  Gram stain     Status: None   Collection Time: 04/21/15  8:02 PM  Result Value Ref Range Status   Specimen Description FLUID RIGHT KNEE  Final   Special Requests NONE  Final   Gram Stain   Final    ABUNDANT WBC PRESENT, PREDOMINANTLY PMN NO ORGANISMS SEEN    Report Status 04/21/2015 FINAL  Final  Culture, blood (routine x 2)     Status: None (Preliminary result)   Collection Time: 04/21/15 10:50 PM  Result Value Ref Range Status   Specimen Description  BLOOD LEFT ARM  Final   Special Requests BOTTLES DRAWN AEROBIC AND ANAEROBIC 5CC  Final   Culture NO GROWTH 2 DAYS  Final   Report Status PENDING  Incomplete  Culture, blood (routine x 2)     Status: None (Preliminary result)   Collection Time: 04/21/15 11:00 PM  Result Value Ref Range Status   Specimen Description BLOOD LEFT FOREARM  Final   Special Requests BOTTLES DRAWN AEROBIC AND ANAEROBIC 10CC  Final   Culture NO GROWTH 2 DAYS  Final   Report Status PENDING  Incomplete    Radiology Reports Ct Knee Right Wo Contrast  04/21/2015  CLINICAL DATA:  Twisting injury to right knee while working in garden. Unable to bear weight, with swelling extending to the foot. Initial encounter. EXAM: CT OF THE RIGHT KNEE WITHOUT CONTRAST TECHNIQUE: Multidetector CT imaging of the right knee was performed according to the standard protocol. Multiplanar CT image reconstructions were also generated. COMPARISON:  None. FINDINGS: There is significant lucency noted about the tibial plateau, underlying the prosthesis. This may reflect some degree of chronic loosening about the plate. There is also lucency underlying the femoral component of the prosthesis, concerning for loosening, and minimally underlying the patellofemoral compartment. No definite acute fracture is seen. Note is made of a moderate complex knee joint effusion, with mild loculation. This is markedly thick walled. The appearance suggests chronic synovial inflammation, though underlying infection cannot be excluded. A tiny focus of air within the collection reflects recent aspiration of fluid. Diffuse soft tissue edema is noted tracking along the right knee and lower leg. Varices are seen along the right lower leg. Diffuse vascular calcifications are noted. IMPRESSION: 1. Significant lucency about the tibial plateau, underlying the prostheses. This may reflect some degree of chronic loosening about the plate. Lucency underlying the femoral component of the  prosthesis, and minimally underlying the patellofemoral component, also concerning for loosening. No definite acute fracture seen. 2. Moderate complex knee joint effusion, with mild loculation. This is markedly thick walled. The appearance suggests chronic synovial inflammation, though underlying infection cannot be excluded. Would correlate with lab findings from recently aspirated fluid. 3. Diffuse soft tissue edema tracking  along the right knee and lower leg. 4. Diffuse vascular calcifications seen. 5. Varices noted along the right lower leg. Electronically Signed   By: Garald Balding M.D.   On: 04/21/2015 21:16   Portable Chest 1 View  04/21/2015  CLINICAL DATA:  Right knee pain and swelling, worsening over the last 4 days. Cough. Elevated white count. EXAM: PORTABLE CHEST 1 VIEW COMPARISON:  03/25/2009 FINDINGS: There has been previous median sternotomy and CABG. The heart is enlarged. There is venous hypertension with interstitial edema worrisome for congestive heart failure. Patchy density at the lung bases could also represent pneumonia. No effusions. Bony structures are unremarkable. IMPRESSION: Interval CABG. Cardiomegaly. Pulmonary venous hypertension with interstitial edema. Patchy density at the bases could relate to edema and atelectasis or could represent mild basilar pneumonia. Electronically Signed   By: Nelson Chimes M.D.   On: 04/21/2015 23:18     CBC  Recent Labs Lab 04/21/15 1951 04/22/15 0115 04/23/15 0526  WBC 13.6* 12.3* 11.9*  HGB 13.8 13.9 13.1  HCT 42.0 41.5 39.4  PLT 192 204 198  MCV 92.7 93.0 93.4  MCH 30.5 31.2 31.0  MCHC 32.9 33.5 33.2  RDW 13.9 13.9 14.0  LYMPHSABS 2.0  --   --   MONOABS 1.6*  --   --   EOSABS 0.0  --   --   BASOSABS 0.0  --   --     Chemistries   Recent Labs Lab 04/21/15 1951 04/22/15 0115 04/23/15 0526  NA 132* 131* 134*  K 4.5 4.1 3.7  CL 97* 94* 101  CO2 25 27 26   GLUCOSE 109* 128* 113*  BUN 27* 29* 26*  CREATININE 0.85 0.91  0.73  CALCIUM 8.9 8.7* 8.3*  AST  --  58* 66*  ALT  --  39 48  ALKPHOS  --  134* 123  BILITOT  --  1.7* 1.1   ------------------------------------------------------------------------------------------------------------------ estimated creatinine clearance is 64.1 mL/min (by C-G formula based on Cr of 0.73). ------------------------------------------------------------------------------------------------------------------ No results for input(s): HGBA1C in the last 72 hours. ------------------------------------------------------------------------------------------------------------------  Recent Labs  04/22/15 0115  CHOL 99  HDL 22*  LDLCALC 59  TRIG 89  CHOLHDL 4.5   ------------------------------------------------------------------------------------------------------------------  Recent Labs  04/22/15 0115  TSH 2.759   ------------------------------------------------------------------------------------------------------------------ No results for input(s): VITAMINB12, FOLATE, FERRITIN, TIBC, IRON, RETICCTPCT in the last 72 hours.  Coagulation profile  Recent Labs Lab 04/21/15 2300  INR 1.41    No results for input(s): DDIMER in the last 72 hours.  Cardiac Enzymes No results for input(s): CKMB, TROPONINI, MYOGLOBIN in the last 168 hours.  Invalid input(s): CK ------------------------------------------------------------------------------------------------------------------ Invalid input(s): POCBNP   CBG:  Recent Labs Lab 04/22/15 0750  GLUCAP 129*       Studies: No results found.    No results found for: HGBA1C Lab Results  Component Value Date   LDLCALC 59 04/22/2015   CREATININE 0.73 04/23/2015       Scheduled Meds: . aspirin EC  81 mg Oral Daily  . bupivacaine liposome  20 mL Infiltration To SS-Surg  . calcium carbonate  1 tablet Oral BID WC  . carvedilol  3.125 mg Oral BID  . cefTAZidime (FORTAZ)  IV  2 g Intravenous Q8H  .  cholecalciferol  1,000 Units Oral Daily  . enoxaparin (LOVENOX) injection  40 mg Subcutaneous Q24H  . magnesium oxide  200 mg Oral BID  . methimazole  5 mg Oral Daily  . pneumococcal 23 valent vaccine  0.5 mL  Intramuscular Tomorrow-1000  . senna-docusate  1 tablet Oral BID  . simvastatin  10 mg Oral QHS   Continuous Infusions:   Principal Problem:   Septic joint of right knee joint (HCC) Active Problems:   Hypertension   Hyperlipidemia   Prostate cancer ([place])   CAD (coronary artery disease)   Septic arthritis of knee, right ([place])   Cough   Hyperthyroidism    Time spent: 45 minutes   [facility] Pager [phone]. If 7PM-7AM, please contact night-coverage at [email or web address], password [facility] 04/24/2015, 10:23 AM  LOS: 3 days

## 2015-04-24 NOTE — Consult Note (Signed)
Consultation Note Date: 04/24/2015   Patient Name: Anthony Osborn  DOB: January 05, 1920  MRN: [id number]  Age / Sex: 81 y.o., male  PCP: Cyndy Freeze, MD Referring Physician: Reyne Dumas, MD  Reason for Consultation: Establishing goals of care    Clinical Assessment/Narrative: Anthony Osborn is a 81 y.o. male with PMH of hypertension, hyperlipidemia, remote prostate cancer, hypothyroidism, CAD, s/p of CABG, who presents with right knee swelling and pain after a fall. CT of the knee joint showed loosening of the prosthesis, knee joint effusion, diffuse soft tissue edema R knee.  The patient has since been admitted to the hospitalist service, started on broad spectrum antibiotics, ID, orthopedics and cardiology have been consulted.  Patient has history of undergoing bilateral knee joint replacements in the past. He lives at home with his wife in [place].   It is noted that the patient will be undergoing surgery soon, to perform I&D as well as to have new replacement of prosthesis. A palliative consult has been placed for addressing goals of care.   The patient and his wife are present in the patient's room. The patient is resting well, he is in no distress. He is awake alert answers for himself. The patient's wife Anthony Osborn states that she would like for the patient to recover from this surgery well so that he can resume ambulation.  Code status remains full code at this time.  Palliative will continue to follow along to make sure the patient recovers well post op. Will remain available for symptom management post op on an as needed basis. Thank you for the consult.   Contacts/Participants in Discussion: patient himself who answers most all questions related to his care.  Primary Decision Maker:    Patient, then his wife.  Relationship to Patient wife Anthony Osborn.  HCPOA: yes     SUMMARY OF RECOMMENDATIONS: Full code To go for  surgery by orthopedics: I&D knee, replacement. Seen by cardiology.  Palliative will continue to follow along post op to remain available for possible need for symptom management of pain etc post op.  Thank you for the consult.    Code Status/Advance Care Planning: Full code    Code Status Orders        Start     Ordered   04/21/15 2234  Full code   Continuous     04/21/15 2234    Advance Directive Documentation        Most Recent Value   Type of Advance Directive  Healthcare Power of Attorney   Pre-existing out of facility DNR order (yellow form or pink MOST form)     "MOST" Form in Place?        Other Directives:Other  Symptom Management:    continue percocet for pain control.   Palliative Prophylaxis:   Bowel Regimen  Additional Recommendations (Limitations, Scope, Preferences):  follow post op course   Psycho-social/Spiritual:  Support System: [place] Desire for further Chaplaincy support:no Additional Recommendations: recommended self care to the patient's wife.   Prognosis: Unable to determine  Discharge Planning: pending hospitalization course.    Chief Complaint/ Primary Diagnoses: Present on Admission:  . Septic joint of right knee joint ([place]) . Prostate cancer ([place]) . Hypertension . Hyperlipidemia . CAD (coronary artery disease) . Septic arthritis of knee, right ([place]) . Cough . Hyperthyroidism  I have reviewed the medical record, interviewed the patient and family, and examined the patient. The following aspects are pertinent.  Past Medical History  Diagnosis Date  .  Hypertension   . Hyperlipidemia   . Prostate cancer ([place])     3 YEARS AGO(BEING OBSERVED)  . Hyperthyroidism   . CAD (coronary artery disease)    Social History   Social History  . Marital Status: Married    Spouse Name: N/A  . Number of Children: N/A  . Years of Education: N/A   Social History Main Topics  . Smoking status: Never Smoker   . Smokeless tobacco: None    . Alcohol Use: No  . Drug Use: None  . Sexual Activity: Not Asked   Other Topics Concern  . None   Social History Narrative   Family History  Problem Relation Age of Onset  . Stroke Father   . Heart disease Brother   . Diabetes Sister    Scheduled Meds: . aspirin EC  81 mg Oral Daily  . calcium carbonate  1 tablet Oral BID WC  . carvedilol  3.125 mg Oral BID  . cefTAZidime (FORTAZ)  IV  2 g Intravenous Q8H  . cholecalciferol  1,000 Units Oral Daily  . enoxaparin (LOVENOX) injection  40 mg Subcutaneous Q24H  . magnesium oxide  200 mg Oral BID  . methimazole  5 mg Oral Daily  . pneumococcal 23 valent vaccine  0.5 mL Intramuscular Tomorrow-1000  . senna-docusate  1 tablet Oral BID  . simvastatin  10 mg Oral QHS   Continuous Infusions:  PRN Meds:.acetaminophen, alum & mag hydroxide-simeth, ondansetron, oxyCODONE-acetaminophen, zolpidem Medications Prior to Admission:  Prior to Admission medications   Medication Sig Start Date End Date Taking? Authorizing Provider  acetaminophen (TYLENOL) 500 MG tablet Take 500 mg by mouth every 6 (six) hours as needed for moderate pain.   Yes Historical Provider, MD  aspirin 81 MG tablet Take 81 mg by mouth daily.     Yes Historical Provider, MD  CALCIUM-MAGNESIUM-ZINC PO Take 1 tablet by mouth 3 (three) times daily.    Yes Historical Provider, MD  carvedilol (COREG) 3.125 MG tablet Take 3.125 mg by mouth 2 (two) times daily. 01/23/15  Yes Historical Provider, MD  methimazole (TAPAZOLE) 10 MG tablet Take 5 mg by mouth daily.   Yes Historical Provider, MD  simvastatin (ZOCOR) 10 MG tablet Take 10 mg by mouth at bedtime.     Yes Historical Provider, MD  carvedilol (COREG) 12.5 MG tablet Take 12.5 mg by mouth 2 (two) times daily.      Historical Provider, MD  Cholecalciferol (VITAMIN D-3 PO) Take 1,000 Units by mouth daily.      Historical Provider, MD  lisinopril-hydrochlorothiazide (PRINZIDE,ZESTORETIC) 10-12.5 MG per tablet Take 1 tablet by  mouth daily.      Historical Provider, MD   No Known Allergies  Review of Systems Completed.   Physical Exam Weak elderly tall gentleman resting in bed R knee  Vital Signs: BP 135/57 mmHg  Pulse 81  Temp(Src) 98.2 F (36.8 C) (Oral)  Resp 20  Ht 6\' 1"  (1.854 m)  Wt 78.6 kg (173 lb 4.5 oz)  BMI 22.87 kg/m2  SpO2 95%  SpO2: SpO2: 95 % O2 Device:SpO2: 95 % O2 Flow Rate: .   IO: Intake/output summary:  Intake/Output Summary (Last 24 hours) at 04/24/15 1543 Last data filed at 04/24/15 1500  Gross per 24 hour  Intake    360 ml  Output   2770 ml  Net  -2410 ml    LBM: Last BM Date: 04/21/15 Baseline Weight: Weight: 91.627 kg (202 lb) Most recent weight: Weight: 78.6  kg (173 lb 4.5 oz)      Palliative Assessment/Data:  Flowsheet Rows        Most Recent Value   Intake Tab    Referral Department  Hospitalist   Unit at Time of Referral  Med/Surg Unit   Palliative Care Primary Diagnosis  Sepsis/Infectious Disease   Date Notified  04/24/15   Palliative Care Type  New Palliative care   Reason for referral  Clarify Goals of Care   Date of Admission  04/21/15   Date first seen by Palliative Care  04/24/15   # of days Palliative referral response time  0 Day(s)   # of days IP prior to Palliative referral  3   Clinical Assessment    Palliative Performance Scale Score  30%   Pain Max last 24 hours  4   Pain Min Last 24 hours  3   Dyspnea Max Last 24 Hours  1   Dyspnea Min Last 24 hours  1   Nausea Max Last 24 Hours  1   Nausea Min Last 24 Hours  1   Anxiety Max Last 24 Hours  1   Anxiety Min Last 24 Hours  1   Other Max Last 24 Hours  0   Psychosocial & Spiritual Assessment    Social Work Plan of Care  Counseling   Palliative Care Outcomes    Patient/Family meeting held?  No   Palliative Care Outcomes  Clarified goals of care   Palliative Care follow-up planned  Yes, Facility      Additional Data Reviewed:  CBC:    Component Value Date/Time   WBC 11.9*  04/23/2015 0526   HGB 13.1 04/23/2015 0526   HCT 39.4 04/23/2015 0526   PLT 198 04/23/2015 0526   MCV 93.4 04/23/2015 0526   NEUTROABS 10.0* 04/21/2015 1951   LYMPHSABS 2.0 04/21/2015 1951   MONOABS 1.6* 04/21/2015 1951   EOSABS 0.0 04/21/2015 1951   BASOSABS 0.0 04/21/2015 1951   Comprehensive Metabolic Panel:    Component Value Date/Time   NA 134* 04/23/2015 0526   K 3.7 04/23/2015 0526   CL 101 04/23/2015 0526   CO2 26 04/23/2015 0526   BUN 26* 04/23/2015 0526   CREATININE 0.73 04/23/2015 0526   GLUCOSE 113* 04/23/2015 0526   CALCIUM 8.3* 04/23/2015 0526   AST 66* 04/23/2015 0526   ALT 48 04/23/2015 0526   ALKPHOS 123 04/23/2015 0526   BILITOT 1.1 04/23/2015 0526   PROT 5.0* 04/23/2015 0526   ALBUMIN 1.9* 04/23/2015 0526     Time In: 1400 Time Out:  1500 Time Total:  60  Greater than 50%  of this time was spent counseling and coordinating care related to the above assessment and plan.  Signed by: Loistine Chance, MD [place], MD  04/24/2015, 3:43 PM  Please contact Palliative Medicine Team phone at [phone] for questions and concerns.

## 2015-04-25 LAB — GLUCOSE, CAPILLARY: Glucose-Capillary: 117 mg/dL — ABNORMAL HIGH (ref 65–99)

## 2015-04-25 MED ORDER — BUPIVACAINE LIPOSOME 1.3 % IJ SUSP
20.0000 mL | Freq: Once | INTRAMUSCULAR | Status: AC
  Administered 2015-04-28: 20 mL
  Filled 2015-04-25: qty 20

## 2015-04-25 MED ORDER — TRANEXAMIC ACID 1000 MG/10ML IV SOLN
2000.0000 mg | INTRAVENOUS | Status: AC
  Administered 2015-04-28: 2000 mg via TOPICAL
  Filled 2015-04-25 (×2): qty 20

## 2015-04-25 MED ORDER — BUPIVACAINE LIPOSOME 1.3 % IJ SUSP
20.0000 mL | Freq: Once | INTRAMUSCULAR | Status: DC
  Filled 2015-04-25: qty 20

## 2015-04-25 NOTE — Progress Notes (Signed)
SPORTS MEDICINE AND JOINT REPLACEMENT  Lara Mulch, MD   Carlynn Spry, PA-C [place], [place], [place]  [address]                             [phone]   PROGRESS NOTE  Subjective:  negative for Chest Pain  negative for Shortness of Breath  negative for Nausea/Vomiting   negative for Calf Pain  negative for Bowel Movement   Tolerating Diet: yes         Patient reports pain as 5 on 0-10 scale.    Objective: Vital signs in last 24 hours:   Patient Vitals for the past 24 hrs:  BP Temp Temp src Pulse Resp SpO2  04/25/15 1018 (!) 145/62 mmHg 98.7 F (37.1 C) Oral 73 18 97 %  04/25/15 0545 137/79 mmHg 98.5 F (36.9 C) Oral 75 18 96 %  04/24/15 2100 139/74 mmHg 98.8 F (37.1 C) Oral 82 18 95 %  04/24/15 1844 (!) 140/55 mmHg 99.1 F (37.3 C) Oral 79 20 96 %    @flow {1959:LAST@   Intake/Output from previous day:   11/17 0701 - 11/18 0700 In: 920 [P.O.:720] Out: 3470 [Urine:3470]   Intake/Output this shift:   11/18 0701 - 11/18 1900 In: 240 [P.O.:240] Out: 300 [Urine:300]   Intake/Output      11/17 0701 - 11/18 0700 11/18 0701 - 11/19 0700   P.O. 720 240   IV Piggyback 200    Total Intake(mL/kg) 920 (11.7) 240 (3.1)   Urine (mL/kg/hr) 3470 (1.8) 300 (0.7)   Stool 0 (0) 0 (0)   Total Output 3470 300   Net -2550 -60           LABORATORY DATA:  Recent Labs  04/21/15 1951 04/22/15 0115 04/23/15 0526  WBC 13.6* 12.3* 11.9*  HGB 13.8 13.9 13.1  HCT 42.0 41.5 39.4  PLT 192 204 198    Recent Labs  04/21/15 1951 04/22/15 0115 04/23/15 0526  NA 132* 131* 134*  K 4.5 4.1 3.7  CL 97* 94* 101  CO2 25 27 26   BUN 27* 29* 26*  CREATININE 0.85 0.91 0.73  GLUCOSE 109* 128* 113*  CALCIUM 8.9 8.7* 8.3*   Lab Results  Component Value Date   INR 1.41 04/21/2015    Examination:  General appearance: alert, cooperative and no distress Extremities: Homans sign is negative, no sign of DVT  Wound Exam: clean, dry, intact   Drainage:  None:  wound tissue dry  Motor Exam: Quadriceps and Hamstrings Intact  Sensory Exam: Deep Peroneal normal   Assessment:       Procedure(s) (LRB): INCISION AND DRAINAGE (Right) TOTAL KNEE REVISION (Right)  ADDITIONAL DIAGNOSIS:  Principal Problem:   Septic joint of right knee joint (HCC) Active Problems:   Hypertension   Hyperlipidemia   Prostate cancer (HCC)   CAD (coronary artery disease)   Septic arthritis of knee, right (HCC)   Cough   Hyperthyroidism   Pyogenic arthritis of right knee joint (HCC)   Essential hypertension   Encounter for palliative care  Acute Blood Loss Anemia   Plan: Physical Therapy as ordered Weight Bearing as Tolerated (WBAT)  Right total knee revision and I&D on Monday 04/28/15, 11:15       Anthony Osborn 04/25/2015, 12:21 PM

## 2015-04-25 NOTE — Progress Notes (Signed)
[facility] for Infectious Disease    Date of Admission:  04/21/2015   Total days of antibiotics 5        Day 3 ceftaz           ID: Denilson Anderegg is a 81 y.o. 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 Patient planned for IX D and knee replacement on 11/21  Medications:  . aspirin EC  81 mg Oral Daily  . calcium carbonate  1 tablet Oral BID WC  . carvedilol  3.125 mg Oral BID  . cefTAZidime (FORTAZ)  IV  2 g Intravenous Q8H  . chlorhexidine  60 mL Topical Once  . cholecalciferol  1,000 Units Oral Daily  . enoxaparin (LOVENOX) injection  40 mg Subcutaneous Q24H  . magnesium oxide  200 mg Oral BID  . methimazole  5 mg Oral Daily  . senna-docusate  1 tablet Oral BID  . simvastatin  10 mg Oral QHS  . tranexamic acid (CYKLOKAPRON) topical -INTRAOP  2,000 mg Topical To SS-Surg    Objective: Vital signs in last 24 hours: Temp:  [98.5 F (36.9 C)-99.1 F (37.3 C)] 98.7 F (37.1 C) (11/18 1018) Pulse Rate:  [73-82] 73 (11/18 1018) Resp:  [18-20] 18 (11/18 1018) BP: (137-145)/(55-79) 145/62 mmHg (11/18 1018) SpO2:  [95 %-97 %] 97 % (11/18 1018) Physical Exam  Constitutional: He is oriented to person, place, and time. He appears well-developed and well-nourished. No distress.  HENT:  Mouth/Throat: Oropharynx is clear and moist. No oropharyngeal exudate.  Cardiovascular: Normal rate, regular rhythm and normal heart sounds. Exam reveals no gallop and no friction rub.  No murmur heard.  Pulmonary/Chest: Effort normal and breath sounds normal. No respiratory distress. He has no wheezes.  Abdominal:  Soft. Bowel sounds are normal. He exhibits no distension. There is no tenderness.  Lymphadenopathy:  He has no cervical adenopathy.  Neurological: He is alert and oriented to person, place, and time.  Skin: Skin is warm and dry. No rash noted. No erythema.  Ext: right knee is warm, tender to touch Psychiatric: He has a normal mood and affect. His behavior is normal.     Lab Results  Recent Labs  04/23/15 0526  WBC 11.9*  HGB 13.1  HCT 39.4  NA 134*  K 3.7  CL 101  CO2 26  BUN 26*  CREATININE 0.73   Liver Panel  Recent Labs  04/23/15 0526  PROT 5.0*  ALBUMIN 1.9*  AST 66*  ALT 48  ALKPHOS 123  BILITOT 1.1   Sedimentation Rate  Recent Labs  04/23/15 0526  ESRSEDRATE 67*   C-Reactive Protein  Recent Labs  04/23/15 0526  CRP 27.2*    Microbiology: 11/14 arthrocentesis PsA (pan sensitive) 11/14 blood cx pending Studies/Results: No results found.   Assessment/Plan: Pseudomonal prosthetic joint infection in patient who is mod to high risk surgical candidate  - plan to continue with fortaz x 6 wk. Using 11/22 as day 1 of 42, after he has 1-staged revision where old components are removed, washout, and new implant done in one procedure.  Will see back on Monday. Dr. Lucianne Lei dam available for questions  [facility] Flattery [facility] for Infectious Diseases Cell: [phone] Pager: [phone]  04/25/2015, 1:14 PM

## 2015-04-25 NOTE — Progress Notes (Signed)
     SUBJECTIVE: No chest pain or SOB.   BP 137/79 mmHg  Pulse 75  Temp(Src) 98.5 F (36.9 C) (Oral)  Resp 18  Ht 6\' 1"  (1.854 m)  Wt 173 lb 4.5 oz (78.6 kg)  BMI 22.87 kg/m2  SpO2 96%  Intake/Output Summary (Last 24 hours) at 04/25/15 0747 Last data filed at 04/25/15 0545  Gross per 24 hour  Intake    920 ml  Output   3470 ml  Net  -2550 ml    PHYSICAL EXAM General: Well developed, well nourished, in no acute distress. Alert and oriented x 3.  Psych:  Good affect, responds appropriately Neck: No JVD. No masses noted.  Lungs: Clear bilaterally with no wheezes or rhonci noted.  Heart: RRR with no murmurs noted. Abdomen: Bowel sounds are present. Soft, non-tender.  Extremities: No lower extremity edema.   LABS: Basic Metabolic Panel:  Recent Labs  04/23/15 0526  NA 134*  K 3.7  CL 101  CO2 26  GLUCOSE 113*  BUN 26*  CREATININE 0.73  CALCIUM 8.3*   CBC:  Recent Labs  04/23/15 0526  WBC 11.9*  HGB 13.1  HCT 39.4  MCV 93.4  PLT 198   Current Meds: . aspirin EC  81 mg Oral Daily  . calcium carbonate  1 tablet Oral BID WC  . carvedilol  3.125 mg Oral BID  .  ceFAZolin (ANCEF) IV  2 g Intravenous To SS-Surg  . cefTAZidime (FORTAZ)  IV  2 g Intravenous Q8H  . chlorhexidine  60 mL Topical Once  . cholecalciferol  1,000 Units Oral Daily  . enoxaparin (LOVENOX) injection  40 mg Subcutaneous Q24H  . magnesium oxide  200 mg Oral BID  . methimazole  5 mg Oral Daily  . senna-docusate  1 tablet Oral BID  . simvastatin  10 mg Oral QHS  . tranexamic acid (CYKLOKAPRON) topical -INTRAOP  2,000 mg Topical To SS-Surg     ASSESSMENT AND PLAN: 81 yo male admitted with septic right knee, known CAD s/p CABG, LV systolic dysfunction noted here on pre-op echo. His CABG [facility] in [facility] and his f/u has been with Dr. Geraldo Pitter.   1. Preoperative cardiac risk assessment: We are asked to consult to outline his cardiac risk with potential surgical correction of his septic  prosthetic right knee. He is followed in the cardiology office in [facility]. Last echo in 2012 showed LVEF=35% so his LV dysfunction is chronic. LVEF by echo here is 25-30%. I have had a long discussion with the patient and his family regarding cardiac risk. Given his advanced age and underlying CAD with prior CABG, he is at moderate risk with his orthopedic procedure. He has no angina and is very active at baseline. While his risk for any operative procedure with general anesthesia is moderate, I would proceed with the planned procedure if it is felt to be the best treatment option for his septic joint.   2. Septic R knee: Planning for treatment per primary team, ID and ortho.   3. Chronic LV systolic dysfunction: He is euvolemic.   4. CAD: s/p 3v CABG on 03/17/2011 at [facility]: no angina.   Cardiology will sign off. Please call with questions.      Kennette Cuthrell  11/18/20167:47 AM

## 2015-04-25 NOTE — Progress Notes (Signed)
OT Cancellation Note  Patient Details Name: Rayshard Abbatiello MRN: [id number] DOB: 1919-12-01   Cancelled Treatment:    Reason Eval/Treat Not Completed: Patient not medically ready (pending surg today with Dr Ronnie Derby) OT spoke with RN Lurena Joiner regarding pending surg and OT to hold treatment until after surgery.   Vonita Moss   OTR/L Pager: [phone] Office: [phone] .  04/25/2015, 11:17 AM

## 2015-04-25 NOTE — Progress Notes (Signed)
[facility] PROGRESS NOTE  Anthony Osborn [id number] DOB: 1920-06-05 DOA: 04/21/2015 PCP: Cyndy Freeze, MD  Length of stay: 4   Assessment/Plan: Principal Problem:   Septic joint of right knee joint ([place]) Active Problems:   Hypertension   Hyperlipidemia   Prostate cancer ([place])   CAD (coronary artery disease)   Septic arthritis of knee, right (HCC)   Cough   Hyperthyroidism   Pyogenic arthritis of right knee joint ([place])   Essential hypertension   Encounter for palliative care    Septic joint of right knee joint [facility]): Synovial fluid showed WBC 69330 without Crystal, plus leukocytosis and mild fever, consistent with septic joint. Patient does not meet criteria for sepsis, hemodynamically stable. plan to continue with fortaz x 6 wk. We will plan to do medical management regardless if he has surgery or not. Per ID , Pseudomonal infection is difficult to be succesfully treated without surgery.   Dr. Ronnie Derby is planning on doing a 1-staged revision where old components are removed, washout, and new implant done in one procedure on Monday at 11:15 AM Pseudomonas is sensitive to Cipro, cefepime, Fortaz Blood culture no growth so far Normal pro calcitonin and lactic acid Per cardiology his risk for any operative procedure with general anesthesia is moderate, cardiology recommends to   proceed with the planned procedure if it is felt to be the best treatment option for his septic joint.  Dr. Ronnie Derby updated about cardiology/infectious disease recommendations   Chronic systolic heart failure without exacerbation 2-D echo shows LV EF: 25% -  30%o , last echo in 2012 showed EF of 35% Patient refuses lisinopril  , however recommended that he may need this prior to discharge EKG shows PVCs with LVH, left anterior fascicular block    Cough: Unclear etiology Chest x-ray pneumonia versus fluid, patient already receiving antibiotics as above Result  Hypertension: Hold lisinopril  10 mg daily,-continue Coreg  HLD:   LDL  59 -Continue home medications: Zocor    CAD (coronary artery disease): s/p of CABG. No CP. -on ASA, coreg and zocor  Hyperthyroidism: No TSH are recorded. -Continue methimazole TSH 2.759  History of prostrate cancer UA negative   DVT prophylaxsis  Lovenox  Code Status:      Code Status Orders        Start     Ordered   04/21/15 2234  Full code   Continuous     04/21/15 2234    Advance Directive Documentation        Most Recent Value   Type of Advance Directive  Healthcare Power of Attorney   Pre-existing out of facility DNR order (yellow form or pink MOST form)     "MOST" Form in Place?       Family Communication: family updated about patient's clinical progress Disposition Plan:  According to Dr. Ronnie Derby, surgery on Monday at 11:15   Brief narrative: Anthony Osborn is a 81 y.o. male with PMH of hypertension, hyperlipidemia, remote prostate cancer, hypothyroidism, CAD, s/p of CABG, who presents with right knee swelling and pain.  The patient reports that he had a minor injury to his R knee at one year. In the past 4 days, he has been having swelling and pain over right knee joint. He also has mild fever, chills and sweating. He was seen by orthopedic surgeon, Dr. Leona Carry, who did aspiration and removed 75 mL of synovial fluid. Initial analysis showed no crystals, but with WBC 69330. Patient reports  that he has mild cough with the yellow colored sputum production, no chest pain or shortness of breath. No abdominal pain, diarrhea, symptoms of UTI, unilateral weakness.  In ED, patient was found to have WBC 13.6, temperature 99.9, heart rate 95, electrolytes okay, renal functioning okay, lactate 1.7. Patient admitted to inpatient for further evaluation and treatment. Orthopedic surgeon, Dr. Percell Miller was consulted by ED.  CT of the right knee showed significant lucency about the tibial plateau, underlying the prostheses. This may  reflect some degree of chronic loosening about the plate. Lucency underlying the femoral component of the prosthesis, and minimally underlying the patellofemoral component, also concerning for loosening. No definite acute fracture seen. moderate complex knee joint effusion, with mild loculation. This is markedly thick walled. The appearance suggests chronic synovial inflammation, though underlying infection cannot be excluded. Diffuse soft tissue edema tracking along the right knee and lower leg.Diffuse vascular calcifications seen. Varices noted along the right lower leg.    Consultants:  Orthopedics  Procedures:  None  Antibiotics: Anti-infectives    Start     Dose/Rate Route Frequency Ordered Stop   04/25/15 0600  ceFAZolin (ANCEF) IVPB 2 g/50 mL premix  Status:  Discontinued     2 g 100 mL/hr over 30 Minutes Intravenous To ShortStay Surgical 04/24/15 2003 04/25/15 0931   04/23/15 1200  cefTAZidime (FORTAZ) 2 g in dextrose 5 % 50 mL IVPB     2 g 100 mL/hr over 30 Minutes Intravenous Every 8 hours 04/23/15 1122     04/22/15 2200  vancomycin (VANCOCIN) 1,250 mg in sodium chloride 0.9 % 250 mL IVPB  Status:  Discontinued     1,250 mg 166.7 mL/hr over 90 Minutes Intravenous Every 24 hours 04/21/15 2246 04/22/15 1419   04/21/15 2245  vancomycin (VANCOCIN) 1,750 mg in sodium chloride 0.9 % 500 mL IVPB     1,750 mg 250 mL/hr over 120 Minutes Intravenous  Once 04/21/15 2237 04/22/15 0309   04/21/15 2245  cefTRIAXone (ROCEPHIN) 2 g in dextrose 5 % 50 mL IVPB  Status:  Discontinued     2 g 100 mL/hr over 30 Minutes Intravenous Every 24 hours 04/21/15 2238 04/23/15 1122   04/21/15 2045  ceFAZolin (ANCEF) IVPB 1 g/50 mL premix     1 g 100 mL/hr over 30 Minutes Intravenous  Once 04/21/15 2038 04/21/15 2132         HPI/Subjective: Patient and family waiting to hear from Dr. Lorre Nick regarding the timing of the surgery   Objective: Filed Vitals:   04/24/15 1844 04/24/15 2100 04/25/15 0545  04/25/15 1018  BP: 140/55 139/74 137/79 145/62  Pulse: 79 82 75 73  Temp: 99.1 F (37.3 C) 98.8 F (37.1 C) 98.5 F (36.9 C) 98.7 F (37.1 C)  TempSrc: Oral Oral Oral Oral  Resp: 20 18 18 18   Height:      Weight:      SpO2: 96% 95% 96% 97%    Intake/Output Summary (Last 24 hours) at 04/25/15 1120 Last data filed at 04/25/15 1019  Gross per 24 hour  Intake    650 ml  Output   3550 ml  Net  -2900 ml    Exam:  General: Well developed, well nourished, in no acute distress. Alert and oriented x 3.  Psych: Good affect, responds appropriately Neck: No JVD. No masses noted.  Lungs: Clear bilaterally with no wheezes or rhonci noted.  Heart: RRR with no murmurs noted. Abdomen: Bowel sounds are present. Soft, non-tender. Musculoskeletal:  Right knee is tender, warm and swollen. No right knee deformity. Normal distal sensation     Data Review   Micro Results Recent Results (from the past 240 hour(s))  Culture, body fluid-bottle     Status: None   Collection Time: 04/21/15  8:02 PM  Result Value Ref Range Status   Specimen Description FLUID RIGHT KNEE  Final   Special Requests NONE  Final   Gram Stain   Final    GRAM NEGATIVE RODS CRITICAL RESULT CALLED TO, READ BACK BY AND VERIFIED WITH: IAnnamaria Helling RN 10:45 04/22/15 ([email or web address])    Culture PSEUDOMONAS AERUGINOSA  Final   Report Status 04/24/2015 FINAL  Final   Organism ID, Bacteria PSEUDOMONAS AERUGINOSA  Final      Susceptibility   Pseudomonas aeruginosa - MIC*    CEFTAZIDIME <=1 SENSITIVE Sensitive     CIPROFLOXACIN <=0.25 SENSITIVE Sensitive     GENTAMICIN <=1 SENSITIVE Sensitive     IMIPENEM 1 SENSITIVE Sensitive     PIP/TAZO <=4 SENSITIVE Sensitive     CEFEPIME <=1 SENSITIVE Sensitive     * PSEUDOMONAS AERUGINOSA  Gram stain     Status: None   Collection Time: 04/21/15  8:02 PM  Result Value Ref Range Status   Specimen Description FLUID RIGHT KNEE  Final   Special Requests NONE  Final   Gram Stain   Final     ABUNDANT WBC PRESENT, PREDOMINANTLY PMN NO ORGANISMS SEEN    Report Status 04/21/2015 FINAL  Final  Culture, blood (routine x 2)     Status: None (Preliminary result)   Collection Time: 04/21/15 10:50 PM  Result Value Ref Range Status   Specimen Description BLOOD LEFT ARM  Final   Special Requests BOTTLES DRAWN AEROBIC AND ANAEROBIC 5CC  Final   Culture NO GROWTH 4 DAYS  Final   Report Status PENDING  Incomplete  Culture, blood (routine x 2)     Status: None (Preliminary result)   Collection Time: 04/21/15 11:00 PM  Result Value Ref Range Status   Specimen Description BLOOD LEFT FOREARM  Final   Special Requests BOTTLES DRAWN AEROBIC AND ANAEROBIC 10CC  Final   Culture NO GROWTH 4 DAYS  Final   Report Status PENDING  Incomplete    Radiology Reports Ct Knee Right Wo Contrast  04/21/2015  CLINICAL DATA:  Twisting injury to right knee while working in garden. Unable to bear weight, with swelling extending to the foot. Initial encounter. EXAM: CT OF THE RIGHT KNEE WITHOUT CONTRAST TECHNIQUE: Multidetector CT imaging of the right knee was performed according to the standard protocol. Multiplanar CT image reconstructions were also generated. COMPARISON:  None. FINDINGS: There is significant lucency noted about the tibial plateau, underlying the prosthesis. This may reflect some degree of chronic loosening about the plate. There is also lucency underlying the femoral component of the prosthesis, concerning for loosening, and minimally underlying the patellofemoral compartment. No definite acute fracture is seen. Note is made of a moderate complex knee joint effusion, with mild loculation. This is markedly thick walled. The appearance suggests chronic synovial inflammation, though underlying infection cannot be excluded. A tiny focus of air within the collection reflects recent aspiration of fluid. Diffuse soft tissue edema is noted tracking along the right knee and lower leg. Varices are seen along  the right lower leg. Diffuse vascular calcifications are noted. IMPRESSION: 1. Significant lucency about the tibial plateau, underlying the prostheses. This may reflect some degree of chronic loosening about the plate. Lucency  underlying the femoral component of the prosthesis, and minimally underlying the patellofemoral component, also concerning for loosening. No definite acute fracture seen. 2. Moderate complex knee joint effusion, with mild loculation. This is markedly thick walled. The appearance suggests chronic synovial inflammation, though underlying infection cannot be excluded. Would correlate with lab findings from recently aspirated fluid. 3. Diffuse soft tissue edema tracking along the right knee and lower leg. 4. Diffuse vascular calcifications seen. 5. Varices noted along the right lower leg. Electronically Signed   By: Garald Balding M.D.   On: 04/21/2015 21:16   Portable Chest 1 View  04/21/2015  CLINICAL DATA:  Right knee pain and swelling, worsening over the last 4 days. Cough. Elevated white count. EXAM: PORTABLE CHEST 1 VIEW COMPARISON:  03/25/2009 FINDINGS: There has been previous median sternotomy and CABG. The heart is enlarged. There is venous hypertension with interstitial edema worrisome for congestive heart failure. Patchy density at the lung bases could also represent pneumonia. No effusions. Bony structures are unremarkable. IMPRESSION: Interval CABG. Cardiomegaly. Pulmonary venous hypertension with interstitial edema. Patchy density at the bases could relate to edema and atelectasis or could represent mild basilar pneumonia. Electronically Signed   By: Nelson Chimes M.D.   On: 04/21/2015 23:18     CBC  Recent Labs Lab 04/21/15 1951 04/22/15 0115 04/23/15 0526  WBC 13.6* 12.3* 11.9*  HGB 13.8 13.9 13.1  HCT 42.0 41.5 39.4  PLT 192 204 198  MCV 92.7 93.0 93.4  MCH 30.5 31.2 31.0  MCHC 32.9 33.5 33.2  RDW 13.9 13.9 14.0  LYMPHSABS 2.0  --   --   MONOABS 1.6*  --   --    EOSABS 0.0  --   --   BASOSABS 0.0  --   --     Chemistries   Recent Labs Lab 04/21/15 1951 04/22/15 0115 04/23/15 0526  NA 132* 131* 134*  K 4.5 4.1 3.7  CL 97* 94* 101  CO2 25 27 26   GLUCOSE 109* 128* 113*  BUN 27* 29* 26*  CREATININE 0.85 0.91 0.73  CALCIUM 8.9 8.7* 8.3*  AST  --  58* 66*  ALT  --  39 48  ALKPHOS  --  134* 123  BILITOT  --  1.7* 1.1   ------------------------------------------------------------------------------------------------------------------ estimated creatinine clearance is 64.1 mL/min (by C-G formula based on Cr of 0.73). ------------------------------------------------------------------------------------------------------------------ No results for input(s): HGBA1C in the last 72 hours. ------------------------------------------------------------------------------------------------------------------ No results for input(s): CHOL, HDL, LDLCALC, TRIG, CHOLHDL, LDLDIRECT in the last 72 hours. ------------------------------------------------------------------------------------------------------------------ No results for input(s): TSH, T4TOTAL, T3FREE, THYROIDAB in the last 72 hours.  Invalid input(s): FREET3 ------------------------------------------------------------------------------------------------------------------ No results for input(s): VITAMINB12, FOLATE, FERRITIN, TIBC, IRON, RETICCTPCT in the last 72 hours.  Coagulation profile  Recent Labs Lab 04/21/15 2300  INR 1.41    No results for input(s): DDIMER in the last 72 hours.  Cardiac Enzymes No results for input(s): CKMB, TROPONINI, MYOGLOBIN in the last 168 hours.  Invalid input(s): CK ------------------------------------------------------------------------------------------------------------------ Invalid input(s): POCBNP   CBG:  Recent Labs Lab 04/22/15 0750 04/24/15 1320 04/25/15 0616  GLUCAP 129* 136* 117*       Studies: No results found.    No  results found for: HGBA1C Lab Results  Component Value Date   LDLCALC 59 04/22/2015   CREATININE 0.73 04/23/2015       Scheduled Meds: . aspirin EC  81 mg Oral Daily  . calcium carbonate  1 tablet Oral BID WC  . carvedilol  3.125 mg  Oral BID  . cefTAZidime (FORTAZ)  IV  2 g Intravenous Q8H  . chlorhexidine  60 mL Topical Once  . cholecalciferol  1,000 Units Oral Daily  . enoxaparin (LOVENOX) injection  40 mg Subcutaneous Q24H  . magnesium oxide  200 mg Oral BID  . methimazole  5 mg Oral Daily  . senna-docusate  1 tablet Oral BID  . simvastatin  10 mg Oral QHS  . tranexamic acid (CYKLOKAPRON) topical -INTRAOP  2,000 mg Topical To SS-Surg   Continuous Infusions: . sodium chloride      Principal Problem:   Septic joint of right knee joint (HCC) Active Problems:   Hypertension   Hyperlipidemia   Prostate cancer (HCC)   CAD (coronary artery disease)   Septic arthritis of knee, right (HCC)   Cough   Hyperthyroidism   Pyogenic arthritis of right knee joint ([place])   Essential hypertension   Encounter for palliative care    Time spent: 45 minutes   [facility] Pager [phone]. If 7PM-7AM, please contact night-coverage at [email or web address], password [facility] 04/25/2015, 11:20 AM  LOS: 4 days

## 2015-04-26 LAB — CULTURE, BLOOD (ROUTINE X 2)
Culture: NO GROWTH
Culture: NO GROWTH

## 2015-04-26 LAB — COMPREHENSIVE METABOLIC PANEL
ALBUMIN: 1.6 g/dL — AB (ref 3.5–5.0)
ALK PHOS: 211 U/L — AB (ref 38–126)
ALT: 71 U/L — AB (ref 17–63)
ANION GAP: 6 (ref 5–15)
AST: 74 U/L — AB (ref 15–41)
BUN: 19 mg/dL (ref 6–20)
CALCIUM: 8.3 mg/dL — AB (ref 8.9–10.3)
CO2: 28 mmol/L (ref 22–32)
CREATININE: 0.69 mg/dL (ref 0.61–1.24)
Chloride: 101 mmol/L (ref 101–111)
GFR calc Af Amer: >60 mL/min (ref >60)
GFR calc non Af Amer: >60 mL/min (ref >60)
GLUCOSE: 103 mg/dL — AB (ref 65–99)
Potassium: 3.7 mmol/L (ref 3.5–5.1)
SODIUM: 135 mmol/L (ref 135–145)
Total Bilirubin: 1.1 mg/dL (ref 0.3–1.2)
Total Protein: 5.2 g/dL — ABNORMAL LOW (ref 6.5–8.1)

## 2015-04-26 LAB — GLUCOSE, CAPILLARY: Glucose-Capillary: 100 mg/dL — ABNORMAL HIGH (ref 65–99)

## 2015-04-26 NOTE — Progress Notes (Addendum)
[facility] PROGRESS NOTE  Anthony Osborn [id number] DOB: 27-Jun-1919 DOA: 04/21/2015 PCP: Cyndy Freeze, MD  Length of stay: 5   Assessment/Plan: Principal Problem:   Septic joint of right knee joint ([place]) Active Problems:   Hypertension   Hyperlipidemia   Prostate cancer ([place])   CAD (coronary artery disease)   Septic arthritis of knee, right (HCC)   Cough   Hyperthyroidism   Pyogenic arthritis of right knee joint ([place])   Essential hypertension   Encounter for palliative care    Septic joint of right knee joint [facility]): Synovial fluid showed WBC 69330 without Crystal, plus leukocytosis and mild fever, consistent with septic joint. Patient does not meet criteria for sepsis, hemodynamically stable. plan to continue with fortaz x 6 wk. We will plan to do medical management regardless if he has surgery or not. Per ID , Pseudomonal infection is difficult to be succesfully treated without surgery.   Dr. Ronnie Derby is planning on doing a 1-staged revision where old components are removed, washout, and new implant done in one procedure on Monday at 11:15 AM plan to continue with fortaz x 6 wk. Using 11/22 as day 1 of 42, after he has 1-staged revision  Blood culture no growth so far Normal pro calcitonin and lactic acid Per cardiology his risk for any operative procedure with general anesthesia is moderate, cardiology recommends to   proceed with the planned procedure if it is felt to be the best treatment option for his septic joint.  Dr. Ronnie Derby updated about cardiology/infectious disease recommendations   Chronic systolic heart failure without exacerbation 2-D echo shows LV EF: 25% -  30%o , last echo in 2012 showed EF of 35%  Place patient on lisinopril prior to discharge EKG shows PVCs with LVH, left anterior fascicular block    Cough: Unclear etiology Chest x-ray pneumonia versus fluid, patient already receiving antibiotics as above   Hypertension: Hold lisinopril 10 mg  daily,-continue Coreg  HLD:   LDL  59 -Continue home medications: Zocor    CAD (coronary artery disease): s/p of CABG. No CP. -on ASA, coreg and zocor  Hyperthyroidism: No TSH are recorded. -Continue methimazole TSH 2.759  History of prostrate cancer UA negative   DVT prophylaxsis  Lovenox  Code Status:      Code Status Orders        Start     Ordered   04/21/15 2234  Full code   Continuous     04/21/15 2234    Advance Directive Documentation        Most Recent Value   Type of Advance Directive  Healthcare Power of Attorney   Pre-existing out of facility DNR order (yellow form or pink MOST form)     "MOST" Form in Place?       Family Communication: family updated about patient's clinical progress Disposition Plan:  According to Dr. Ronnie Derby, surgery on Monday at 11:15   Brief narrative: Anthony Osborn is a 81 y.o. male with PMH of hypertension, hyperlipidemia, remote prostate cancer, hypothyroidism, CAD, s/p of CABG, who presents with right knee swelling and pain.  The patient reports that he had a minor injury to his R knee at one year. In the past 4 days, he has been having swelling and pain over right knee joint. He also has mild fever, chills and sweating. He was seen by orthopedic surgeon, Dr. Leona Carry, who did aspiration and removed 75 mL of synovial fluid. Initial analysis showed no  crystals, but with WBC 69330. Patient reports that he has mild cough with the yellow colored sputum production, no chest pain or shortness of breath. No abdominal pain, diarrhea, symptoms of UTI, unilateral weakness.  In ED, patient was found to have WBC 13.6, temperature 99.9, heart rate 95, electrolytes okay, renal functioning okay, lactate 1.7. Patient admitted to inpatient for further evaluation and treatment. Orthopedic surgeon, Dr. Percell Miller was consulted by ED.  CT of the right knee showed significant lucency about the tibial plateau, underlying the prostheses. This may reflect some  degree of chronic loosening about the plate. Lucency underlying the femoral component of the prosthesis, and minimally underlying the patellofemoral component, also concerning for loosening. No definite acute fracture seen. moderate complex knee joint effusion, with mild loculation. This is markedly thick walled. The appearance suggests chronic synovial inflammation, though underlying infection cannot be excluded. Diffuse soft tissue edema tracking along the right knee and lower leg.Diffuse vascular calcifications seen. Varices noted along the right lower leg.    Consultants:  Orthopedics  Procedures:  None  Antibiotics: Anti-infectives    Start     Dose/Rate Route Frequency Ordered Stop   04/25/15 0600  ceFAZolin (ANCEF) IVPB 2 g/50 mL premix  Status:  Discontinued     2 g 100 mL/hr over 30 Minutes Intravenous To [facility] 04/24/15 2003 04/25/15 0931   04/23/15 1200  cefTAZidime (FORTAZ) 2 g in dextrose 5 % 50 mL IVPB     2 g 100 mL/hr over 30 Minutes Intravenous Every 8 hours 04/23/15 1122     04/22/15 2200  vancomycin (VANCOCIN) 1,250 mg in sodium chloride 0.9 % 250 mL IVPB  Status:  Discontinued     1,250 mg 166.7 mL/hr over 90 Minutes Intravenous Every 24 hours 04/21/15 2246 04/22/15 1419   04/21/15 2245  vancomycin (VANCOCIN) 1,750 mg in sodium chloride 0.9 % 500 mL IVPB     1,750 mg 250 mL/hr over 120 Minutes Intravenous  Once 04/21/15 2237 04/22/15 0309   04/21/15 2245  cefTRIAXone (ROCEPHIN) 2 g in dextrose 5 % 50 mL IVPB  Status:  Discontinued     2 g 100 mL/hr over 30 Minutes Intravenous Every 24 hours 04/21/15 2238 04/23/15 1122   04/21/15 2045  ceFAZolin (ANCEF) IVPB 1 g/50 mL premix     1 g 100 mL/hr over 30 Minutes Intravenous  Once 04/21/15 2038 04/21/15 2132         HPI/Subjective: Patient planned for IX D and knee replacement on 11/21 , no family in the room,  patient is pleasant and appropriate   Objective: Filed Vitals:   04/25/15 2127 04/25/15  2143 04/26/15 0521 04/26/15 0825  BP: 146/72 158/70 148/78 169/91  Pulse: 80 85 82 94  Temp:  98.9 F (37.2 C) 98.3 F (36.8 C) 98.2 F (36.8 C)  TempSrc:  Oral Oral Oral  Resp:  20 20 19   Height:      Weight:      SpO2:  94% 94% 97%    Intake/Output Summary (Last 24 hours) at 04/26/15 1207 Last data filed at 04/26/15 0920  Gross per 24 hour  Intake   1040 ml  Output   1600 ml  Net   -560 ml    Exam:  General: Well developed, well nourished, in no acute distress. Alert and oriented x 3.  Psych: Good affect, responds appropriately Neck: No JVD. No masses noted.  Lungs: Clear bilaterally with no wheezes or rhonci noted.  Heart: RRR with  no murmurs noted. Abdomen: Bowel sounds are present. Soft, non-tender. Musculoskeletal: Right knee is tender, warm and swollen. No right knee deformity. Normal distal sensation     Data Review   Micro Results Recent Results (from the past 240 hour(s))  Culture, body fluid-bottle     Status: None   Collection Time: 04/21/15  8:02 PM  Result Value Ref Range Status   Specimen Description FLUID RIGHT KNEE  Final   Special Requests NONE  Final   Gram Stain   Final    GRAM NEGATIVE RODS CRITICAL RESULT CALLED TO, READ BACK BY AND VERIFIED WITH: IAnnamaria Helling RN 10:45 04/22/15 ([email or web address])    Culture PSEUDOMONAS AERUGINOSA  Final   Report Status 04/24/2015 FINAL  Final   Organism ID, Bacteria PSEUDOMONAS AERUGINOSA  Final      Susceptibility   Pseudomonas aeruginosa - MIC*    CEFTAZIDIME <=1 SENSITIVE Sensitive     CIPROFLOXACIN <=0.25 SENSITIVE Sensitive     GENTAMICIN <=1 SENSITIVE Sensitive     IMIPENEM 1 SENSITIVE Sensitive     PIP/TAZO <=4 SENSITIVE Sensitive     CEFEPIME <=1 SENSITIVE Sensitive     * PSEUDOMONAS AERUGINOSA  Gram stain     Status: None   Collection Time: 04/21/15  8:02 PM  Result Value Ref Range Status   Specimen Description FLUID RIGHT KNEE  Final   Special Requests NONE  Final   Gram Stain   Final    ABUNDANT  WBC PRESENT, PREDOMINANTLY PMN NO ORGANISMS SEEN    Report Status 04/21/2015 FINAL  Final  Culture, blood (routine x 2)     Status: None (Preliminary result)   Collection Time: 04/21/15 10:50 PM  Result Value Ref Range Status   Specimen Description BLOOD LEFT ARM  Final   Special Requests BOTTLES DRAWN AEROBIC AND ANAEROBIC 5CC  Final   Culture NO GROWTH 4 DAYS  Final   Report Status PENDING  Incomplete  Culture, blood (routine x 2)     Status: None (Preliminary result)   Collection Time: 04/21/15 11:00 PM  Result Value Ref Range Status   Specimen Description BLOOD LEFT FOREARM  Final   Special Requests BOTTLES DRAWN AEROBIC AND ANAEROBIC 10CC  Final   Culture NO GROWTH 4 DAYS  Final   Report Status PENDING  Incomplete    Radiology Reports Ct Knee Right Wo Contrast  04/21/2015  CLINICAL DATA:  Twisting injury to right knee while working in garden. Unable to bear weight, with swelling extending to the foot. Initial encounter. EXAM: CT OF THE RIGHT KNEE WITHOUT CONTRAST TECHNIQUE: Multidetector CT imaging of the right knee was performed according to the standard protocol. Multiplanar CT image reconstructions were also generated. COMPARISON:  None. FINDINGS: There is significant lucency noted about the tibial plateau, underlying the prosthesis. This may reflect some degree of chronic loosening about the plate. There is also lucency underlying the femoral component of the prosthesis, concerning for loosening, and minimally underlying the patellofemoral compartment. No definite acute fracture is seen. Note is made of a moderate complex knee joint effusion, with mild loculation. This is markedly thick walled. The appearance suggests chronic synovial inflammation, though underlying infection cannot be excluded. A tiny focus of air within the collection reflects recent aspiration of fluid. Diffuse soft tissue edema is noted tracking along the right knee and lower leg. Varices are seen along the right  lower leg. Diffuse vascular calcifications are noted. IMPRESSION: 1. Significant lucency about the tibial plateau, underlying the prostheses. This  may reflect some degree of chronic loosening about the plate. Lucency underlying the femoral component of the prosthesis, and minimally underlying the patellofemoral component, also concerning for loosening. No definite acute fracture seen. 2. Moderate complex knee joint effusion, with mild loculation. This is markedly thick walled. The appearance suggests chronic synovial inflammation, though underlying infection cannot be excluded. Would correlate with lab findings from recently aspirated fluid. 3. Diffuse soft tissue edema tracking along the right knee and lower leg. 4. Diffuse vascular calcifications seen. 5. Varices noted along the right lower leg. Electronically Signed   By: Garald Balding M.D.   On: 04/21/2015 21:16   Portable Chest 1 View  04/21/2015  CLINICAL DATA:  Right knee pain and swelling, worsening over the last 4 days. Cough. Elevated white count. EXAM: PORTABLE CHEST 1 VIEW COMPARISON:  03/25/2009 FINDINGS: There has been previous median sternotomy and CABG. The heart is enlarged. There is venous hypertension with interstitial edema worrisome for congestive heart failure. Patchy density at the lung bases could also represent pneumonia. No effusions. Bony structures are unremarkable. IMPRESSION: Interval CABG. Cardiomegaly. Pulmonary venous hypertension with interstitial edema. Patchy density at the bases could relate to edema and atelectasis or could represent mild basilar pneumonia. Electronically Signed   By: Nelson Chimes M.D.   On: 04/21/2015 23:18     CBC  Recent Labs Lab 04/21/15 1951 04/22/15 0115 04/23/15 0526  WBC 13.6* 12.3* 11.9*  HGB 13.8 13.9 13.1  HCT 42.0 41.5 39.4  PLT 192 204 198  MCV 92.7 93.0 93.4  MCH 30.5 31.2 31.0  MCHC 32.9 33.5 33.2  RDW 13.9 13.9 14.0  LYMPHSABS 2.0  --   --   MONOABS 1.6*  --   --   EOSABS  0.0  --   --   BASOSABS 0.0  --   --     Chemistries   Recent Labs Lab 04/21/15 1951 04/22/15 0115 04/23/15 0526  NA 132* 131* 134*  K 4.5 4.1 3.7  CL 97* 94* 101  CO2 25 27 26   GLUCOSE 109* 128* 113*  BUN 27* 29* 26*  CREATININE 0.85 0.91 0.73  CALCIUM 8.9 8.7* 8.3*  AST  --  58* 66*  ALT  --  39 48  ALKPHOS  --  134* 123  BILITOT  --  1.7* 1.1   ------------------------------------------------------------------------------------------------------------------ estimated creatinine clearance is 64.1 mL/min (by C-G formula based on Cr of 0.73). ------------------------------------------------------------------------------------------------------------------ No results for input(s): HGBA1C in the last 72 hours. ------------------------------------------------------------------------------------------------------------------ No results for input(s): CHOL, HDL, LDLCALC, TRIG, CHOLHDL, LDLDIRECT in the last 72 hours. ------------------------------------------------------------------------------------------------------------------ No results for input(s): TSH, T4TOTAL, T3FREE, THYROIDAB in the last 72 hours.  Invalid input(s): FREET3 ------------------------------------------------------------------------------------------------------------------ No results for input(s): VITAMINB12, FOLATE, FERRITIN, TIBC, IRON, RETICCTPCT in the last 72 hours.  Coagulation profile  Recent Labs Lab 04/21/15 2300  INR 1.41    No results for input(s): DDIMER in the last 72 hours.  Cardiac Enzymes No results for input(s): CKMB, TROPONINI, MYOGLOBIN in the last 168 hours.  Invalid input(s): CK ------------------------------------------------------------------------------------------------------------------ Invalid input(s): POCBNP   CBG:  Recent Labs Lab 04/22/15 0750 04/24/15 1320 04/25/15 0616 04/26/15 0758  GLUCAP 129* 136* 117* 100*       Studies: No results found.     No results found for: HGBA1C Lab Results  Component Value Date   LDLCALC 59 04/22/2015   CREATININE 0.73 04/23/2015       Scheduled Meds: . aspirin EC  81 mg Oral Daily  . [  START ON 04/28/2015] bupivacaine liposome  20 mL Infiltration Once  . calcium carbonate  1 tablet Oral BID WC  . carvedilol  3.125 mg Oral BID  . cefTAZidime (FORTAZ)  IV  2 g Intravenous Q8H  . chlorhexidine  60 mL Topical Once  . cholecalciferol  1,000 Units Oral Daily  . enoxaparin (LOVENOX) injection  40 mg Subcutaneous Q24H  . magnesium oxide  200 mg Oral BID  . methimazole  5 mg Oral Daily  . senna-docusate  1 tablet Oral BID  . simvastatin  10 mg Oral QHS  . [START ON 04/28/2015] tranexamic acid (CYKLOKAPRON) topical -INTRAOP  2,000 mg Topical To OR   Continuous Infusions: . sodium chloride 75 mL/hr at 04/25/15 2345    Principal Problem:   Septic joint of right knee joint ([place]) Active Problems:   Hypertension   Hyperlipidemia   Prostate cancer ([place])   CAD (coronary artery disease)   Septic arthritis of knee, right (HCC)   Cough   Hyperthyroidism   Pyogenic arthritis of right knee joint ([place])   Essential hypertension   Encounter for palliative care    Time spent: 45 minutes   [facility] Pager [phone]. If 7PM-7AM, please contact night-coverage at [email or web address], password [facility] 04/26/2015, 12:07 PM  LOS: 5 days

## 2015-04-26 NOTE — Progress Notes (Signed)
[facility]edic [facility]uma [facility] Progress Note  Subjective  Doing well, afebrile  Ready for surgery Eager to get it done  No specific complaints  ROS As above   Objective   BP 169/91 mmHg  Pulse 94  Temp(Src) 98.2 F (36.8 C) (Oral)  Resp 19  Ht 6\' 1"  (1.854 m)  Wt 78.6 kg (173 lb 4.5 oz)  BMI 22.87 kg/m2  SpO2 97%  Intake/Output      11/18 0701 - 11/19 0700 11/19 0701 - 11/20 0700   P.O. 840 360   IV Piggyback 200    Total Intake(mL/kg) 1040 (13.2) 360 (4.6)   Urine (mL/kg/hr) 1400 (0.7) 500 (0.8)   Stool 0 (0) 0 (0)   Total Output 1400 500   Net -360 -140        Urine Occurrence  0 x   Stool Occurrence 0 x 1 x     Exam  Gen: NAD, appears comfortable  Ext:       Right Lower extremity   Knee w/o erythema  + effusion  Distal motor and sensory functions intact  Ext warm  + DP pulse   Assessment and Plan    81 y/o male with septic knee s/p TKA  OR Monday with Dr. Ronnie Derby for revision Continue with current care  Continue IV abx per ID  Continue per medicine and cards services   Jari Pigg, PA-C [facility] [phone] (O) 04/26/2015 3:13 PM

## 2015-04-27 ENCOUNTER — Inpatient Hospital Stay (HOSPITAL_COMMUNITY): Payer: Medicare Other

## 2015-04-27 DIAGNOSIS — I2510 Atherosclerotic heart disease of native coronary artery without angina pectoris: Secondary | ICD-10-CM

## 2015-04-27 DIAGNOSIS — I824Y3 Acute embolism and thrombosis of unspecified deep veins of proximal lower extremity, bilateral: Secondary | ICD-10-CM

## 2015-04-27 DIAGNOSIS — I82409 Acute embolism and thrombosis of unspecified deep veins of unspecified lower extremity: Secondary | ICD-10-CM | POA: Diagnosis present

## 2015-04-27 DIAGNOSIS — R609 Edema, unspecified: Secondary | ICD-10-CM

## 2015-04-27 LAB — COMPREHENSIVE METABOLIC PANEL
ALK PHOS: 206 U/L — AB (ref 38–126)
ALT: 61 U/L (ref 17–63)
ANION GAP: 7 (ref 5–15)
AST: 62 U/L — ABNORMAL HIGH (ref 15–41)
Albumin: 1.7 g/dL — ABNORMAL LOW (ref 3.5–5.0)
BUN: 21 mg/dL — ABNORMAL HIGH (ref 6–20)
CALCIUM: 8.2 mg/dL — AB (ref 8.9–10.3)
CHLORIDE: 102 mmol/L (ref 101–111)
CO2: 24 mmol/L (ref 22–32)
Creatinine, Ser: 0.62 mg/dL (ref 0.61–1.24)
GFR calc non Af Amer: >60 mL/min (ref >60)
Glucose, Bld: 162 mg/dL — ABNORMAL HIGH (ref 65–99)
Potassium: 3.5 mmol/L (ref 3.5–5.1)
Sodium: 133 mmol/L — ABNORMAL LOW (ref 135–145)
Total Bilirubin: 1 mg/dL (ref 0.3–1.2)
Total Protein: 5.3 g/dL — ABNORMAL LOW (ref 6.5–8.1)

## 2015-04-27 LAB — CBC
HEMATOCRIT: 42.7 % (ref 39.0–52.0)
HEMATOCRIT: 41.3 % (ref 39.0–52.0)
HEMOGLOBIN: 14.2 g/dL (ref 13.0–17.0)
Hemoglobin: 13.7 g/dL (ref 13.0–17.0)
MCH: 31.1 pg (ref 26.0–34.0)
MCH: 31.1 pg (ref 26.0–34.0)
MCHC: 33.3 g/dL (ref 30.0–36.0)
MCHC: 33.2 g/dL (ref 30.0–36.0)
MCV: 93.4 fL (ref 78.0–100.0)
MCV: 93.7 fL (ref 78.0–100.0)
Platelets: 340 10*3/uL (ref 150–400)
Platelets: 337 10*3/uL (ref 150–400)
RBC: 4.57 MIL/uL (ref 4.22–5.81)
RBC: 4.41 MIL/uL (ref 4.22–5.81)
RDW: 13.9 % (ref 11.5–15.5)
RDW: 13.8 % (ref 11.5–15.5)
WBC: 14.2 10*3/uL — ABNORMAL HIGH (ref 4.0–10.5)
WBC: 13.1 10*3/uL — AB (ref 4.0–10.5)

## 2015-04-27 LAB — GLUCOSE, CAPILLARY: Glucose-Capillary: 118 mg/dL — ABNORMAL HIGH (ref 65–99)

## 2015-04-27 MED ORDER — HEPARIN (PORCINE) IN NACL 100-0.45 UNIT/ML-% IJ SOLN
1600.0000 [IU]/h | INTRAMUSCULAR | Status: DC
  Administered 2015-04-27: 1300 [IU]/h via INTRAVENOUS
  Filled 2015-04-27: qty 250

## 2015-04-27 MED ORDER — HYDROCODONE-ACETAMINOPHEN 7.5-325 MG PO TABS
1.0000 | ORAL_TABLET | Freq: Four times a day (QID) | ORAL | Status: DC | PRN
  Administered 2015-04-28 – 2015-04-29 (×4): 1 via ORAL
  Filled 2015-04-27 (×4): qty 1

## 2015-04-27 MED ORDER — CEFAZOLIN SODIUM-DEXTROSE 2-3 GM-% IV SOLR
2.0000 g | Freq: Once | INTRAVENOUS | Status: AC
  Administered 2015-04-28: 2 g via INTRAVENOUS
  Filled 2015-04-27: qty 50

## 2015-04-27 NOTE — Progress Notes (Signed)
ANTICOAGULATION CONSULT NOTE - Initial Consult  Pharmacy Consult for Heparin Indication: DVT  No Known Allergies  Patient Measurements: Height: 6\' 1"  (185.4 cm) Weight: 173 lb 4.5 oz (78.6 kg) IBW/kg (Calculated) : 79.9  Vital Signs: Temp: 97.6 F (36.4 C) (11/20 0822) Temp Source: Oral (11/20 0822) BP: 153/69 mmHg (11/20 0822) Pulse Rate: 94 (11/20 0822)  Labs:  Recent Labs  04/26/15 0805 04/27/15 0358 04/27/15 1045  HGB  --  14.2 13.7  HCT  --  42.7 41.3  PLT  --  340 337  CREATININE 0.69  --  0.62    Estimated Creatinine Clearance: 64.1 mL/min (by C-G formula based on Cr of 0.62).   Medical History: Past Medical History  Diagnosis Date  . Hypertension   . Hyperlipidemia   . Prostate cancer ([place])     3 YEARS AGO(BEING OBSERVED)  . Hyperthyroidism   . CAD (coronary artery disease)    Assessment: 89yom known to pharmacy from antibiotic dosing now with a new RLE DVT to begin IV heparin. Received a dose of prophylactic lovenox 40mg  at 1417 so will avoid bolus. Noted plan for OR tomorrow for TKA revision.  Goal of Therapy:  Heparin level 0.3-0.7 units/ml Monitor platelets by anticoagulation protocol: Yes   Plan:  1) Being heparin at 1300 units/hr with no bolus 2) Check 8 hour heparin level 3) Follow up anticoagulation plan after OR tomorrow  Deboraha Sprang 04/27/2015,4:39 PM

## 2015-04-27 NOTE — Progress Notes (Signed)
Orthopaedic Trauma Service Progress Note  Subjective  No complaints Ready for OR tomorrow   ROS As above   Objective   BP 153/69 mmHg  Pulse 94  Temp(Src) 97.6 F (36.4 C) (Oral)  Resp 18  Ht '6\' 1"'  (1.854 m)  Wt 78.6 kg (173 lb 4.5 oz)  BMI 22.87 kg/m2  SpO2 98%  Intake/Output      11/19 0701 - 11/20 0700 11/20 0701 - 11/21 0700   P.O. 360 240   I.V. (mL/kg) 600 (7.6) 300 (3.8)   IV Piggyback     Total Intake(mL/kg) 960 (12.2) 540 (6.9)   Urine (mL/kg/hr) 1550 (0.8) 200 (0.5)   Stool 0 (0) 0 (0)   Total Output 1550 200   Net -590 +340        Urine Occurrence 0 x 0 x   Stool Occurrence 1 x 0 x     Labs  Results for TIQUAN, BOUCH (MRN [id number]) as of 04/27/2015 12:16  Ref. Range 04/27/2015 10:45  Sodium Latest Ref Range: 135-145 mmol/L 133 (L)  Potassium Latest Ref Range: 3.5-5.1 mmol/L 3.5  Chloride Latest Ref Range: 101-111 mmol/L 102  CO2 Latest Ref Range: 22-32 mmol/L 24  BUN Latest Ref Range: 6-20 mg/dL 21 (H)  Creatinine Latest Ref Range: 0.61-1.24 mg/dL 0.62  Calcium Latest Ref Range: 8.9-10.3 mg/dL 8.2 (L)  EGFR (Non-African Amer.) Latest Ref Range: >60 mL/min >60  EGFR (African American) Latest Ref Range: >60 mL/min >60  Glucose Latest Ref Range: 65-99 mg/dL 162 (H)  Anion gap Latest Ref Range: 5-15  7  Alkaline Phosphatase Latest Ref Range: 38-126 U/L 206 (H)  Albumin Latest Ref Range: 3.5-5.0 g/dL 1.7 (L)  AST Latest Ref Range: 15-41 U/L 62 (H)  ALT Latest Ref Range: 17-63 U/L 61  Total Protein Latest Ref Range: 6.5-8.1 g/dL 5.3 (L)  Total Bilirubin Latest Ref Range: 0.3-1.2 mg/dL 1.0  WBC Latest Ref Range: 4.0-10.5 K/uL 13.1 (H)  RBC Latest Ref Range: 4.22-5.81 MIL/uL 4.41  Hemoglobin Latest Ref Range: 13.0-17.0 g/dL 13.7  HCT Latest Ref Range: 39.0-52.0 % 41.3  MCV Latest Ref Range: 78.0-100.0 fL 93.7  MCH Latest Ref Range: 26.0-34.0 pg 31.1  MCHC Latest Ref Range: 30.0-36.0 g/dL 33.2  RDW Latest Ref Range: 11.5-15.5 % 13.8  Platelets  Latest Ref Range: 150-400 K/uL 337    Exam   Gen: NAD, appears comfortable   Ext:        Right Lower extremity               Knee w/o erythema             + effusion             Distal motor and sensory functions intact             Ext warm             + DP pulse   Assessment and Plan    81 y/o male with septic knee s/p TKA  OR tomorrow with Dr. Ronnie Derby for revision Continue with current care   Continue IV abx per ID   Continue per medicine and cards services     Jari Pigg, PA-C [facility] [phone] (O) 04/27/2015 12:15 PM

## 2015-04-27 NOTE — Progress Notes (Signed)
Dr. Marlowe Sax texted re: Vascular Ultrasound findings.

## 2015-04-27 NOTE — Progress Notes (Signed)
ANTIBIOTIC CONSULT NOTE Pharmacy Consult for Ceftazidime Indication: Septic arthritis of right knee  No Known Allergies  Labs:  Recent Labs  04/26/15 0805 04/27/15 0358 04/27/15 1045  WBC  --  14.2* 13.1*  HGB  --  14.2 13.7  PLT  --  340 337  CREATININE 0.69  --  0.62    Microbiology: cx data: 11/14 R synovial fluid -> Pseudomonas  Abx: CTX: 11/14 >>11/16 Vancomycin 11/14 >>11/15 Ceftazidime 11/16>  Assessment: 89 yoM admitted directly from MD office with concern for  Septic R knee in setting of prosthetic appliance in place Knee fluid culture with Pseudomonas -> pan senstivite   Goal of Therapy:  Appropriate dosing  Plan:  Continue Ceftazidime 2 grams iv Q 8 hours for now (6 weeks planned) OR tomorrow Pharmacy to sign off and follow peripherally   Thank you Anette Guarneri, PharmD [phone]  04/27/2015, 1:51 PM

## 2015-04-27 NOTE — Progress Notes (Addendum)
*  PRELIMINARY RESULTS* Vascular Ultrasound Lower extremity venous duplex has been completed.  Preliminary findings: DVT noted involving the Right popliteal, Right gastroc, Right posterior tibial, Right peroneal, Left posterior tibial, and Left peroneal veins. Superficial thrombosis is noted in the left greater saphenous vein.  Called results to [place], Therapist, sports.   Landry Mellow, RDMS, RVT  04/27/2015, 3:58 PM

## 2015-04-27 NOTE — Progress Notes (Signed)
[facility] Progress Note    Patient: Anthony Osborn     [id number]  DOB: Jun 29, 1919     DOA: 04/21/2015 Date of Service: the patient was seen and examined on 04/27/2015 Day 6 of admission.  Subjective: The patient was confused earlier in the morning. Later in the evening the patient appears to be active baseline. Denies having any complaints of chest pain and abdominal pain nausea or vomiting. No diarrhea or constipation. Complains of right knee pain. Nutrition: Able to tolerate oral diet. Activity: Mostly bedridden due to pain. Last BM: 04/26/2015  Assessment and Plan: 1. Septic arthritis of knee, right ([place])  patient presents with right knee pain and swelling. synovial fluid analysis was positive for infection, culture were positive for Pseudomonas sensitive to [employer]. Infectious diseases consulted and recommended 6 weeks of IV antibiotics after right knee revision procedure planned on 04/29/2015. Patient was admitted by cardiology and recommended proceed with planned procedure if it is felt to be the best treatment option for his septic joint. Dr. Ronnie Derby continue to follow the patient.  2.Bilateral DVT. Patient was found to be having swelling of both legs Vascular Doppler was positive for bilateral DVT. Patient will be started on heparin. Most likely provoked DVT. We will discuss about prolonged anticoagulation after the procedure. PA from Orthopedic was informed about the anticoagulation.  3  CAD (coronary artery disease) Cardiology consulted. Continuing aspirin.  4. Community-acquired pneumonia. Continuing Higher education careers adviser.  5 chronic systolic CHF. Ejection fraction 25-30%. Continue close monitoring. Avoid aggressive IV hydration both pre-as well as intraoperatively.  6  Hyperthyroidism Continuing home medication.  7  Essential hypertension Blood pressure is stable continue to monitor.  8  Encounter for palliative care Appreciate input from palliative care. We'll  continue current management.  Nutrition: Nothing by mouth after midnight. Cardiac diet. Advance goals of care discussion: Full code  Brief Summary of Hospitalization:  HPI: From the H&P ''Anthony Osborn is a 81 y.o. male with PMH of hypertension, hyperlipidemia, remote prostate cancer, hypothyroidism, CAD, s/p of CABG, who presents with right knee swelling and pain.  The patient reports that he had a minor injury to his R knee at one year. In the past 4 days, he has been having swelling and pain over right knee joint. He also has mild fever, chills and sweating. He was seen by orthopedic surgeon, Dr. Leona Carry, who did aspiration and removed 75 mL of synovial fluid. Initial analysis showed no crystals, but with WBC 69330. Patient reports that he has mild cough with the yellow colored sputum production, no chest pain or shortness of breath. No abdominal pain, diarrhea, symptoms of UTI, unilateral weakness.  In ED, patient was found to have WBC 13.6, temperature 99.9, heart rate 95, electrolytes okay, renal functioning okay, lactate 1.7. Patient admitted to inpatient for further evaluation and treatment. Orthopedic surgeon, Dr. Percell Miller was consulted by ED.  CT of the right knee showed significant lucency about the tibial plateau, underlying the prostheses. This may reflect some degree of chronic loosening about the plate. Lucency underlying the femoral component of the prosthesis, and minimally underlying the patellofemoral component, also concerning for loosening. No definite acute fracture seen. moderate complex knee joint effusion, with mild loculation. This is markedly thick walled. The appearance suggests chronic synovial inflammation, though underlying infection cannot be excluded. Diffuse soft tissue edema tracking along the right knee and lower leg.Diffuse vascular calcifications seen. Varices noted along the right lower leg." Consultants: Cardiology, orthopedics, infectious disease Procedures:  Vascular Doppler, arthrocentesis  Antibiotics: Anti-infectives    Start     Dose/Rate Route Frequency Ordered Stop   04/28/15 1030  ceFAZolin (ANCEF) IVPB 2 g/50 mL premix     2 g 100 mL/hr over 30 Minutes Intravenous  Once 04/27/15 1204     04/25/15 0600  ceFAZolin (ANCEF) IVPB 2 g/50 mL premix  Status:  Discontinued     2 g 100 mL/hr over 30 Minutes Intravenous To ShortStay Surgical 04/24/15 2003 04/25/15 0931   04/23/15 1200  cefTAZidime (FORTAZ) 2 g in dextrose 5 % 50 mL IVPB     2 g 100 mL/hr over 30 Minutes Intravenous Every 8 hours 04/23/15 1122     04/22/15 2200  vancomycin (VANCOCIN) 1,250 mg in sodium chloride 0.9 % 250 mL IVPB  Status:  Discontinued     1,250 mg 166.7 mL/hr over 90 Minutes Intravenous Every 24 hours 04/21/15 2246 04/22/15 1419   04/21/15 2245  vancomycin (VANCOCIN) 1,750 mg in sodium chloride 0.9 % 500 mL IVPB     1,750 mg 250 mL/hr over 120 Minutes Intravenous  Once 04/21/15 2237 04/22/15 0309   04/21/15 2245  cefTRIAXone (ROCEPHIN) 2 g in dextrose 5 % 50 mL IVPB  Status:  Discontinued     2 g 100 mL/hr over 30 Minutes Intravenous Every 24 hours 04/21/15 2238 04/23/15 1122   04/21/15 2045  ceFAZolin (ANCEF) IVPB 1 g/50 mL premix     1 g 100 mL/hr over 30 Minutes Intravenous  Once 04/21/15 2038 04/21/15 2132     Family Communication: family was present at bedside, opportunity was given to ask question and all questions were answered satisfactorily at the time of interview.  Disposition:  Expected discharge date: 05/02/2015 Barriers to safe discharge: Surgery   Intake/Output Summary (Last 24 hours) at 04/27/15 1552 Last data filed at 04/27/15 1317  Gross per 24 hour  Intake   1380 ml  Output   1651 ml  Net   -271 ml   Filed Weights   04/21/15 2354 04/22/15 2112 04/23/15 2029  Weight: 79.1 kg (174 lb 6.1 oz) 78.2 kg (172 lb 6.4 oz) 78.6 kg (173 lb 4.5 oz)    Objective: Physical Exam: Filed Vitals:   04/26/15 1720 04/26/15 2019 04/27/15 0424  04/27/15 0822  BP: 127/59 153/74 169/85 153/69  Pulse: 81 87 98 94  Temp: 98.4 F (36.9 C) 98.2 F (36.8 C) 97.8 F (36.6 C) 97.6 F (36.4 C)  TempSrc: Oral Oral Oral Oral  Resp: 18 21 18 18   Height:      Weight:      SpO2: 95% 94% 93% 98%     General: Appear in mild distress, no Rash; Oral Mucosa moist. Cardiovascular: S1 and S2 Present, no Murmur, no JVD Respiratory: Bilateral Air entry present and Clear to Auscultation, no Crackles, no wheezes Abdomen: Bowel Sound present, Soft and no tenderness Extremities: Bilateral Pedal edema, no calf tenderness Neurology: Grossly no focal neuro deficit.  Data Reviewed: CBC:  Recent Labs Lab 04/21/15 1951 04/22/15 0115 04/23/15 0526 04/27/15 0358 04/27/15 1045  WBC 13.6* 12.3* 11.9* 14.2* 13.1*  NEUTROABS 10.0*  --   --   --   --   HGB 13.8 13.9 13.1 14.2 13.7  HCT 42.0 41.5 39.4 42.7 41.3  MCV 92.7 93.0 93.4 93.4 93.7  PLT 192 204 198 340 [id number]   Basic Metabolic Panel:  Recent Labs Lab 04/21/15 1951 04/22/15 0115 04/23/15 0526 04/26/15 0805 04/27/15 1045  NA 132* 131* 134* 135 133*  K 4.5 4.1 3.7 3.7 3.5  CL 97* 94* 101 101 102  CO2 25 27 26 28 24   GLUCOSE 109* 128* 113* 103* 162*  BUN 27* 29* 26* 19 21*  CREATININE 0.85 0.91 0.73 0.69 0.62  CALCIUM 8.9 8.7* 8.3* 8.3* 8.2*   Liver Function Tests:  Recent Labs Lab 04/22/15 0115 04/23/15 0526 04/26/15 0805 04/27/15 1045  AST 58* 66* 74* 62*  ALT 39 48 71* 61  ALKPHOS 134* 123 211* 206*  BILITOT 1.7* 1.1 1.1 1.0  PROT 5.6* 5.0* 5.2* 5.3*  ALBUMIN 2.3* 1.9* 1.6* 1.7*   No results for input(s): LIPASE, AMYLASE in the last 168 hours. No results for input(s): AMMONIA in the last 168 hours.  Cardiac Enzymes: No results for input(s): CKTOTAL, CKMB, CKMBINDEX, TROPONINI in the last 168 hours. BNP (last 3 results)  Recent Labs  04/22/15 0115  BNP 621.8*    ProBNP (last 3 results) No results for input(s): PROBNP in the last 8760  hours.   CBG:  Recent Labs Lab 04/22/15 0750 04/24/15 1320 04/25/15 0616 04/26/15 0758 04/27/15 0815  GLUCAP 129* 136* 117* 100* 118*    Recent Results (from the past 240 hour(s))  Culture, body fluid-bottle     Status: None   Collection Time: 04/21/15  8:02 PM  Result Value Ref Range Status   Specimen Description FLUID RIGHT KNEE  Final   Special Requests NONE  Final   Gram Stain   Final    GRAM NEGATIVE RODS CRITICAL RESULT CALLED TO, READ BACK BY AND VERIFIED WITH: IAnnamaria Helling RN 10:45 04/22/15 ([email or web address])    Culture PSEUDOMONAS AERUGINOSA  Final   Report Status 04/24/2015 FINAL  Final   Organism ID, Bacteria PSEUDOMONAS AERUGINOSA  Final      Susceptibility   Pseudomonas aeruginosa - MIC*    CEFTAZIDIME <=1 SENSITIVE Sensitive     CIPROFLOXACIN <=0.25 SENSITIVE Sensitive     GENTAMICIN <=1 SENSITIVE Sensitive     IMIPENEM 1 SENSITIVE Sensitive     PIP/TAZO <=4 SENSITIVE Sensitive     CEFEPIME <=1 SENSITIVE Sensitive     * PSEUDOMONAS AERUGINOSA  Gram stain     Status: None   Collection Time: 04/21/15  8:02 PM  Result Value Ref Range Status   Specimen Description FLUID RIGHT KNEE  Final   Special Requests NONE  Final   Gram Stain   Final    ABUNDANT WBC PRESENT, PREDOMINANTLY PMN NO ORGANISMS SEEN    Report Status 04/21/2015 FINAL  Final  Culture, blood (routine x 2)     Status: None   Collection Time: 04/21/15 10:50 PM  Result Value Ref Range Status   Specimen Description BLOOD LEFT ARM  Final   Special Requests BOTTLES DRAWN AEROBIC AND ANAEROBIC 5CC  Final   Culture NO GROWTH 5 DAYS  Final   Report Status 04/26/2015 FINAL  Final  Culture, blood (routine x 2)     Status: None   Collection Time: 04/21/15 11:00 PM  Result Value Ref Range Status   Specimen Description BLOOD LEFT FOREARM  Final   Special Requests BOTTLES DRAWN AEROBIC AND ANAEROBIC 10CC  Final   Culture NO GROWTH 5 DAYS  Final   Report Status 04/26/2015 FINAL  Final     Studies: No results  found.   Scheduled Meds: . aspirin EC  81 mg Oral Daily  . [START ON 04/28/2015] bupivacaine liposome  20 mL Infiltration Once  . calcium carbonate  1 tablet Oral BID WC  .  carvedilol  3.125 mg Oral BID  . [START ON 04/28/2015]  ceFAZolin (ANCEF) IV  2 g Intravenous Once  . cefTAZidime (FORTAZ)  IV  2 g Intravenous Q8H  . chlorhexidine  60 mL Topical Once  . cholecalciferol  1,000 Units Oral Daily  . enoxaparin (LOVENOX) injection  40 mg Subcutaneous Q24H  . magnesium oxide  200 mg Oral BID  . methimazole  5 mg Oral Daily  . senna-docusate  1 tablet Oral BID  . simvastatin  10 mg Oral QHS  . [START ON 04/28/2015] tranexamic acid (CYKLOKAPRON) topical -INTRAOP  2,000 mg Topical To OR   Continuous Infusions: . sodium chloride 75 mL/hr at 04/27/15 1139    Time spent: 35 minutes  Author: Berle Mull, MD [facility] Pager: [phone] 04/27/2015 3:52 PM  If 7PM-7AM, please contact night-coverage at [email or web address], password [facility]

## 2015-04-27 NOTE — Care Management Important Message (Signed)
Important Message  Patient Details  Name: Anthony Osborn MRN: [id number] Date of Birth: May 19, 1920   Medicare Important Message Given:  Yes    Hy Swiatek P Minh Jasper 04/27/2015, 10:12 AM

## 2015-04-28 ENCOUNTER — Encounter (HOSPITAL_COMMUNITY)
Admission: EM | Disposition: A | Discharge status: Skilled Nursing Facility | Payer: Self-pay | Source: Home / Self Care | Attending: Internal Medicine

## 2015-04-28 ENCOUNTER — Encounter (HOSPITAL_COMMUNITY): Payer: Self-pay | Admitting: Anesthesiology

## 2015-04-28 ENCOUNTER — Inpatient Hospital Stay (HOSPITAL_COMMUNITY): Payer: Medicare Other | Admitting: Anesthesiology

## 2015-04-28 HISTORY — PX: TOTAL KNEE REVISION: SHX996

## 2015-04-28 HISTORY — PX: INCISION AND DRAINAGE: SHX5863

## 2015-04-28 LAB — COMPREHENSIVE METABOLIC PANEL
ALBUMIN: 1.7 g/dL — AB (ref 3.5–5.0)
ALK PHOS: 177 U/L — AB (ref 38–126)
ALT: 54 U/L (ref 17–63)
AST: 55 U/L — AB (ref 15–41)
Anion gap: 7 (ref 5–15)
BILIRUBIN TOTAL: 0.7 mg/dL (ref 0.3–1.2)
BUN: 20 mg/dL (ref 6–20)
CALCIUM: 8.2 mg/dL — AB (ref 8.9–10.3)
CO2: 25 mmol/L (ref 22–32)
CREATININE: 0.64 mg/dL (ref 0.61–1.24)
Chloride: 104 mmol/L (ref 101–111)
GFR calc Af Amer: >60 mL/min (ref >60)
GLUCOSE: 99 mg/dL (ref 65–99)
Potassium: 4.2 mmol/L (ref 3.5–5.1)
Sodium: 136 mmol/L (ref 135–145)
TOTAL PROTEIN: 4.9 g/dL — AB (ref 6.5–8.1)

## 2015-04-28 LAB — CBC WITH DIFFERENTIAL/PLATELET
BASOS ABS: 0 10*3/uL (ref 0.0–0.1)
BASOS PCT: 0 %
EOS PCT: 5 %
Eosinophils Absolute: 0.7 10*3/uL (ref 0.0–0.7)
HEMATOCRIT: 39.3 % (ref 39.0–52.0)
HEMOGLOBIN: 13.1 g/dL (ref 13.0–17.0)
LYMPHS ABS: 2.7 10*3/uL (ref 0.7–4.0)
Lymphocytes Relative: 20 %
MCH: 31 pg (ref 26.0–34.0)
MCHC: 33.3 g/dL (ref 30.0–36.0)
MCV: 92.9 fL (ref 78.0–100.0)
MONO ABS: 1.1 10*3/uL — AB (ref 0.1–1.0)
Monocytes Relative: 8 %
Neutro Abs: 8.8 10*3/uL — ABNORMAL HIGH (ref 1.7–7.7)
Neutrophils Relative %: 67 %
Platelets: 319 10*3/uL (ref 150–400)
RBC: 4.23 MIL/uL (ref 4.22–5.81)
RDW: 14 % (ref 11.5–15.5)
WBC: 13.3 10*3/uL — AB (ref 4.0–10.5)

## 2015-04-28 LAB — SURGICAL PCR SCREEN
MRSA, PCR: NEGATIVE
Staphylococcus aureus: NEGATIVE

## 2015-04-28 LAB — PROTIME-INR
INR: 1.35 (ref 0.00–1.49)
Prothrombin Time: 16.8 seconds — ABNORMAL HIGH (ref 11.6–15.2)

## 2015-04-28 LAB — HEPARIN LEVEL (UNFRACTIONATED)
HEPARIN UNFRACTIONATED: 0.12 [IU]/mL — AB (ref 0.30–0.70)
Heparin Unfractionated: <0.1 IU/mL — ABNORMAL LOW (ref 0.30–0.70)

## 2015-04-28 SURGERY — INCISION AND DRAINAGE
Anesthesia: General | Site: Knee | Laterality: Right

## 2015-04-28 MED ORDER — LACTATED RINGERS IV SOLN
INTRAVENOUS | Status: DC | PRN
  Administered 2015-04-28 (×2): via INTRAVENOUS

## 2015-04-28 MED ORDER — BUPIVACAINE-EPINEPHRINE 0.5% -1:200000 IJ SOLN
INTRAMUSCULAR | Status: DC | PRN
  Administered 2015-04-28: 30 mL

## 2015-04-28 MED ORDER — FENTANYL CITRATE (PF) 100 MCG/2ML IJ SOLN
INTRAMUSCULAR | Status: DC | PRN
  Administered 2015-04-28 (×2): 100 ug via INTRAVENOUS

## 2015-04-28 MED ORDER — OXYCODONE HCL 5 MG/5ML PO SOLN: 5.0000 mg | Freq: Once | ORAL | Status: AC | PRN

## 2015-04-28 MED ORDER — BISACODYL 5 MG PO TBEC: 5.0000 mg | DELAYED_RELEASE_TABLET | Freq: Every day | ORAL | Status: DC | PRN

## 2015-04-28 MED ORDER — TOBRAMYCIN SULFATE 1.2 G IJ SOLR
INTRAMUSCULAR | Status: AC
  Filled 2015-04-28: qty 1.2

## 2015-04-28 MED ORDER — EPHEDRINE SULFATE 50 MG/ML IJ SOLN
INTRAMUSCULAR | Status: DC | PRN
  Administered 2015-04-28: 15 mg via INTRAVENOUS

## 2015-04-28 MED ORDER — METHOCARBAMOL 1000 MG/10ML IJ SOLN
500.0000 mg | Freq: Four times a day (QID) | INTRAVENOUS | Status: DC | PRN
  Filled 2015-04-28: qty 5

## 2015-04-28 MED ORDER — OXYCODONE HCL 5 MG PO TABS
5.0000 mg | ORAL_TABLET | Freq: Once | ORAL | Status: AC | PRN
  Administered 2015-04-28: 5 mg via ORAL

## 2015-04-28 MED ORDER — MENTHOL 3 MG MT LOZG: 1.0000 | LOZENGE | OROMUCOSAL | Status: DC | PRN

## 2015-04-28 MED ORDER — HYDROMORPHONE HCL 1 MG/ML IJ SOLN
INTRAMUSCULAR | Status: AC
  Administered 2015-04-28: 0.5 mg via INTRAVENOUS
  Filled 2015-04-28: qty 1

## 2015-04-28 MED ORDER — DIPHENHYDRAMINE HCL 12.5 MG/5ML PO ELIX: 12.5000 mg | ORAL_SOLUTION | ORAL | Status: DC | PRN

## 2015-04-28 MED ORDER — HEPARIN BOLUS VIA INFUSION
2000.0000 [IU] | Freq: Once | INTRAVENOUS | Status: AC
  Administered 2015-04-28: 2000 [IU] via INTRAVENOUS
  Filled 2015-04-28: qty 2000

## 2015-04-28 MED ORDER — FLEET ENEMA 7-19 GM/118ML RE ENEM: 1.0000 | ENEMA | Freq: Once | RECTAL | Status: DC | PRN

## 2015-04-28 MED ORDER — PHENYLEPHRINE HCL 10 MG/ML IJ SOLN
INTRAMUSCULAR | Status: DC | PRN
  Administered 2015-04-28 (×4): 80 ug via INTRAVENOUS

## 2015-04-28 MED ORDER — METOCLOPRAMIDE HCL 5 MG PO TABS: 5.0000 mg | ORAL_TABLET | Freq: Three times a day (TID) | ORAL | Status: DC | PRN

## 2015-04-28 MED ORDER — 0.9 % SODIUM CHLORIDE (POUR BTL) OPTIME
TOPICAL | Status: DC | PRN
  Administered 2015-04-28: 1000 mL

## 2015-04-28 MED ORDER — GLYCOPYRROLATE 0.2 MG/ML IJ SOLN
INTRAMUSCULAR | Status: DC | PRN
  Administered 2015-04-28: 0.6 mg via INTRAVENOUS

## 2015-04-28 MED ORDER — SODIUM CHLORIDE 0.9 % IR SOLN
Status: DC | PRN
  Administered 2015-04-28: 3000 mL

## 2015-04-28 MED ORDER — CELECOXIB 200 MG PO CAPS
200.0000 mg | ORAL_CAPSULE | Freq: Two times a day (BID) | ORAL | Status: DC
  Administered 2015-04-28 – 2015-04-30 (×4): 200 mg via ORAL
  Filled 2015-04-28 (×4): qty 1

## 2015-04-28 MED ORDER — NEOSTIGMINE METHYLSULFATE 10 MG/10ML IV SOLN
INTRAVENOUS | Status: DC | PRN
  Administered 2015-04-28: 4 mg via INTRAVENOUS

## 2015-04-28 MED ORDER — PROPOFOL 10 MG/ML IV BOLUS
INTRAVENOUS | Status: DC | PRN
  Administered 2015-04-28: 30 mg via INTRAVENOUS
  Administered 2015-04-28: 100 mg via INTRAVENOUS

## 2015-04-28 MED ORDER — METHOCARBAMOL 500 MG PO TABS: 500.0000 mg | ORAL_TABLET | Freq: Four times a day (QID) | ORAL | Status: DC | PRN

## 2015-04-28 MED ORDER — ONDANSETRON HCL 4 MG/2ML IJ SOLN: 4.0000 mg | Freq: Four times a day (QID) | INTRAMUSCULAR | Status: DC | PRN

## 2015-04-28 MED ORDER — PHENOL 1.4 % MT LIQD: 1.0000 | OROMUCOSAL | Status: DC | PRN

## 2015-04-28 MED ORDER — ALBUTEROL SULFATE HFA 108 (90 BASE) MCG/ACT IN AERS: 1.0000 | INHALATION_SPRAY | RESPIRATORY_TRACT | Status: DC | PRN

## 2015-04-28 MED ORDER — LACTATED RINGERS IV SOLN: INTRAVENOUS | Status: DC

## 2015-04-28 MED ORDER — SODIUM CHLORIDE 0.9 % IV SOLN
INTRAVENOUS | Status: DC
  Administered 2015-04-28: 22:00:00 via INTRAVENOUS

## 2015-04-28 MED ORDER — HEPARIN (PORCINE) IN NACL 100-0.45 UNIT/ML-% IJ SOLN
1400.0000 [IU]/h | INTRAMUSCULAR | Status: DC
  Administered 2015-04-29: 1400 [IU]/h via INTRAVENOUS
  Filled 2015-04-28: qty 250

## 2015-04-28 MED ORDER — OXYCODONE HCL 5 MG PO TABS
ORAL_TABLET | ORAL | Status: AC
  Administered 2015-04-28: 5 mg via ORAL
  Filled 2015-04-28: qty 1

## 2015-04-28 MED ORDER — ACETAMINOPHEN 160 MG/5ML PO SOLN
325.0000 mg | ORAL | Status: DC | PRN
  Filled 2015-04-28: qty 20.3

## 2015-04-28 MED ORDER — METOCLOPRAMIDE HCL 5 MG/ML IJ SOLN: 5.0000 mg | Freq: Three times a day (TID) | INTRAMUSCULAR | Status: DC | PRN

## 2015-04-28 MED ORDER — ACETAMINOPHEN 650 MG RE SUPP: 650.0000 mg | Freq: Four times a day (QID) | RECTAL | Status: DC | PRN

## 2015-04-28 MED ORDER — DOCUSATE SODIUM 100 MG PO CAPS
100.0000 mg | ORAL_CAPSULE | Freq: Two times a day (BID) | ORAL | Status: DC
  Administered 2015-04-28 – 2015-04-30 (×4): 100 mg via ORAL
  Filled 2015-04-28 (×4): qty 1

## 2015-04-28 MED ORDER — ZOLPIDEM TARTRATE 5 MG PO TABS: 5.0000 mg | ORAL_TABLET | Freq: Every evening | ORAL | Status: DC | PRN

## 2015-04-28 MED ORDER — FENTANYL CITRATE (PF) 250 MCG/5ML IJ SOLN
INTRAMUSCULAR | Status: AC
  Filled 2015-04-28: qty 5

## 2015-04-28 MED ORDER — ONDANSETRON HCL 4 MG/2ML IJ SOLN
INTRAMUSCULAR | Status: DC | PRN
  Administered 2015-04-28: 4 mg via INTRAVENOUS

## 2015-04-28 MED ORDER — ALUM & MAG HYDROXIDE-SIMETH 200-200-20 MG/5ML PO SUSP: 30.0000 mL | ORAL | Status: DC | PRN

## 2015-04-28 MED ORDER — ONDANSETRON HCL 4 MG PO TABS: 4.0000 mg | ORAL_TABLET | Freq: Four times a day (QID) | ORAL | Status: DC | PRN

## 2015-04-28 MED ORDER — ROCURONIUM BROMIDE 100 MG/10ML IV SOLN
INTRAVENOUS | Status: DC | PRN
  Administered 2015-04-28: 50 mg via INTRAVENOUS

## 2015-04-28 MED ORDER — ACETAMINOPHEN 325 MG PO TABS: 325.0000 mg | ORAL_TABLET | ORAL | Status: DC | PRN

## 2015-04-28 MED ORDER — BUPIVACAINE-EPINEPHRINE (PF) 0.5% -1:200000 IJ SOLN
INTRAMUSCULAR | Status: AC
  Filled 2015-04-28: qty 30

## 2015-04-28 MED ORDER — SENNOSIDES-DOCUSATE SODIUM 8.6-50 MG PO TABS
1.0000 | ORAL_TABLET | Freq: Every evening | ORAL | Status: DC | PRN
  Administered 2015-04-28: 1 via ORAL

## 2015-04-28 MED ORDER — ACETAMINOPHEN 325 MG PO TABS: 650.0000 mg | ORAL_TABLET | Freq: Four times a day (QID) | ORAL | Status: DC | PRN

## 2015-04-28 MED ORDER — FENTANYL CITRATE (PF) 100 MCG/2ML IJ SOLN: 25.0000 ug | INTRAMUSCULAR | Status: DC | PRN

## 2015-04-28 MED ORDER — ALBUTEROL SULFATE (2.5 MG/3ML) 0.083% IN NEBU: 2.5000 mg | INHALATION_SOLUTION | RESPIRATORY_TRACT | Status: DC | PRN

## 2015-04-28 MED ORDER — BUPIVACAINE-EPINEPHRINE (PF) 0.25% -1:200000 IJ SOLN
INTRAMUSCULAR | Status: AC
  Filled 2015-04-28: qty 30

## 2015-04-28 MED ORDER — HYDROMORPHONE HCL 1 MG/ML IJ SOLN
0.2500 mg | INTRAMUSCULAR | Status: DC | PRN
  Administered 2015-04-28 (×4): 0.5 mg via INTRAVENOUS

## 2015-04-28 SURGICAL SUPPLY — 85 items
ARTI SURFACE 23 SZ EF (Knees) ×1 IMPLANT
AUGMENT DISTAL SZ E 10 (Orthopedic Implant) ×1 IMPLANT
BANDAGE ELASTIC 6 VELCRO ST LF (GAUZE/BANDAGES/DRESSINGS) ×1 IMPLANT
BANDAGE ESMARK 6X9 LF (GAUZE/BANDAGES/DRESSINGS) ×1 IMPLANT
BLADE SAGITTAL 13X1.27X60 (BLADE) ×2 IMPLANT
BLADE SAW SGTL 13X75X1.27 (BLADE) ×2 IMPLANT
BLADE SAW SGTL 83.5X18.5 (BLADE) ×2 IMPLANT
BLADE SAW SGTL NAR THIN XSHT (BLADE) ×2 IMPLANT
BLADE SURG 10 STRL SS (BLADE) ×6 IMPLANT
BNDG CMPR 9X6 STRL LF SNTH (GAUZE/BANDAGES/DRESSINGS) ×1
BNDG ESMARK 6X9 LF (GAUZE/BANDAGES/DRESSINGS) ×2
BONE CEMENT PALACOS R-G (Orthopedic Implant) ×4 IMPLANT
BOWL SMART MIX CTS (DISPOSABLE) IMPLANT
CEMENT BONE PALACOS R-G (Orthopedic Implant) IMPLANT
COMP PATELLA NEXGEN 38 (Orthopedic Implant) ×1 IMPLANT
COMP PATELLAR 38 STD 9.5 THK (Orthopedic Implant) IMPLANT
COVER SURGICAL LIGHT HANDLE (MISCELLANEOUS) ×2 IMPLANT
CUFF TOURNIQUET SINGLE 34IN LL (TOURNIQUET CUFF) IMPLANT
DISTAL AUG ZIMMER (Orthopedic Implant) ×1 IMPLANT
DRAPE EXTREMITY T 121X128X90 (DRAPE) ×2 IMPLANT
DRAPE IMP U-DRAPE 54X76 (DRAPES) ×2 IMPLANT
DRAPE INCISE IOBAN 66X45 STRL (DRAPES) ×4 IMPLANT
DRAPE PROXIMA HALF (DRAPES) ×2 IMPLANT
DRAPE U-SHAPE 47X51 STRL (DRAPES) ×2 IMPLANT
DRSG ADAPTIC 3X8 NADH LF (GAUZE/BANDAGES/DRESSINGS) ×2 IMPLANT
DRSG PAD ABDOMINAL 8X10 ST (GAUZE/BANDAGES/DRESSINGS) ×2 IMPLANT
DURAPREP 26ML APPLICATOR (WOUND CARE) ×4 IMPLANT
ELECT REM PT RETURN 9FT ADLT (ELECTROSURGICAL) ×2
ELECTRODE REM PT RTRN 9FT ADLT (ELECTROSURGICAL) ×1 IMPLANT
EVACUATOR 1/8 PVC DRAIN (DRAIN) ×2 IMPLANT
FACESHIELD WRAPAROUND (MASK) ×2 IMPLANT
FACESHIELD WRAPAROUND OR TEAM (MASK) ×1 IMPLANT
FEMORAL COMP ZIMMER (Orthopedic Implant) ×1 IMPLANT
GAUZE SPONGE 4X4 12PLY STRL (GAUZE/BANDAGES/DRESSINGS) ×2 IMPLANT
GLOVE BIOGEL M 7.0 STRL (GLOVE) ×2 IMPLANT
GLOVE BIOGEL PI IND STRL 7.5 (GLOVE) IMPLANT
GLOVE BIOGEL PI IND STRL 8.5 (GLOVE) ×1 IMPLANT
GLOVE BIOGEL PI INDICATOR 7.5 (GLOVE)
GLOVE BIOGEL PI INDICATOR 8.5 (GLOVE) ×1
GLOVE BIOGEL PI ORTHO PRO SZ8 (GLOVE) ×1
GLOVE PI ORTHO PRO STRL SZ8 (GLOVE) ×1 IMPLANT
GLOVE SURG ORTHO 8.0 STRL STRW (GLOVE) ×4 IMPLANT
GOWN STRL REUS W/ TWL LRG LVL3 (GOWN DISPOSABLE) ×2 IMPLANT
GOWN STRL REUS W/ TWL XL LVL3 (GOWN DISPOSABLE) ×2 IMPLANT
GOWN STRL REUS W/TWL LRG LVL3 (GOWN DISPOSABLE) ×4
GOWN STRL REUS W/TWL XL LVL3 (GOWN DISPOSABLE) ×4
HANDPIECE INTERPULSE COAX TIP (DISPOSABLE)
HOOD PEEL AWAY FACE SHEILD DIS (HOOD) ×8 IMPLANT
KIT BASIN OR (CUSTOM PROCEDURE TRAY) ×2 IMPLANT
KIT ROOM TURNOVER OR (KITS) ×2 IMPLANT
MANIFOLD NEPTUNE II (INSTRUMENTS) ×2 IMPLANT
NDL 18GX1X1/2 (RX/OR ONLY) (NEEDLE) IMPLANT
NEEDLE 18GX1X1/2 (RX/OR ONLY) (NEEDLE) IMPLANT
NEEDLE 22X1 1/2 (OR ONLY) (NEEDLE) ×4 IMPLANT
NS IRRIG 1000ML POUR BTL (IV SOLUTION) ×2 IMPLANT
PACK TOTAL JOINT (CUSTOM PROCEDURE TRAY) ×2 IMPLANT
PACK UNIVERSAL I (CUSTOM PROCEDURE TRAY) ×2 IMPLANT
PAD ARMBOARD 7.5X6 YLW CONV (MISCELLANEOUS) ×4 IMPLANT
PADDING CAST COTTON 6X4 STRL (CAST SUPPLIES) ×2 IMPLANT
PATELLA NEXGEN 38MM (Orthopedic Implant) ×2 IMPLANT
PLATE TIBIA STEMMED SZ6 (Plate) ×1 IMPLANT
SET HNDPC FAN SPRY TIP SCT (DISPOSABLE) IMPLANT
SPONGE GAUZE 4X4 12PLY STER LF (GAUZE/BANDAGES/DRESSINGS) ×1 IMPLANT
STAPLER VISISTAT 35W (STAPLE) ×2 IMPLANT
STEM ST EXT ZIER 100X145X12X (Stem) IMPLANT
STEM ST EXT ZIER 100X145X14X (Stem) IMPLANT
STEM ST EXT ZIMMER (Stem) ×4 IMPLANT
SUCTION FRAZIER TIP 10 FR DISP (SUCTIONS) ×2 IMPLANT
SUT BONE WAX W31G (SUTURE) ×2 IMPLANT
SUT PDS AB 0 CT 36 (SUTURE) IMPLANT
SUT PDS AB 1 CT  36 (SUTURE)
SUT PDS AB 1 CT 36 (SUTURE) IMPLANT
SUT PDS AB 2-0 CT1 27 (SUTURE) IMPLANT
SUT VIC AB 0 CTB1 27 (SUTURE) IMPLANT
SUT VIC AB 1 CT1 27 (SUTURE)
SUT VIC AB 1 CT1 27XBRD ANBCTR (SUTURE) IMPLANT
SUT VIC AB 2-0 CTB1 (SUTURE) IMPLANT
SWAB COLLECTION DEVICE MRSA (MISCELLANEOUS) ×2 IMPLANT
SYR 20CC LL (SYRINGE) ×4 IMPLANT
SYR CONTROL 10ML LL (SYRINGE) ×4 IMPLANT
TOWEL OR 17X24 6PK STRL BLUE (TOWEL DISPOSABLE) ×2 IMPLANT
TOWEL OR 17X26 10 PK STRL BLUE (TOWEL DISPOSABLE) ×2 IMPLANT
TRAY FOLEY CATH 16FRSI W/METER (SET/KITS/TRAYS/PACK) ×2 IMPLANT
TUBE ANAEROBIC SPECIMEN COL (MISCELLANEOUS) IMPLANT
WATER STERILE IRR 1000ML POUR (IV SOLUTION) ×6 IMPLANT

## 2015-04-28 NOTE — Progress Notes (Addendum)
[facility] Progress Note    Patient: Anthony Osborn    [id number]  DOB: March 22, 1920     DOA: 04/21/2015 Date of Service: the patient was seen and examined on 04/28/2015 Day 7 of admission.  Subjective: Patient denies any new acute pain. No nausea no vomiting. Tolerating diet oral. No diarrhea. No abdominal pain. Nutrition: Able to tolerate oral diet. Activity: Continues to remain bedridden due to pain. Last BM: 04/26/2015  Assessment and Plan: 1. Septic arthritis of knee, right ([place])  patient presents with right knee pain and swelling. synovial fluid analysis was positive for infection, culture were positive for Pseudomonas sensitive to [employer]. Infectious diseases consulted and recommended 6 weeks of IV antibiotics after right knee revision procedure planned on 04/28/2015. Patient was evaluated cardiology and recommended proceed with planned procedure if it is felt to be the best treatment option for his septic joint. Dr. Ronnie Derby continue to follow the patient.  2.Bilateral DVT. Patient was found to be having swelling of both legs Vascular Doppler was positive for bilateral DVT. Holding heparin for surgery Orthopedic recommends resume heparin 8:00 in the morning for therapeutic dose on 04/29/2015 Most likely provoked DVT. We will discuss about prolonged anticoagulation after the procedure.  3  CAD (coronary artery disease) Cardiology consulted. Continuing aspirin.  4. Community-acquired pneumonia. Continuing Higher education careers adviser.  5 chronic systolic CHF. Ejection fraction 25-30%. Continue close monitoring. Need to monitor ins and outs and daily weight  6  Hyperthyroidism Continuing home medication.  7  Essential hypertension Blood pressure is stable continue to monitor.  8  Encounter for palliative care Appreciate input from palliative care. We'll continue current management.  Nutrition: Nothing by mouth after midnight. Cardiac diet. Advance goals of care discussion: Full  code  Brief Summary of Hospitalization:  HPI: From the H&P ''Anthony Osborn is a 81 y.o. male with PMH of hypertension, hyperlipidemia, remote prostate cancer, hypothyroidism, CAD, s/p of CABG, who presents with right knee swelling and pain.  The patient reports that he had a minor injury to his R knee at one year. In the past 4 days, he has been having swelling and pain over right knee joint. He also has mild fever, chills and sweating. He was seen by orthopedic surgeon, Dr. Leona Carry, who did aspiration and removed 75 mL of synovial fluid. Initial analysis showed no crystals, but with WBC 69330. Patient reports that he has mild cough with the yellow colored sputum production, no chest pain or shortness of breath. No abdominal pain, diarrhea, symptoms of UTI, unilateral weakness.  In ED, patient was found to have WBC 13.6, temperature 99.9, heart rate 95, electrolytes okay, renal functioning okay, lactate 1.7. Patient admitted to inpatient for further evaluation and treatment. Orthopedic surgeon, Dr. Percell Miller was consulted by ED.  CT of the right knee showed significant lucency about the tibial plateau, underlying the prostheses. This may reflect some degree of chronic loosening about the plate. Lucency underlying the femoral component of the prosthesis, and minimally underlying the patellofemoral component, also concerning for loosening. No definite acute fracture seen. moderate complex knee joint effusion, with mild loculation. This is markedly thick walled. The appearance suggests chronic synovial inflammation, though underlying infection cannot be excluded. Diffuse soft tissue edema tracking along the right knee and lower leg.Diffuse vascular calcifications seen. Varices noted along the right lower leg." Consultants: Cardiology, orthopedics, infectious disease Procedures: Vascular Doppler, arthrocentesis Antibiotics: Anti-infectives    Start     Dose/Rate Route Frequency Ordered Stop   04/28/15 1030   [  MAR Hold]  ceFAZolin (ANCEF) IVPB 2 g/50 mL premix     (MAR Hold since 04/28/15 0929)   2 g 100 mL/hr over 30 Minutes Intravenous  Once 04/27/15 1204 04/28/15 1208   04/25/15 0600  ceFAZolin (ANCEF) IVPB 2 g/50 mL premix  Status:  Discontinued     2 g 100 mL/hr over 30 Minutes Intravenous To ShortStay Surgical 04/24/15 2003 04/25/15 0931   04/23/15 1200  [MAR Hold]  cefTAZidime (FORTAZ) 2 g in dextrose 5 % 50 mL IVPB     (MAR Hold since 04/28/15 0929)   2 g 100 mL/hr over 30 Minutes Intravenous Every 8 hours 04/23/15 1122     04/22/15 2200  vancomycin (VANCOCIN) 1,250 mg in sodium chloride 0.9 % 250 mL IVPB  Status:  Discontinued     1,250 mg 166.7 mL/hr over 90 Minutes Intravenous Every 24 hours 04/21/15 2246 04/22/15 1419   04/21/15 2245  vancomycin (VANCOCIN) 1,750 mg in sodium chloride 0.9 % 500 mL IVPB     1,750 mg 250 mL/hr over 120 Minutes Intravenous  Once 04/21/15 2237 04/22/15 0309   04/21/15 2245  cefTRIAXone (ROCEPHIN) 2 g in dextrose 5 % 50 mL IVPB  Status:  Discontinued     2 g 100 mL/hr over 30 Minutes Intravenous Every 24 hours 04/21/15 2238 04/23/15 1122   04/21/15 2045  ceFAZolin (ANCEF) IVPB 1 g/50 mL premix     1 g 100 mL/hr over 30 Minutes Intravenous  Once 04/21/15 2038 04/21/15 2132     Family Communication: family was present at bedside, opportunity was given to ask question and all questions were answered satisfactorily at the time of interview.  Disposition:  Expected discharge date: 05/02/2015 Barriers to safe discharge: Surgery 04/28/2015   Intake/Output Summary (Last 24 hours) at 04/28/15 1633 Last data filed at 04/28/15 1430  Gross per 24 hour  Intake 1575.78 ml  Output   1690 ml  Net -114.22 ml   Filed Weights   04/22/15 2112 04/23/15 2029 04/27/15 2056  Weight: 78.2 kg (172 lb 6.4 oz) 78.6 kg (173 lb 4.5 oz) 78.7 kg (173 lb 8 oz)    Objective: Physical Exam: Filed Vitals:   04/28/15 1458 04/28/15 1500 04/28/15 1515 04/28/15 1545  BP:    135/74   Pulse: 82 82 80 78  Temp:      TempSrc:      Resp: 20 18 18 22   Height:      Weight:      SpO2: 93% 95% 94% 96%    General: Appear in mild distress, no Rash; Oral Mucosa moist. Cardiovascular: S1 and S2 Present, no Murmur, no JVD Respiratory: Bilateral Air entry present and Clear to Auscultation, no Crackles, no wheezes Abdomen: Bowel Sound present, Soft and no tenderness Extremities: Bilateral Pedal edema, Neurology: Grossly no focal neuro deficit.  Data Reviewed: CBC:  Recent Labs Lab 04/21/15 1951 04/22/15 0115 04/23/15 0526 04/27/15 0358 04/27/15 1045 04/28/15 0222  WBC 13.6* 12.3* 11.9* 14.2* 13.1* 13.3*  NEUTROABS 10.0*  --   --   --   --  8.8*  HGB 13.8 13.9 13.1 14.2 13.7 13.1  HCT 42.0 41.5 39.4 42.7 41.3 39.3  MCV 92.7 93.0 93.4 93.4 93.7 92.9  PLT 192 204 198 340 337 [id number]   Basic Metabolic Panel:  Recent Labs Lab 04/22/15 0115 04/23/15 0526 04/26/15 0805 04/27/15 1045 04/28/15 0222  NA 131* 134* 135 133* 136  K 4.1 3.7 3.7 3.5 4.2  CL 94* 101  101 102 104  CO2 27 26 28 24 25   GLUCOSE 128* 113* 103* 162* 99  BUN 29* 26* 19 21* 20  CREATININE 0.91 0.73 0.69 0.62 0.64  CALCIUM 8.7* 8.3* 8.3* 8.2* 8.2*   Liver Function Tests:  Recent Labs Lab 04/22/15 0115 04/23/15 0526 04/26/15 0805 04/27/15 1045 04/28/15 0222  AST 58* 66* 74* 62* 55*  ALT 39 48 71* 61 54  ALKPHOS 134* 123 211* 206* 177*  BILITOT 1.7* 1.1 1.1 1.0 0.7  PROT 5.6* 5.0* 5.2* 5.3* 4.9*  ALBUMIN 2.3* 1.9* 1.6* 1.7* 1.7*   No results for input(s): LIPASE, AMYLASE in the last 168 hours. No results for input(s): AMMONIA in the last 168 hours.  Cardiac Enzymes: No results for input(s): CKTOTAL, CKMB, CKMBINDEX, TROPONINI in the last 168 hours. BNP (last 3 results)  Recent Labs  04/22/15 0115  BNP 621.8*    ProBNP (last 3 results) No results for input(s): PROBNP in the last 8760 hours.   CBG:  Recent Labs Lab 04/22/15 0750 04/24/15 1320 04/25/15 0616  04/26/15 0758 04/27/15 0815  GLUCAP 129* 136* 117* 100* 118*    Recent Results (from the past 240 hour(s))  Culture, body fluid-bottle     Status: None   Collection Time: 04/21/15  8:02 PM  Result Value Ref Range Status   Specimen Description FLUID RIGHT KNEE  Final   Special Requests NONE  Final   Gram Stain   Final    GRAM NEGATIVE RODS CRITICAL RESULT CALLED TO, READ BACK BY AND VERIFIED WITH: IAnnamaria Helling RN 10:45 04/22/15 ([email or web address])    Culture PSEUDOMONAS AERUGINOSA  Final   Report Status 04/24/2015 FINAL  Final   Organism ID, Bacteria PSEUDOMONAS AERUGINOSA  Final      Susceptibility   Pseudomonas aeruginosa - MIC*    CEFTAZIDIME <=1 SENSITIVE Sensitive     CIPROFLOXACIN <=0.25 SENSITIVE Sensitive     GENTAMICIN <=1 SENSITIVE Sensitive     IMIPENEM 1 SENSITIVE Sensitive     PIP/TAZO <=4 SENSITIVE Sensitive     CEFEPIME <=1 SENSITIVE Sensitive     * PSEUDOMONAS AERUGINOSA  Gram stain     Status: None   Collection Time: 04/21/15  8:02 PM  Result Value Ref Range Status   Specimen Description FLUID RIGHT KNEE  Final   Special Requests NONE  Final   Gram Stain   Final    ABUNDANT WBC PRESENT, PREDOMINANTLY PMN NO ORGANISMS SEEN    Report Status 04/21/2015 FINAL  Final  Culture, blood (routine x 2)     Status: None   Collection Time: 04/21/15 10:50 PM  Result Value Ref Range Status   Specimen Description BLOOD LEFT ARM  Final   Special Requests BOTTLES DRAWN AEROBIC AND ANAEROBIC 5CC  Final   Culture NO GROWTH 5 DAYS  Final   Report Status 04/26/2015 FINAL  Final  Culture, blood (routine x 2)     Status: None   Collection Time: 04/21/15 11:00 PM  Result Value Ref Range Status   Specimen Description BLOOD LEFT FOREARM  Final   Special Requests BOTTLES DRAWN AEROBIC AND ANAEROBIC 10CC  Final   Culture NO GROWTH 5 DAYS  Final   Report Status 04/26/2015 FINAL  Final  Surgical PCR screen     Status: None   Collection Time: 04/28/15  9:17 AM  Result Value Ref Range Status    MRSA, PCR NEGATIVE NEGATIVE Final   Staphylococcus aureus NEGATIVE NEGATIVE Final    Comment:  The Xpert SA Assay (FDA approved for NASAL specimens in patients over 55 years of age), is one component of a comprehensive surveillance program.  Test performance has been validated by [facility] for patients greater than or equal to 62 year old. It is not intended to diagnose infection nor to guide or monitor treatment.      Studies: No results found.   Scheduled Meds: . [MAR Hold] aspirin EC  81 mg Oral Daily  . [MAR Hold] carvedilol  3.125 mg Oral BID  . [MAR Hold] cefTAZidime (FORTAZ)  IV  2 g Intravenous Q8H  . chlorhexidine  60 mL Topical Once  . [MAR Hold] methimazole  5 mg Oral Daily  . [MAR Hold] senna-docusate  1 tablet Oral BID  . [MAR Hold] simvastatin  10 mg Oral QHS   Continuous Infusions: . sodium chloride Stopped (04/28/15 0929)  . lactated ringers      Time spent: 30 minutes  Author: Berle Mull, MD [facility] Pager: [phone] 04/28/2015 4:33 PM  If 7PM-7AM, please contact night-coverage at [email or web address], password [facility]

## 2015-04-28 NOTE — Anesthesia Preprocedure Evaluation (Signed)
Anesthesia Evaluation  Patient identified by MRN, date of birth, ID band Patient awake    Reviewed: Allergy & Precautions, NPO status , Patient's Chart, lab work & pertinent test results, reviewed documented beta blocker date and time   History of Anesthesia Complications Negative for: history of anesthetic complications  Airway Mallampati: II  TM Distance: >3 FB Neck ROM: Full    Dental no notable dental hx.    Pulmonary neg pulmonary ROS,    breath sounds clear to auscultation       Cardiovascular hypertension, Pt. on medications and Pt. on home beta blockers (-) angina+ CAD, + CABG and +CHF  + Valvular Problems/Murmurs AI  Rhythm:Regular     Neuro/Psych negative neurological ROS  negative psych ROS   GI/Hepatic negative GI ROS, Neg liver ROS,   Endo/Other  Hyperthyroidism   Renal/GU      Musculoskeletal  (+) Arthritis ,   Abdominal   Peds  Hematology Right septic knee   Anesthesia Other Findings B/l dvt on heparin gtt held for surgery  Reproductive/Obstetrics                             Anesthesia Physical Anesthesia Plan  ASA: III  Anesthesia Plan: General   Post-op Pain Management:    Induction: Intravenous  Airway Management Planned: Oral ETT  Additional Equipment: None  Intra-op Plan:   Post-operative Plan: Extubation in OR  Informed Consent: I have reviewed the patients History and Physical, chart, labs and discussed the procedure including the risks, benefits and alternatives for the proposed anesthesia with the patient or authorized representative who has indicated his/her understanding and acceptance.   Dental advisory given  Plan Discussed with: CRNA and Surgeon  Anesthesia Plan Comments:         Anesthesia Quick Evaluation

## 2015-04-28 NOTE — Progress Notes (Signed)
OT Cancellation Note  Patient Details Name: Anthony Osborn MRN: [id number] DOB: [birth year]   Cancelled Treatment:    Reason Eval/Treat Not Completed: Other (comment) (Surg this Am per notes 11:15 )  Vonita Moss   OTR/L Pager: [phone] Office: [phone] .  04/28/2015, 7:09 AM

## 2015-04-28 NOTE — Transfer of Care (Signed)
Immediate Anesthesia Transfer of Care Note  Patient: Anthony Osborn  Procedure(s) Performed: Procedure(s): INCISION AND DRAINAGE (Right) TOTAL KNEE REVISION (Right)  Patient Location: PACU  Anesthesia Type:General  Level of Consciousness: awake, alert , oriented and patient cooperative  Airway & Oxygen Therapy: Patient Spontanous Breathing and Patient connected to face mask oxygen  Post-op Assessment: Report given to RN and Post -op Vital signs reviewed and stable  Post vital signs: Reviewed and stable  Last Vitals:  Filed Vitals:   04/28/15 0840 04/28/15 1016  BP: 159/73 162/73  Pulse: 83 80  Temp: 36.7 C   Resp: 18     Complications: No apparent anesthesia complications

## 2015-04-28 NOTE — Anesthesia Postprocedure Evaluation (Signed)
Anesthesia Post Note  Patient: Anthony Osborn  Procedure(s) Performed: Procedure(s) (LRB): INCISION AND DRAINAGE (Right) TOTAL KNEE REVISION (Right)  Patient location during evaluation: PACU Anesthesia Type: General Level of consciousness: awake and alert Pain management: pain level controlled Vital Signs Assessment: post-procedure vital signs reviewed and stable Respiratory status: spontaneous breathing, nonlabored ventilation, respiratory function stable and patient connected to nasal cannula oxygen Cardiovascular status: blood pressure returned to baseline and stable Postop Assessment: No signs of nausea or vomiting Anesthetic complications: no    Last Vitals:  Filed Vitals:   04/28/15 1515 04/28/15 1545  BP: 135/74   Pulse: 80 78  Temp:    Resp: 18 22    Last Pain:  Filed Vitals:   04/28/15 1551  PainSc: 10-Worst pain ever                 Zenaida Deed

## 2015-04-28 NOTE — H&P (Signed)
Anthony Osborn MRN:  [id number] DOB/SEX:  06-Sep-1919/male  CHIEF COMPLAINT:  Painful right Knee  HISTORY: Patient is a 81 y.o. male presented with a history of pain in the right knee. Onset of symptoms was abrupt starting several days ago with rapidly worsening course since that time. Prior procedures on the knee include arthroplasty. Patient has been treated conservatively with over-the-counter NSAIDs and activity modification. Patient currently rates pain in the knee at 5 out of 10 with activity. There is no pain at night.  PAST MEDICAL HISTORY: Patient Active Problem List   Diagnosis Date Noted  . DVT (deep venous thrombosis) ([place]) 04/27/2015  . Pyogenic arthritis of right knee joint ([place])   . Essential hypertension   . Encounter for palliative care   . CAD (coronary artery disease) 04/21/2015  . Septic arthritis of knee, right ([place]) 04/21/2015  . Cough 04/21/2015  . Hyperlipidemia   . Prostate cancer ([place])   . Hyperthyroidism    Past Medical History  Diagnosis Date  . Hypertension   . Hyperlipidemia   . Prostate cancer ([place])     3 YEARS AGO(BEING OBSERVED)  . Hyperthyroidism   . CAD (coronary artery disease)    Past Surgical History  Procedure Laterality Date  . Bilateral knee arthroscopy    . Rotator cuff repair      LEFT  . Cholecystectomy    . Coronary artery bypass graft       MEDICATIONS:   Prescriptions prior to admission  Medication Sig Dispense Refill Last Dose  . acetaminophen (TYLENOL) 500 MG tablet Take 500 mg by mouth every 6 (six) hours as needed for moderate pain.   04/21/2015 at Unknown time  . aspirin 81 MG tablet Take 81 mg by mouth daily.     04/21/2015 at Unknown time  . CALCIUM-MAGNESIUM-ZINC PO Take 1 tablet by mouth 3 (three) times daily.    04/21/2015 at Unknown time  . carvedilol (COREG) 3.125 MG tablet Take 3.125 mg by mouth 2 (two) times daily.   04/21/2015 at Unknown time  . methimazole (TAPAZOLE) 10 MG tablet Take 5 mg by mouth daily.    04/21/2015 at Unknown time  . simvastatin (ZOCOR) 10 MG tablet Take 10 mg by mouth at bedtime.     04/20/2015 at Unknown time  . carvedilol (COREG) 12.5 MG tablet Take 12.5 mg by mouth 2 (two) times daily.       . Cholecalciferol (VITAMIN D-3 PO) Take 1,000 Units by mouth daily.       Marland Kitchen lisinopril-hydrochlorothiazide (PRINZIDE,ZESTORETIC) 10-12.5 MG per tablet Take 1 tablet by mouth daily.         ALLERGIES:  No Known Allergies  REVIEW OF SYSTEMS:  Pertinent items noted in HPI and remainder of comprehensive ROS otherwise negative.   FAMILY HISTORY:   Family History  Problem Relation Age of Onset  . Stroke Father   . Heart disease Brother   . Diabetes Sister     SOCIAL HISTORY:   Social History  Substance Use Topics  . Smoking status: Never Smoker   . Smokeless tobacco: Not on file  . Alcohol Use: No     EXAMINATION:  Vital signs in last 24 hours: Temp:  [97.6 F (36.4 C)-98.2 F (36.8 C)] 97.6 F (36.4 C) (11/21 0500) Pulse Rate:  [77-94] 77 (11/21 0500) Resp:  [16-18] 16 (11/21 0500) BP: (137-153)/(64-75) 137/70 mmHg (11/21 0500) SpO2:  [95 %-98 %] 98 % (11/21 0500) Weight:  [78.7 kg (173 lb 8 oz)]  78.7 kg (173 lb 8 oz) (11/20 2056)  General appearance: alert, cooperative and no distress Lungs: clear to auscultation bilaterally Heart: regular rate and rhythm, S1, S2 normal, no murmur, click, rub or gallop Extremities: edema bilateral Pulses: 2+ and symmetric Skin: Skin color, texture, turgor normal. No rashes or lesions Neurologic: Alert and oriented X 3, normal strength and tone. Normal symmetric reflexes. Normal coordination and gait  Musculoskeletal:  ROM 0-90, Ligaments intact,  Imaging Review Plain radiographs demonstrate loosening of components Assessment/Plan: Loose, infected primary total knee replacement  The patient history, physical examination and imaging studies are consistent with loosening and infection knee. The patient has failed conservative  treatment.  The clearance notes were reviewed.  After discussion with the patient it was felt that Total Knee Revision and I&D was indicated. The procedure,  risks, and benefits of total knee arthroplasty were presented and reviewed. The risks including but not limited to aseptic loosening, infection, blood clots, vascular injury, stiffness, patella tracking problems complications among others were discussed. The patient acknowledged the explanation, agreed to proceed with the plan.  Bilateral DVT, Patient was found to be having swelling of both legs Vascular Doppler was positive for bilateral DVT,Patient will be started on heparin per medicine. PA from [facility] Anthony Osborn on call) was informed about the anticoagulation. Anthony Osborn promptly alerted Anthony Osborn and staff 16:00 04/27/15 Prolonged anticoagulation after the procedure.   Anthony Osborn 04/28/2015, 6:30 AM

## 2015-04-28 NOTE — Progress Notes (Signed)
This is a late entry.  Patient transferred to OR at 73 via transport. This RN was unable to complete a head to toe assessment on patient prior to surgery. Patient appeared A&OX4, preop checklist completed, heparin drip stopped per orders. Family at bedside.  Joellen Jersey, RN.

## 2015-04-28 NOTE — OR Nursing (Signed)
Order for telemetry clarified. Transfer order Level of care ordered was telemetry and in free text ICU for 24 hours.  Linton Rump contacted for clarification and he stated that pt did not need to go to ICU if he has been stable while in recovery.

## 2015-04-28 NOTE — Progress Notes (Signed)
ANTICOAGULATION CONSULT NOTE - Initial Consult  Pharmacy Consult for heparin Indication: DVT  No Known Allergies  Patient Measurements: Height: 6\' 1"  (185.4 cm) Weight: 173 lb 8 oz (78.7 kg) IBW/kg (Calculated) : 79.9 Heparin Dosing Weight: 80 kg  Vital Signs: Temp: 98.2 F (36.8 C) (11/21 1912) Temp Source: Oral (11/21 1912) BP: 185/100 mmHg (11/21 1912) Pulse Rate: 111 (11/21 1912)  Labs:  Recent Labs  04/26/15 0805  04/27/15 0358 04/27/15 1045 04/28/15 0222 04/28/15 1551  HGB  --   < > 14.2 13.7 13.1  --   HCT  --   --  42.7 41.3 39.3  --   PLT  --   --  340 337 319  --   LABPROT  --   --   --   --  16.8*  --   INR  --   --   --   --  1.35  --   HEPARINUNFRC  --   --   --   --  0.12* <0.10*  CREATININE 0.69  --   --  0.62 0.64  --   < > = values in this interval not displayed.  Estimated Creatinine Clearance: 64.2 mL/min (by C-G formula based on Cr of 0.64).  Assessment: 52 yoM admitted directly from MD office with concern for  Septic R knee in setting of prosthetic appliance in place. Also BL DVT  PMH: HTN, CAD, HLD  AC: None PTA. Patient is s/p surgery for septic knee. Also with BL DVTs  Renal: Scr 0.64  Heme: H&H 13.1/39.3, Plt 319  Goal of Therapy:  Heparin level 0.3-0.7 units/ml Monitor platelets by anticoagulation protocol: Yes   Plan:  Restart heparin at 0800 11/22 at 1400 units/hr (no bolus with increased age and recent surgery) 1600 HL Daily HL, CBC Monitor for s/sx of bleeding  Levester Fresh, PharmD, BCPS Clinical Pharmacist Pager [phone] 04/28/2015 8:22 PM

## 2015-04-28 NOTE — Progress Notes (Signed)
Patient being transferred to 5N after PACU. Attempted to give 5N RN report, but she stated she was fine with just receiving report from PACU.   Joellen Jersey, RN.

## 2015-04-28 NOTE — Progress Notes (Signed)
PT Cancellation Note  Patient Details Name: Anthony Osborn MRN: [id number] DOB: 08-04-1919   Cancelled Treatment:    Reason Eval/Treat Not Completed: Other (comment) (Scheduled for R knee I and D at 11:15am.) PT to return as able s/p surgery.   Kingsley Callander 04/28/2015, 7:19 AM   Kittie Plater, PT, DPT Pager #: [phone] Office #: [phone]

## 2015-04-28 NOTE — Progress Notes (Signed)
Orthopedic Tech Progress Note Patient Details:  Anthony Osborn 13-May-1920 [id number]  CPM Right Knee CPM Right Knee: On Right Knee Flexion (Degrees): 65 Right Knee Extension (Degrees): 0  Ortho Devices Ortho Device/Splint Location: footsie roll Ortho Device/Splint Interventions: Ordered   Braulio Bosch 04/28/2015, 6:18 PM

## 2015-04-28 NOTE — Progress Notes (Signed)
ANTICOAGULATION CONSULT NOTE - Follow Up Consult  Pharmacy Consult for heparin Indication: DVT  Labs:  Recent Labs  04/26/15 0805  04/27/15 0358 04/27/15 1045 04/28/15 0222  HGB  --   < > 14.2 13.7 13.1  HCT  --   --  42.7 41.3 39.3  PLT  --   --  340 337 319  LABPROT  --   --   --   --  16.8*  INR  --   --   --   --  1.35  HEPARINUNFRC  --   --   --   --  0.12*  CREATININE 0.69  --   --  0.62  --   < > = values in this interval not displayed.   Assessment: 81yo male subtherapeutic on heparin with initial dosing for DVT.  Goal of Therapy:  Heparin level 0.3-0.7 units/ml   Plan:  Will bolus with heparin 2000 units x1 and increase gtt by 4 units/kg/hr to 1600 units/hr and check level in [place], PharmD, BCPS  04/28/2015,3:02 AM

## 2015-04-29 ENCOUNTER — Encounter (HOSPITAL_COMMUNITY): Payer: Self-pay | Admitting: Orthopedic Surgery

## 2015-04-29 DIAGNOSIS — I82403 Acute embolism and thrombosis of unspecified deep veins of lower extremity, bilateral: Secondary | ICD-10-CM

## 2015-04-29 DIAGNOSIS — D72829 Elevated white blood cell count, unspecified: Secondary | ICD-10-CM

## 2015-04-29 DIAGNOSIS — Z9889 Other specified postprocedural states: Secondary | ICD-10-CM

## 2015-04-29 LAB — CBC
HEMATOCRIT: 41.9 % (ref 39.0–52.0)
Hemoglobin: 13.6 g/dL (ref 13.0–17.0)
MCH: 31.1 pg (ref 26.0–34.0)
MCHC: 32.5 g/dL (ref 30.0–36.0)
MCV: 95.7 fL (ref 78.0–100.0)
Platelets: 350 10*3/uL (ref 150–400)
RBC: 4.38 MIL/uL (ref 4.22–5.81)
RDW: 14.1 % (ref 11.5–15.5)
WBC: 16.4 10*3/uL — AB (ref 4.0–10.5)

## 2015-04-29 LAB — BASIC METABOLIC PANEL
Anion gap: 8 (ref 5–15)
BUN: 20 mg/dL (ref 6–20)
CHLORIDE: 104 mmol/L (ref 101–111)
CO2: 24 mmol/L (ref 22–32)
Calcium: 8.3 mg/dL — ABNORMAL LOW (ref 8.9–10.3)
Creatinine, Ser: 0.62 mg/dL (ref 0.61–1.24)
GFR calc Af Amer: >60 mL/min (ref >60)
GFR calc non Af Amer: >60 mL/min (ref >60)
Glucose, Bld: 112 mg/dL — ABNORMAL HIGH (ref 65–99)
POTASSIUM: 4.8 mmol/L (ref 3.5–5.1)
SODIUM: 136 mmol/L (ref 135–145)

## 2015-04-29 LAB — PROTIME-INR
INR: 1.35 (ref 0.00–1.49)
Prothrombin Time: 16.8 seconds — ABNORMAL HIGH (ref 11.6–15.2)

## 2015-04-29 MED ORDER — RIVAROXABAN 15 MG PO TABS
15.0000 mg | ORAL_TABLET | Freq: Two times a day (BID) | ORAL | Status: DC
  Administered 2015-04-29 – 2015-04-30 (×2): 15 mg via ORAL
  Filled 2015-04-29 (×4): qty 1

## 2015-04-29 MED ORDER — RIVAROXABAN 20 MG PO TABS: 20.0000 mg | ORAL_TABLET | Freq: Every day | ORAL | Status: DC

## 2015-04-29 NOTE — Progress Notes (Addendum)
[facility]    Date of Admission:  04/21/2015   Total days of antibiotics 8        Day 7 ceftaz           ID: Anthony Osborn is a 81 y.o. 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 Complicated by bilateral DVT Principal Problem:   Septic arthritis of knee, right ([facility]) Active Problems:   Hyperlipidemia   Prostate cancer ([facility])   CAD (coronary artery disease)   Cough   Hyperthyroidism   Pyogenic arthritis of right knee joint ([place])   Essential hypertension   Encounter for palliative care   DVT (deep venous thrombosis) ([facility])    Subjective: Afebrile, he states that he is pretty unsteady in shifting from bed to chair. Still painful on left knee.  Interval hx: went to OR yesterday for 1 stage revision, explant of old hardware and I x D, with new implant  Medications:  . carvedilol  3.125 mg Oral BID  . cefTAZidime (FORTAZ)  IV  2 g Intravenous Q8H  . celecoxib  200 mg Oral Q12H  . docusate sodium  100 mg Oral BID  . methimazole  5 mg Oral Daily  . senna-docusate  1 tablet Oral BID  . simvastatin  10 mg Oral QHS    Objective: Vital signs in last 24 hours: Temp:  [98 F (36.7 C)-98.4 F (36.9 C)] 98 F (36.7 C) (11/22 0548) Pulse Rate:  [76-111] 82 (11/22 0548) Resp:  [11-35] 22 (11/22 0548) BP: (135-185)/(66-100) 147/70 mmHg (11/22 0548) SpO2:  [89 %-100 %] 97 % (11/22 0548)  Physical Exam  Constitutional: He is oriented to person, place, and time. He appears well-developed and well-nourished. No distress.  HENT:  Mouth/Throat: Oropharynx is clear and moist. No oropharyngeal exudate.  Cardiovascular: Normal rate, regular rhythm and normal heart sounds. Exam reveals no gallop and no friction rub.  No murmur heard.  Pulmonary/Chest: Effort normal and breath sounds normal. No respiratory distress. He has no wheezes.  Abdominal: Soft. Bowel sounds are normal. He exhibits no distension. There is no  tenderness.  Skin: Skin is warm and dry. No rash noted. No erythema.  Ext: left knee wrapped from surgery. Trace edema    Lab Results  Recent Labs  04/28/15 0222 04/29/15 0545  WBC 13.3* 16.4*  HGB 13.1 13.6  HCT 39.3 41.9  NA 136 136  K 4.2 4.8  CL 104 104  CO2 25 24  BUN 20 20  CREATININE 0.64 0.62   Liver Panel  Recent Labs  04/27/15 1045 04/28/15 0222  PROT 5.3* 4.9*  ALBUMIN 1.7* 1.7*  AST 62* 55*  ALT 61 54  ALKPHOS 206* 177*  BILITOT 1.0 0.7   Lab Results  Component Value Date   ESRSEDRATE 67* 04/23/2015   Lab Results  Component Value Date   CRP 27.2* 04/23/2015    Microbiology: 11/14 blood cx NGTD 11/14 aspirate cx : PsA Studies/Results: No results found.   Assessment/Plan: PsA prosthetic joint infection = will need 6 wk of IV ceftaz using 11/22 as day 1 of 42. See home health IV abtx orders below, which SNF/rehab can also follow  Leukocytosis = likely post operative inflammation, continue to monitor  dvt = patient transition to xarelto ---------------------- Diagnosis: Prosthetic joint infection of right knee Culture Result: pseudomonas aeruginosa  No Known Allergies  Discharge antibiotics: ceftazadime Per pharmacy protocol  Duration: 6 wk, start  date 04/29/2015 End Date: Jan 3rd, 2017  [facility] Per Protocol: Labs weekly while on IV antibiotics: _x CBC with differential x CMP x CRP x_ ESR __ Vancomycin trough  Fax weekly labs to [phone]  Clinic Follow Up Appt:  in 6 wk at [facility] with Dr. Baxter Flattery or Philomena Course, [facility] Cell: [phone] Pager: [phone]  04/29/2015, 9:52 AM

## 2015-04-29 NOTE — Progress Notes (Signed)
[facility] Progress Note    Patient: Anthony Osborn    [id number]  DOB: 1920-05-10     DOA: 04/21/2015 Date of Service: the patient was seen and examined on 04/29/2015 Day 8 of admission.  Subjective: patient underwent procedure yesterday. No complication noted. Currently denies having any acute pain. No sore throat, chest pain or shortness of breath. Nutrition: underwent surgery yesterday Activity: Continues to remain bedridden postoperatively Last BM: 04/26/2015  Assessment and Plan: 1. Septic arthritis of knee, right ([place]) patient presents with right knee pain and swelling. synovial fluid analysis was positive for infection, culture were positive for Pseudomonas sensitive to [employer]. Infectious diseases consulted and recommended 6 weeks of IV antibiotics after right knee revision procedure planned on 04/28/2015. Patient was evaluated cardiology and recommended proceed with planned procedure if it is felt to be the best treatment option for his septic joint.  Patient underwent right knee single stage procedure for revision arthroplasty and tolerated it very well. Dr. Ronnie Derby continue to follow the patient.  2.Bilateral DVT. Patient was found to be having swelling of both legs Vascular Doppler was positive for bilateral DVT. Was initially on heparin. After discussion with orthopedic the patient will be switched to Xarelto. Most likely provoked DVT. Will need at least 3 months of anticoagulation.  3  CAD (coronary artery disease) Cardiology consulted. Continuing aspirin.  4. Community-acquired pneumonia. Continuing Higher education careers adviser.  5 chronic systolic CHF. Ejection fraction 25-30%. Continue close monitoring. Need to monitor ins and outs and daily weight  6  Hyperthyroidism Continuing home medication.  7  Essential hypertension Blood pressure is stable continue to monitor.  8  Encounter for palliative care Appreciate input from palliative care. We'll continue current  management.  Nutrition: cardiac diet advance as tolerated.  Advance goals of care discussion: Full code  Brief Summary of Hospitalization:  HPI: From the H&P ''Anthony Osborn is a 81 y.o. male with PMH of hypertension, hyperlipidemia, remote prostate cancer, hypothyroidism, CAD, s/p of CABG, who presents with right knee swelling and pain.  The patient reports that he had a minor injury to his R knee at one year. In the past 4 days, he has been having swelling and pain over right knee joint. He also has mild fever, chills and sweating. He was seen by orthopedic surgeon, Dr. Leona Carry, who did aspiration and removed 75 mL of synovial fluid. Initial analysis showed no crystals, but with WBC 69330. Patient reports that he has mild cough with the yellow colored sputum production, no chest pain or shortness of breath. No abdominal pain, diarrhea, symptoms of UTI, unilateral weakness.  In ED, patient was found to have WBC 13.6, temperature 99.9, heart rate 95, electrolytes okay, renal functioning okay, lactate 1.7. Patient admitted to inpatient for further evaluation and treatment. Orthopedic surgeon, Dr. Percell Miller was consulted by ED.  CT of the right knee showed significant lucency about the tibial plateau, underlying the prostheses. This may reflect some degree of chronic loosening about the plate. Lucency underlying the femoral component of the prosthesis, and minimally underlying the patellofemoral component, also concerning for loosening. No definite acute fracture seen. moderate complex knee joint effusion, with mild loculation. This is markedly thick walled. The appearance suggests chronic synovial inflammation, though underlying infection cannot be excluded. Diffuse soft tissue edema tracking along the right knee and lower leg.Diffuse vascular calcifications seen. Varices noted along the right lower leg." Consultants: Cardiology, orthopedics, infectious disease Procedures: Vascular Doppler,  arthrocentesis Antibiotics: Anti-infectives    Start  Dose/Rate Route Frequency Ordered Stop   04/28/15 1030  [MAR Hold]  ceFAZolin (ANCEF) IVPB 2 g/50 mL premix     (MAR Hold since 04/28/15 0929)   2 g 100 mL/hr over 30 Minutes Intravenous  Once 04/27/15 1204 04/28/15 1208   04/25/15 0600  ceFAZolin (ANCEF) IVPB 2 g/50 mL premix  Status:  Discontinued     2 g 100 mL/hr over 30 Minutes Intravenous To ShortStay Surgical 04/24/15 2003 04/25/15 0931   04/23/15 1200  cefTAZidime (FORTAZ) 2 g in dextrose 5 % 50 mL IVPB     2 g 100 mL/hr over 30 Minutes Intravenous Every 8 hours 04/23/15 1122     04/22/15 2200  vancomycin (VANCOCIN) 1,250 mg in sodium chloride 0.9 % 250 mL IVPB  Status:  Discontinued     1,250 mg 166.7 mL/hr over 90 Minutes Intravenous Every 24 hours 04/21/15 2246 04/22/15 1419   04/21/15 2245  vancomycin (VANCOCIN) 1,750 mg in sodium chloride 0.9 % 500 mL IVPB     1,750 mg 250 mL/hr over 120 Minutes Intravenous  Once 04/21/15 2237 04/22/15 0309   04/21/15 2245  cefTRIAXone (ROCEPHIN) 2 g in dextrose 5 % 50 mL IVPB  Status:  Discontinued     2 g 100 mL/hr over 30 Minutes Intravenous Every 24 hours 04/21/15 2238 04/23/15 1122   04/21/15 2045  ceFAZolin (ANCEF) IVPB 1 g/50 mL premix     1 g 100 mL/hr over 30 Minutes Intravenous  Once 04/21/15 2038 04/21/15 2132     Family Communication: family was present at bedside, opportunity was given to ask question and all questions were answered satisfactorily at the time of interview.  Disposition:  Expected discharge date: 05/01/2015 Barriers to safe discharge: postoperative recovery   Intake/Output Summary (Last 24 hours) at 04/29/15 1743 Last data filed at 04/29/15 0655  Gross per 24 hour  Intake  712.5 ml  Output    300 ml  Net  412.5 ml   Filed Weights   04/22/15 2112 04/23/15 2029 04/27/15 2056  Weight: 78.2 kg (172 lb 6.4 oz) 78.6 kg (173 lb 4.5 oz) 78.7 kg (173 lb 8 oz)    Objective: Physical Exam: Filed  Vitals:   04/29/15 0548 04/29/15 1017 04/29/15 1245 04/29/15 1312  BP: 147/70 123/59    Pulse: 82 70  82  Temp: 98 F (36.7 C)     TempSrc: Oral     Resp: 22     Height:      Weight:      SpO2: 97%  92%     General: Appear in mild distress, no Rash; Oral Mucosa moist. Cardiovascular: S1 and S2 Present, no Murmur, no JVD Respiratory: Bilateral Air entry present and Clear to Auscultation, no Crackles, no wheezes Abdomen: Bowel Sound present, Soft and no tenderness Extremities: right lower extremity wrapped  Data Reviewed: CBC:  Recent Labs Lab 04/23/15 0526 04/27/15 0358 04/27/15 1045 04/28/15 0222 04/29/15 0545  WBC 11.9* 14.2* 13.1* 13.3* 16.4*  NEUTROABS  --   --   --  8.8*  --   HGB 13.1 14.2 13.7 13.1 13.6  HCT 39.4 42.7 41.3 39.3 41.9  MCV 93.4 93.4 93.7 92.9 95.7  PLT 198 340 337 319 [id number]   Basic Metabolic Panel:  Recent Labs Lab 04/23/15 0526 04/26/15 0805 04/27/15 1045 04/28/15 0222 04/29/15 0545  NA 134* 135 133* 136 136  K 3.7 3.7 3.5 4.2 4.8  CL 101 101 102 104 104  CO2 26 28  24 25 24   GLUCOSE 113* 103* 162* 99 112*  BUN 26* 19 21* 20 20  CREATININE 0.73 0.69 0.62 0.64 0.62  CALCIUM 8.3* 8.3* 8.2* 8.2* 8.3*   Liver Function Tests:  Recent Labs Lab 04/23/15 0526 04/26/15 0805 04/27/15 1045 04/28/15 0222  AST 66* 74* 62* 55*  ALT 48 71* 61 54  ALKPHOS 123 211* 206* 177*  BILITOT 1.1 1.1 1.0 0.7  PROT 5.0* 5.2* 5.3* 4.9*  ALBUMIN 1.9* 1.6* 1.7* 1.7*   No results for input(s): LIPASE, AMYLASE in the last 168 hours. No results for input(s): AMMONIA in the last 168 hours.  Cardiac Enzymes: No results for input(s): CKTOTAL, CKMB, CKMBINDEX, TROPONINI in the last 168 hours. BNP (last 3 results)  Recent Labs  04/22/15 0115  BNP 621.8*    ProBNP (last 3 results) No results for input(s): PROBNP in the last 8760 hours.   CBG:  Recent Labs Lab 04/24/15 1320 04/25/15 0616 04/26/15 0758 04/27/15 0815  GLUCAP 136* 117* 100* 118*     Recent Results (from the past 240 hour(s))  Culture, body fluid-bottle     Status: None   Collection Time: 04/21/15  8:02 PM  Result Value Ref Range Status   Specimen Description FLUID RIGHT KNEE  Final   Special Requests NONE  Final   Gram Stain   Final    GRAM NEGATIVE RODS CRITICAL RESULT CALLED TO, READ BACK BY AND VERIFIED WITH: IAnnamaria Helling RN 10:45 04/22/15 ([email or web address])    Culture PSEUDOMONAS AERUGINOSA  Final   Report Status 04/24/2015 FINAL  Final   Organism ID, Bacteria PSEUDOMONAS AERUGINOSA  Final      Susceptibility   Pseudomonas aeruginosa - MIC*    CEFTAZIDIME <=1 SENSITIVE Sensitive     CIPROFLOXACIN <=0.25 SENSITIVE Sensitive     GENTAMICIN <=1 SENSITIVE Sensitive     IMIPENEM 1 SENSITIVE Sensitive     PIP/TAZO <=4 SENSITIVE Sensitive     CEFEPIME <=1 SENSITIVE Sensitive     * PSEUDOMONAS AERUGINOSA  Gram stain     Status: None   Collection Time: 04/21/15  8:02 PM  Result Value Ref Range Status   Specimen Description FLUID RIGHT KNEE  Final   Special Requests NONE  Final   Gram Stain   Final    ABUNDANT WBC PRESENT, PREDOMINANTLY PMN NO ORGANISMS SEEN    Report Status 04/21/2015 FINAL  Final  Culture, blood (routine x 2)     Status: None   Collection Time: 04/21/15 10:50 PM  Result Value Ref Range Status   Specimen Description BLOOD LEFT ARM  Final   Special Requests BOTTLES DRAWN AEROBIC AND ANAEROBIC 5CC  Final   Culture NO GROWTH 5 DAYS  Final   Report Status 04/26/2015 FINAL  Final  Culture, blood (routine x 2)     Status: None   Collection Time: 04/21/15 11:00 PM  Result Value Ref Range Status   Specimen Description BLOOD LEFT FOREARM  Final   Special Requests BOTTLES DRAWN AEROBIC AND ANAEROBIC 10CC  Final   Culture NO GROWTH 5 DAYS  Final   Report Status 04/26/2015 FINAL  Final  Surgical PCR screen     Status: None   Collection Time: 04/28/15  9:17 AM  Result Value Ref Range Status   MRSA, PCR NEGATIVE NEGATIVE Final   Staphylococcus aureus  NEGATIVE NEGATIVE Final    Comment:        The Xpert SA Assay (FDA approved for NASAL specimens in patients over 21  years of age), is one component of a comprehensive surveillance program.  Test performance has been validated by [facility] for patients greater than or equal to 47 year old. It is not intended to diagnose infection nor to guide or monitor treatment.      Studies: No results found.   Scheduled Meds: . carvedilol  3.125 mg Oral BID  . cefTAZidime (FORTAZ)  IV  2 g Intravenous Q8H  . celecoxib  200 mg Oral Q12H  . docusate sodium  100 mg Oral BID  . methimazole  5 mg Oral Daily  . rivaroxaban  15 mg Oral BID WC   Followed by  . [START ON 05/21/2015] rivaroxaban  20 mg Oral Q supper  . senna-docusate  1 tablet Oral BID  . simvastatin  10 mg Oral QHS   Continuous Infusions: . sodium chloride 75 mL/hr at 04/28/15 2150    Time spent: 30 minutes  Author: Berle Mull, MD [facility] Pager: [phone] 04/29/2015 5:43 PM  If 7PM-7AM, please contact night-coverage at [email or web address], password [facility]

## 2015-04-29 NOTE — Progress Notes (Signed)
OT Cancellation Note  Patient Details Name: Simran Vierling MRN: [id number] DOB: Jun 03, 1920   Cancelled Treatment:    Reason Eval/Treat Not Completed: Medical issues which prohibited therapy;Other (comment) (BLE DVTs and started heparin this morning. ) Will assess when medically stable. MD please indicate when patient able to mobilize.   Hortencia Pilar 04/29/2015, 9:18 AM

## 2015-04-29 NOTE — Clinical Social Work Placement (Signed)
   CLINICAL SOCIAL WORK PLACEMENT  NOTE  Date:  04/29/2015  Patient Details  Name: Anthony Osborn MRN: [id number] Date of Birth: 04/01/1920  Clinical Social Work is seeking post-discharge placement for this patient at the [place] level of care (*CSW will initial, date and re-position this form in  chart as items are completed):  Yes   Patient/family provided with [place] Work Department's list of facilities offering this level of care within the geographic area requested by the patient (or if unable, by the patient's family).  Yes   Patient/family informed of their freedom to choose among providers that offer the needed level of care, that participate in Medicare, Medicaid or managed care program needed by the patient, have an available bed and are willing to accept the patient.  Yes   Patient/family informed of [facility]'s ownership interest in [facility] and [facility], as well as of the fact that they are under no obligation to receive care at these facilities.  PASRR submitted to EDS on 04/29/15     PASRR number received on 04/29/15     Existing PASRR number confirmed on       FL2 transmitted to all facilities in geographic area requested by pt/family on 04/29/15     FL2 transmitted to all facilities within larger geographic area on 04/29/15     Patient informed that his/her managed care company has contracts with or will negotiate with certain facilities, including the following:        Yes   Patient/family informed of bed offers received.  Patient chooses bed at       Physician recommends and patient chooses bed at      Patient to be transferred to   on  .  Patient to be transferred to facility by       Patient family notified on   of transfer.  Name of family member notified:        PHYSICIAN Please sign FL2     Additional Comment:    _______________________________________________ Ross Ludwig,  LCSWA 04/29/2015, 1:56 PM

## 2015-04-29 NOTE — Progress Notes (Signed)
Physical Therapy Treatment/ RE-evaluation Patient Details Name: Anthony Osborn MRN: [id number] DOB: [birth year] Today's Date: 04/29/2015    History of Present Illness 81 yo male admitted with R knee swelling and pain. pt with cough and mild fever. PMH: HTN Hyperlipidemia, remote CA prostate, Hypothryoidism, CAD s/p CABG. Pt s/p R TKA revision and I& D 11/21    PT Comments    Pt now s/p R TKA revision with improved function and mobility as well as tolerance for limited gait post-op. Pt continues to need heavy assist for sit to stand transfers, max cues and assist for all mobility with plan of SNF still appropriate. Pt will continue to benefit from acute therapy to maximize strength, function and independence to decrease burden of care. Pt and family educated for heel foam use, CPM use, plan and progression.     Follow Up Recommendations  SNF;Supervision/Assistance - 24 hour     Equipment Recommendations  Rolling walker with 5" wheels    Recommendations for Other Services       Precautions / Restrictions Precautions Precautions: Fall;Knee Restrictions Weight Bearing Restrictions: No RLE Weight Bearing: Weight bearing as tolerated    Mobility  Bed Mobility Overal bed mobility: Needs Assistance Bed Mobility: Supine to Sit     Supine to sit: Min assist;HOB elevated     General bed mobility comments: HOB 25degrees with assist to move RLE toward EOb, use of rail and cues for trunk elevation as well as scooting forward  Transfers Overall transfer level: Needs assistance   Transfers: Sit to/from Stand Sit to Stand: Mod assist;From elevated surface;+2 physical assistance         General transfer comment: max multimodal cues for hand placement, sequence, assist for anterior translation and elevation from surface with LLE blocked. Min assist to sit with controlled assist  Ambulation/Gait Ambulation/Gait assistance: Min assist;+2 safety/equipment Ambulation Distance (Feet): 6  Feet Assistive device: Rolling walker (2 wheeled) Gait Pattern/deviations: Step-to pattern;Trunk flexed;Decreased stance time - right   Gait velocity interpretation: Below normal speed for age/gender General Gait Details: max cues for sequence, posture, position in RW, fatigues quickly with chair to follow   Stairs            Wheelchair Mobility    Modified Rankin (Stroke Patients Only)       Balance Overall balance assessment: Needs assistance   Sitting balance-Leahy Scale: Good       Standing balance-Leahy Scale: Poor                      Cognition Arousal/Alertness: Awake/alert Behavior During Therapy: WFL for tasks assessed/performed Overall Cognitive Status: Impaired/Different from baseline Area of Impairment: Following commands     Memory: Decreased short-term memory;Decreased recall of precautions Following Commands: Follows one step commands with increased time            Exercises Total Joint Exercises Quad Sets: AROM;Right;10 reps;Supine Heel Slides: AROM;Right;10 reps;Supine Long Arc Quad: AROM;Seated;Right;10 reps Marching in Standing: AROM;Seated;Right;10 reps    General Comments        Pertinent Vitals/Pain Pain Assessment: 0-10 Pain Score: 8  Pain Location: right knee Pain Descriptors / Indicators: Aching;Guarding Pain Intervention(s): Limited activity within patient's tolerance;Monitored during session;Premedicated before session;Repositioned;Ice applied    Home Living                      Prior Function            PT Goals (current goals can now  be found in the care plan section) Acute Rehab PT Goals Patient Stated Goal: walk PT Goal Formulation: With patient/family Time For Goal Achievement: 05/06/15 Potential to Achieve Goals: Fair Progress towards PT goals: Progressing toward goals (goals remain appropriate post operatively)    Frequency  Min 5X/week    PT Plan Frequency needs to be updated;Current  plan remains appropriate    Co-evaluation             End of Session Equipment Utilized During Treatment: Gait belt Activity Tolerance: Patient tolerated treatment well Patient left: in chair;with call bell/phone within reach;with chair alarm set;with family/visitor present;with nursing/sitter in room     Time: 1234-1310 PT Time Calculation (min) (ACUTE ONLY): 36 min  Charges:  $Therapeutic Activity: 8-22 mins                    G Codes:      Melford Aase 2015/05/17, 1:19 PM Elwyn Reach, [place]

## 2015-04-29 NOTE — Progress Notes (Addendum)
SPORTS MEDICINE AND JOINT REPLACEMENT  Lara Mulch, MD   Carlynn Spry, PA-C [address], [place], [place]  [address]                             [phone]   PROGRESS NOTE  Subjective:  negative for Chest Pain  negative for Shortness of Breath  negative for Nausea/Vomiting   negative for Calf Pain  negative for Bowel Movement   Tolerating Diet: yes         Patient reports pain as 5 on 0-10 scale.    Objective: Vital signs in last 24 hours:   Patient Vitals for the past 24 hrs:  BP Temp Temp src Pulse Resp SpO2  04/29/15 1017 (!) 123/59 mmHg - - 70 - -  04/29/15 0548 (!) 147/70 mmHg 98 F (36.7 C) Oral 82 (!) 22 97 %  04/29/15 0012 (!) 145/68 mmHg 98.4 F (36.9 C) Oral 93 (!) 22 100 %  04/28/15 1912 (!) 185/100 mmHg 98.2 F (36.8 C) Oral (!) 111 20 99 %  04/28/15 1845 (!) 169/88 mmHg - - 94 (!) 21 96 %  04/28/15 1830 - - - 91 17 95 %  04/28/15 1815 (!) 166/94 mmHg - - (!) 101 (!) 35 96 %  04/28/15 1802 - - - 87 14 98 %  04/28/15 1800 - - - 85 16 97 %  04/28/15 1754 (!) 161/88 mmHg - - - - -  04/28/15 1745 - - - 84 13 98 %  04/28/15 1739 (!) 161/86 mmHg - - - - -  04/28/15 1730 - - - 83 14 97 %  04/28/15 1724 (!) 158/72 mmHg - - - - -  04/28/15 1715 - - - 84 17 98 %  04/28/15 1709 (!) 166/66 mmHg - - - - -  04/28/15 1700 - - - 82 13 97 %  04/28/15 1653 (!) 159/89 mmHg - - - - -  04/28/15 1645 - - - 89 17 97 %  04/28/15 1638 (!) 143/78 mmHg - - 80 11 98 %  04/28/15 1630 - - - 81 14 96 %  04/28/15 1623 (!) 142/79 mmHg - - 79 13 96 %  04/28/15 1615 - - - 78 14 95 %  04/28/15 1608 140/82 mmHg - - 83 16 97 %  04/28/15 1600 - - - 78 16 94 %  04/28/15 1545 - - - 78 (!) 22 96 %  04/28/15 1530 - - - 76 15 94 %  04/28/15 1515 135/74 mmHg - - 80 18 94 %  04/28/15 1500 - - - 82 18 95 %  04/28/15 1458 - - - 82 20 93 %  04/28/15 1450 139/77 mmHg 98.2 F (36.8 C) - 82 (!) 22 (!) 89 %    @flow {1959:LAST@   Intake/Output from previous day:   11/21 0701 - 11/22  0700 In: 1915.3 [I.V.:1815.3] Out: 1000 [Urine:950]   Intake/Output this shift:       Intake/Output      11/21 0701 - 11/22 0700 11/22 0701 - 11/23 0700   P.O. 0    I.V. (mL/kg) 1815.3 (23.1)    IV Piggyback 100    Total Intake(mL/kg) 1915.3 (24.3)    Urine (mL/kg/hr) 950 (0.5)    Drains 0 (0)    Stool 0 (0)    Blood 50 (0)    Total Output 1000     Net +915.3  Urine Occurrence 0 x    Stool Occurrence 0 x       LABORATORY DATA:  Recent Labs  04/23/15 0526 04/27/15 0358 04/27/15 1045 04/28/15 0222 04/29/15 0545  WBC 11.9* 14.2* 13.1* 13.3* 16.4*  HGB 13.1 14.2 13.7 13.1 13.6  HCT 39.4 42.7 41.3 39.3 41.9  PLT 198 340 337 319 350    Recent Labs  04/23/15 0526 04/26/15 0805 04/27/15 1045 04/28/15 0222 04/29/15 0545  NA 134* 135 133* 136 136  K 3.7 3.7 3.5 4.2 4.8  CL 101 101 102 104 104  CO2 26 28 24 25 24   BUN 26* 19 21* 20 20  CREATININE 0.73 0.69 0.62 0.64 0.62  GLUCOSE 113* 103* 162* 99 112*  CALCIUM 8.3* 8.3* 8.2* 8.2* 8.3*   Lab Results  Component Value Date   INR 1.35 04/29/2015   INR 1.35 04/28/2015   INR 1.41 04/21/2015    Examination:  General appearance: alert, cooperative and no distress Extremities: Homans sign is negative, no sign of DVT  Wound Exam: clean, dry, intact   Drainage:  Scant/small amount Serosanguinous exudate  Motor Exam: Quadriceps and Hamstrings Intact  Sensory Exam: Deep Peroneal normal   Assessment:    1 Day Post-Op  Procedure(s) (LRB): INCISION AND DRAINAGE (Right) TOTAL KNEE REVISION (Right)  ADDITIONAL DIAGNOSIS:  Principal Problem:   Septic arthritis of knee, right (HCC) Active Problems:   Hyperlipidemia   Prostate cancer (HCC)   CAD (coronary artery disease)   Cough   Hyperthyroidism   Pyogenic arthritis of right knee joint (HCC)   Essential hypertension   Encounter for palliative care   DVT (deep venous thrombosis) (HCC)  Acute Blood Loss Anemia   Plan: Physical Therapy as  ordered Weight Bearing as Tolerated (WBAT)  SNF for postoperative care [facility] preferred  Right knee infection - antibiotics per Infectious Disease  Bilateral DVT preoperatively - anticoagulation per medicine  Okay to discharge to SNF from ortho standpoint, WBAT, follow up in 2 weeks, dressing changed, should be changed every other day, okay to get wet with soap and water, DO NOT submerge in water.             Anthony Osborn 04/29/2015, 11:24 AM

## 2015-04-29 NOTE — Progress Notes (Signed)
[facility] for xarelto Indication: DVT  No Known Allergies  Patient Measurements: Height: 6\' 1"  (185.4 cm) Weight: 173 lb 8 oz (78.7 kg) IBW/kg (Calculated) : 79.9 Heparin Dosing Weight: 80 kg  Vital Signs: Temp: 98 F (36.7 C) (11/22 0548) Temp Source: Oral (11/22 0548) BP: 123/59 mmHg (11/22 1017) Pulse Rate: 82 (11/22 1312)  Labs:  Recent Labs  04/27/15 1045 04/28/15 0222 04/28/15 1551 04/29/15 0545  HGB 13.7 13.1  --  13.6  HCT 41.3 39.3  --  41.9  PLT 337 319  --  350  LABPROT  --  16.8*  --  16.8*  INR  --  1.35  --  1.35  HEPARINUNFRC  --  0.12* <0.10*  --   CREATININE 0.62 0.64  --  0.62    Estimated Creatinine Clearance: 64.2 mL/min (by C-G formula based on Cr of 0.62).  Assessment: 68 yoM admitted directly from MD office with concern for  Septic R knee in setting of prosthetic appliance in place. Also BL DVT. Initially started on heparin but now transitioning to xarelto. CBC is WNL and pt has good renal function with an estimated CrCl>29ml/min.   Goal of Therapy:  Therapeutic anticoagulation Monitor platelets by anticoagulation protocol: Yes   Plan:  - DC heparin when xarelto starts tonight - Xarelto 15mg  PO BID x 21 days then xarelto 20mg  daily - F/u renal function, S&S of bleeding - Will educate prior to discharge  Salome Arnt, PharmD, BCPS Pager # [phone] 04/29/2015 2:19 PM

## 2015-04-29 NOTE — NC FL2 (Signed)
[place] MEDICAID FL2 LEVEL OF CARE SCREENING TOOL     IDENTIFICATION  Patient Name: Anthony Osborn Birthdate: June 14, 1919 Sex: male Admission Date (Current Location): 04/21/2015  [facility] and [place] Number: [employer] and Address:  [facility] [place], [place] [address], [place], [place] [address]      Provider Number: [id number]  Attending Physician Name and Address:  Lavina Hamman, MD  Relative Name and Phone Number:       Current Level of Care: Hospital Recommended Level of Care: [place] Prior Approval Number:    Date Approved/Denied:   PASRR Number:   [id number]  Discharge Plan: SNF    Current Diagnoses: Patient Active Problem List   Diagnosis Date Noted  . DVT (deep venous thrombosis) ([place]) 04/27/2015  . Pyogenic arthritis of right knee joint ([place])   . Essential hypertension   . Encounter for palliative care   . CAD (coronary artery disease) 04/21/2015  . Septic arthritis of knee, right ([place]) 04/21/2015  . Cough 04/21/2015  . Hyperlipidemia   . Prostate cancer ([place])   . Hyperthyroidism     Orientation ACTIVITIES/SOCIAL BLADDER RESPIRATION    Self, Time, Situation  Family supportive Incontinent, External catheter O2 (As needed)  BEHAVIORAL SYMPTOMS/MOOD NEUROLOGICAL BOWEL NUTRITION STATUS   (NONE)  (NONE) Continent Diet (Regular)  PHYSICIAN VISITS COMMUNICATION OF NEEDS Height & Weight Skin    Verbally 6\' 1"  (185.4 cm) 173 lbs. Surgical wounds          AMBULATORY STATUS RESPIRATION    Assist extensive O2 (As needed)      Personal Care Assistance Level of Assistance  Dressing, Bathing Bathing Assistance: Maximum assistance   Dressing Assistance: Maximum assistance      Functional Limitations Info   (NONE)             [place]  PT (By licensed PT), OT (By licensed OT)     PT Frequency: 3 OT Frequency: 2           Additional Factors Info  Code Status, Allergies Code  Status Info: FULL CODE  Allergies Info: N/A           Current Medications (04/29/2015): Current Facility-Administered Medications  Medication Dose Route Frequency Provider Last Rate Last Dose  . 0.9 %  sodium chloride infusion   Intravenous Continuous Carlynn Spry, PA-C 75 mL/hr at 04/28/15 2150    . acetaminophen (TYLENOL) tablet 650 mg  650 mg Oral Q6H PRN Carlynn Spry, PA-C       Or  . acetaminophen (TYLENOL) suppository 650 mg  650 mg Rectal Q6H PRN Carlynn Spry, PA-C      . albuterol (PROVENTIL) (2.5 MG/3ML) 0.083% nebulizer solution 2.5 mg  2.5 mg Nebulization Q4H PRN Lavina Hamman, MD      . alum & mag hydroxide-simeth (MAALOX/MYLANTA) 200-200-20 MG/5ML suspension 30 mL  30 mL Oral Q4H PRN Carlynn Spry, PA-C      . bisacodyl (DULCOLAX) EC tablet 5 mg  5 mg Oral Daily PRN Carlynn Spry, PA-C      . carvedilol (COREG) tablet 3.125 mg  3.125 mg Oral BID Ivor Costa, MD   3.125 mg at 04/29/15 1014  . cefTAZidime (FORTAZ) 2 g in dextrose 5 % 50 mL IVPB  2 g Intravenous Q8H Reyne Dumas, MD   2 g at 04/29/15 0443  . celecoxib (CELEBREX) capsule 200 mg  200 mg Oral Q12H Carlynn Spry, PA-C   200 mg  at 04/29/15 1014  . diphenhydrAMINE (BENADRYL) 12.5 MG/5ML elixir 12.5-25 mg  12.5-25 mg Oral Q4H PRN Carlynn Spry, PA-C      . docusate sodium (COLACE) capsule 100 mg  100 mg Oral BID Carlynn Spry, PA-C   100 mg at 04/29/15 1015  . fentaNYL (SUBLIMAZE) injection 25-50 mcg  25-50 mcg Intravenous Q5 min PRN Roderic Palau, MD      . heparin ADULT infusion 100 units/mL (25000 units/250 mL)  1,400 Units/hr Intravenous Continuous Wynell Balloon, RPH 14 mL/hr at 04/29/15 0852 1,400 Units/hr at 04/29/15 0852  . HYDROcodone-acetaminophen (NORCO) 7.5-325 MG per tablet 1 tablet  1 tablet Oral Q6H PRN Lavina Hamman, MD   1 tablet at 04/29/15 1014  . menthol-cetylpyridinium (CEPACOL) lozenge 3 mg  1 lozenge Oral PRN Carlynn Spry, PA-C       Or  . phenol (CHLORASEPTIC) mouth spray 1 spray  1  spray Mouth/Throat PRN Carlynn Spry, PA-C      . methimazole (TAPAZOLE) tablet 5 mg  5 mg Oral Daily Ivor Costa, MD   5 mg at 04/29/15 1015  . methocarbamol (ROBAXIN) tablet 500 mg  500 mg Oral Q6H PRN Carlynn Spry, PA-C       Or  . methocarbamol (ROBAXIN) 500 mg in dextrose 5 % 50 mL IVPB  500 mg Intravenous Q6H PRN Carlynn Spry, PA-C      . metoCLOPramide (REGLAN) tablet 5-10 mg  5-10 mg Oral Q8H PRN Carlynn Spry, PA-C       Or  . metoCLOPramide (REGLAN) injection 5-10 mg  5-10 mg Intravenous Q8H PRN Carlynn Spry, PA-C      . ondansetron [facility]) tablet 4 mg  4 mg Oral Q6H PRN Carlynn Spry, PA-C       Or  . ondansetron [facility]) injection 4 mg  4 mg Intravenous Q6H PRN Carlynn Spry, PA-C      . senna-docusate (Senokot-S) tablet 1 tablet  1 tablet Oral BID Reyne Dumas, MD   1 tablet at 04/29/15 1015  . senna-docusate (Senokot-S) tablet 1 tablet  1 tablet Oral QHS PRN Carlynn Spry, PA-C   1 tablet at 04/28/15 2135  . simvastatin (ZOCOR) tablet 10 mg  10 mg Oral QHS Ivor Costa, MD   10 mg at 04/28/15 2136  . sodium phosphate (FLEET) 7-19 GM/118ML enema 1 enema  1 enema Rectal Once PRN Carlynn Spry, PA-C      . zolpidem (AMBIEN) tablet 5 mg  5 mg Oral QHS PRN Reyne Dumas, MD   5 mg at 04/24/15 2204   Do not use this list as official medication orders. Please verify with discharge summary.  Discharge Medications:   Medication List    ASK your doctor about these medications        acetaminophen 500 MG tablet  Commonly known as:  TYLENOL  Take 500 mg by mouth every 6 (six) hours as needed for moderate pain.     aspirin 81 MG tablet  Take 81 mg by mouth daily.     CALCIUM-MAGNESIUM-ZINC PO  Take 1 tablet by mouth 3 (three) times daily.     COREG 12.5 MG tablet  Generic drug:  carvedilol  Take 12.5 mg by mouth 2 (two) times daily.     carvedilol 3.125 MG tablet  Commonly known as:  COREG  Take 3.125 mg by mouth 2 (two) times daily.     lisinopril-hydrochlorothiazide  10-12.5 MG tablet  Commonly known as:  PRINZIDE,ZESTORETIC  Take 1 tablet by  mouth daily.     methimazole 10 MG tablet  Commonly known as:  TAPAZOLE  Take 5 mg by mouth daily.     VITAMIN D-3 PO  Take 1,000 Units by mouth daily.     ZOCOR 10 MG tablet  Generic drug:  simvastatin  Take 10 mg by mouth at bedtime.        Relevant Imaging Results:  Relevant Lab Results:  Recent Labs    Additional Information SSN [id number]  Glendon Axe, MSW, LCSWA [phone] 04/29/2015 10:49 AM

## 2015-04-29 NOTE — Progress Notes (Signed)
Orthopedic Tech Progress Note Patient Details:  Anthony Osborn 03-Sep-1919 [id number] On cpm at 1900 Patient ID: Anthony Osborn, male   DOB: 1920-05-16, 81 y.o.   MRN: [id number]   Anthony Osborn 04/29/2015, 6:53 PM

## 2015-04-29 NOTE — Clinical Social Work Note (Addendum)
CSW met with patient, his wife, and his daughter to present bed offers.  Patient's family did not want the bed offers that were provided and asked for patient to be faxed out to [facility].  CSW faxed out patient to [facility], awaiting other bed offers.  4:00pm CSW spoke with patient's family and they have agreed to go to [facility], CSW contacted SNF admissions who confirmed they can take patient once he is medically ready for discharge and orders have been received.  Jones Broom. [place], MSW, [place] 04/29/2015 1:55 PM

## 2015-04-29 NOTE — Clinical Social Work Note (Signed)
Clinical Social Work Assessment  Patient Details  Name: Anthony Osborn MRN: [id number] Date of Birth: 1920-02-05  Date of referral:  04/29/15               Reason for consult:  Facility Placement                Permission sought to share information with:  Family Supports Permission granted to share information::  Yes, Verbal Permission Granted  Name::     Peggy Vanlue patient's wife  Agency::  SNF admissions  Relationship::     Contact Information:     Housing/Transportation Living arrangements for the past 2 months:  Single Family Home Source of Information:  Patient Patient Interpreter Needed:  None Criminal Activity/Legal Involvement Pertinent to Current Situation/Hospitalization:  No - Comment as needed Significant Relationships:  Spouse Lives with:  Spouse Do you feel safe going back to the place where you live?  Yes (Patient feels he needs some short term rehab before he can return back home.) Need for family participation in patient care:  Yes (Comment) (Patietn requests to involve his wife with SNF placement decisions.)  Care giving concerns:  Patient and family feels he needs some short term rehab before he can return back home.   Social Worker assessment / plan:  Patient is a pleasant 81 year old male who lives with his wife.  Patient is alert and oriented x3 and talkative.  Patient states he has not been to short term rehab in the past, CSW explained to patient what to expect.  CSW explained how insurance pays for his stay and patient agreed to have information faxed out to SNFs in [facility].  Patient expressed that he would like CSW to speak with his wife as well to explain SNF search process.  Patient expressed he did not have any other questions and would prefer to go to [facility] in [place] once he is medically ready for discharge.  CSW explained to patient that if beds are not available at [facility] then he would have to go to other faclities patient expressed that he  understands and he did not have any other questions.  Employment status:  Retired Forensic scientist:  Medicare PT Recommendations:  [place] / Referral to community resources:  [place]  Patient/Family's Response to care:  Patient and family agreeable to SNF placement  Patient/Family's Understanding of and Emotional Response to Diagnosis, Current Treatment, and Prognosis:  Patient and family are aware of current diagnosis and treatment plan.  Emotional Assessment Appearance:  Appears younger than stated age Attitude/Demeanor/Rapport:    Affect (typically observed):  Stable, Pleasant, Appropriate Orientation:  Oriented to Self, Oriented to Place, Oriented to Situation Alcohol / Substance use:  Not Applicable Psych involvement (Current and /or in the community):  No (Comment)  Discharge Needs  Concerns to be addressed:  Lack of Support Readmission within the last 30 days:  No Current discharge risk:  None Barriers to Discharge:  No Barriers Identified   Ross Ludwig, LCSWA 04/29/2015, 11:31 AM

## 2015-04-30 DIAGNOSIS — Z8546 Personal history of malignant neoplasm of prostate: Secondary | ICD-10-CM | POA: Diagnosis not present

## 2015-04-30 DIAGNOSIS — M6281 Muscle weakness (generalized): Secondary | ICD-10-CM | POA: Diagnosis not present

## 2015-04-30 DIAGNOSIS — J189 Pneumonia, unspecified organism: Secondary | ICD-10-CM | POA: Diagnosis not present

## 2015-04-30 DIAGNOSIS — I824Z3 Acute embolism and thrombosis of unspecified deep veins of distal lower extremity, bilateral: Secondary | ICD-10-CM | POA: Diagnosis not present

## 2015-04-30 DIAGNOSIS — T814XXA Infection following a procedure, initial encounter: Secondary | ICD-10-CM | POA: Diagnosis not present

## 2015-04-30 DIAGNOSIS — I2510 Atherosclerotic heart disease of native coronary artery without angina pectoris: Secondary | ICD-10-CM | POA: Diagnosis not present

## 2015-04-30 DIAGNOSIS — R262 Difficulty in walking, not elsewhere classified: Secondary | ICD-10-CM | POA: Diagnosis not present

## 2015-04-30 DIAGNOSIS — Z86718 Personal history of other venous thrombosis and embolism: Secondary | ICD-10-CM | POA: Diagnosis not present

## 2015-04-30 DIAGNOSIS — R338 Other retention of urine: Secondary | ICD-10-CM | POA: Diagnosis not present

## 2015-04-30 DIAGNOSIS — E785 Hyperlipidemia, unspecified: Secondary | ICD-10-CM | POA: Diagnosis not present

## 2015-04-30 DIAGNOSIS — B372 Candidiasis of skin and nail: Secondary | ICD-10-CM | POA: Diagnosis not present

## 2015-04-30 DIAGNOSIS — T17390A Other foreign object in larynx causing asphyxiation, initial encounter: Secondary | ICD-10-CM | POA: Diagnosis not present

## 2015-04-30 DIAGNOSIS — R310 Gross hematuria: Secondary | ICD-10-CM | POA: Diagnosis not present

## 2015-04-30 DIAGNOSIS — I824Y3 Acute embolism and thrombosis of unspecified deep veins of proximal lower extremity, bilateral: Secondary | ICD-10-CM | POA: Diagnosis not present

## 2015-04-30 DIAGNOSIS — Z96651 Presence of right artificial knee joint: Secondary | ICD-10-CM | POA: Diagnosis not present

## 2015-04-30 DIAGNOSIS — M25561 Pain in right knee: Secondary | ICD-10-CM | POA: Diagnosis not present

## 2015-04-30 DIAGNOSIS — M25461 Effusion, right knee: Secondary | ICD-10-CM | POA: Diagnosis not present

## 2015-04-30 DIAGNOSIS — E0590 Thyrotoxicosis, unspecified without thyrotoxic crisis or storm: Secondary | ICD-10-CM | POA: Diagnosis not present

## 2015-04-30 DIAGNOSIS — C61 Malignant neoplasm of prostate: Secondary | ICD-10-CM | POA: Diagnosis not present

## 2015-04-30 DIAGNOSIS — T18128A Food in esophagus causing other injury, initial encounter: Secondary | ICD-10-CM | POA: Diagnosis not present

## 2015-04-30 DIAGNOSIS — R05 Cough: Secondary | ICD-10-CM | POA: Diagnosis not present

## 2015-04-30 DIAGNOSIS — B965 Pseudomonas (aeruginosa) (mallei) (pseudomallei) as the cause of diseases classified elsewhere: Secondary | ICD-10-CM | POA: Diagnosis not present

## 2015-04-30 DIAGNOSIS — T8453XA Infection and inflammatory reaction due to internal right knee prosthesis, initial encounter: Secondary | ICD-10-CM | POA: Diagnosis not present

## 2015-04-30 DIAGNOSIS — I10 Essential (primary) hypertension: Secondary | ICD-10-CM | POA: Diagnosis not present

## 2015-04-30 DIAGNOSIS — M00861 Arthritis due to other bacteria, right knee: Secondary | ICD-10-CM | POA: Diagnosis not present

## 2015-04-30 DIAGNOSIS — M009 Pyogenic arthritis, unspecified: Secondary | ICD-10-CM | POA: Diagnosis not present

## 2015-04-30 DIAGNOSIS — D62 Acute posthemorrhagic anemia: Secondary | ICD-10-CM | POA: Diagnosis not present

## 2015-04-30 DIAGNOSIS — T18108A Unspecified foreign body in esophagus causing other injury, initial encounter: Secondary | ICD-10-CM | POA: Diagnosis not present

## 2015-04-30 DIAGNOSIS — N410 Acute prostatitis: Secondary | ICD-10-CM | POA: Diagnosis not present

## 2015-04-30 DIAGNOSIS — I5022 Chronic systolic (congestive) heart failure: Secondary | ICD-10-CM | POA: Diagnosis not present

## 2015-04-30 DIAGNOSIS — T82524A Displacement of infusion catheter, initial encounter: Secondary | ICD-10-CM | POA: Diagnosis not present

## 2015-04-30 DIAGNOSIS — Z452 Encounter for adjustment and management of vascular access device: Secondary | ICD-10-CM | POA: Diagnosis not present

## 2015-04-30 DIAGNOSIS — R339 Retention of urine, unspecified: Secondary | ICD-10-CM | POA: Diagnosis not present

## 2015-04-30 DIAGNOSIS — M1990 Unspecified osteoarthritis, unspecified site: Secondary | ICD-10-CM | POA: Diagnosis not present

## 2015-04-30 DIAGNOSIS — T8450XS Infection and inflammatory reaction due to unspecified internal joint prosthesis, sequela: Secondary | ICD-10-CM | POA: Diagnosis not present

## 2015-04-30 DIAGNOSIS — I82403 Acute embolism and thrombosis of unspecified deep veins of lower extremity, bilateral: Secondary | ICD-10-CM | POA: Diagnosis not present

## 2015-04-30 LAB — BASIC METABOLIC PANEL
ANION GAP: 6 (ref 5–15)
BUN: 15 mg/dL (ref 6–20)
CALCIUM: 8.4 mg/dL — AB (ref 8.9–10.3)
CO2: 25 mmol/L (ref 22–32)
Chloride: 105 mmol/L (ref 101–111)
Creatinine, Ser: 0.56 mg/dL — ABNORMAL LOW (ref 0.61–1.24)
GLUCOSE: 108 mg/dL — AB (ref 65–99)
Potassium: 4 mmol/L (ref 3.5–5.1)
SODIUM: 136 mmol/L (ref 135–145)

## 2015-04-30 LAB — CBC
HCT: 38.9 % — ABNORMAL LOW (ref 39.0–52.0)
Hemoglobin: 12.7 g/dL — ABNORMAL LOW (ref 13.0–17.0)
MCH: 31 pg (ref 26.0–34.0)
MCHC: 32.6 g/dL (ref 30.0–36.0)
MCV: 94.9 fL (ref 78.0–100.0)
PLATELETS: 327 10*3/uL (ref 150–400)
RBC: 4.1 MIL/uL — AB (ref 4.22–5.81)
RDW: 14.1 % (ref 11.5–15.5)
WBC: 13.1 10*3/uL — AB (ref 4.0–10.5)

## 2015-04-30 MED ORDER — RIVAROXABAN 20 MG PO TABS: 20.0000 mg | ORAL_TABLET | Freq: Every day | ORAL | Status: DC

## 2015-04-30 MED ORDER — SODIUM CHLORIDE 0.9 % IJ SOLN: 10.0000 mL | INTRAMUSCULAR | Status: DC | PRN

## 2015-04-30 MED ORDER — HEPARIN SOD (PORK) LOCK FLUSH 100 UNIT/ML IV SOLN: 250.0000 [IU] | INTRAVENOUS | Status: DC | PRN

## 2015-04-30 MED ORDER — CELECOXIB 200 MG PO CAPS: 200.0000 mg | ORAL_CAPSULE | Freq: Two times a day (BID) | ORAL | Status: DC

## 2015-04-30 MED ORDER — ACETAMINOPHEN 325 MG PO TABS: 650.0000 mg | ORAL_TABLET | Freq: Four times a day (QID) | ORAL | Status: AC | PRN

## 2015-04-30 MED ORDER — HEPARIN SOD (PORK) LOCK FLUSH 100 UNIT/ML IV SOLN
250.0000 [IU] | INTRAVENOUS | Status: AC | PRN
  Administered 2015-04-30: 250 [IU]

## 2015-04-30 MED ORDER — SENNOSIDES-DOCUSATE SODIUM 8.6-50 MG PO TABS: 1.0000 | ORAL_TABLET | Freq: Two times a day (BID) | ORAL | Status: DC

## 2015-04-30 MED ORDER — DEXTROSE 5 % IV SOLN: 2.0000 g | Freq: Three times a day (TID) | INTRAVENOUS | Status: DC

## 2015-04-30 MED ORDER — RIVAROXABAN 15 MG PO TABS: 15.0000 mg | ORAL_TABLET | Freq: Two times a day (BID) | ORAL | Status: DC

## 2015-04-30 MED ORDER — OXYCODONE HCL 5 MG PO TABS: 5.0000 mg | ORAL_TABLET | ORAL | Status: DC | PRN

## 2015-04-30 NOTE — Discharge Instructions (Signed)
Information on my medicine - XARELTO (rivaroxaban)  This medication education was reviewed with me or my healthcare representative as part of my discharge preparation.  The pharmacist that spoke with me during my hospital stay was:  Jabron Weese, Rande Lawman, [place]? Xarelto was prescribed to treat blood clots that may have been found in the veins of your legs (deep vein thrombosis) or in your lungs (pulmonary embolism) and to reduce the risk of them occurring again.  What do you need to know about Xarelto? The starting dose is one 15 mg tablet taken TWICE daily with food for the FIRST 21 DAYS then on (enter date)  12/14  the dose is changed to one 20 mg tablet taken ONCE A DAY with your evening meal.  DO NOT stop taking Xarelto without talking to the health care provider who prescribed the medication.  Refill your prescription for 20 mg tablets before you run out.  After discharge, you should have regular check-up appointments with your healthcare provider that is prescribing your Xarelto.  In the future your dose may need to be changed if your kidney function changes by a significant amount.  What do you do if you miss a dose? If you are taking Xarelto TWICE DAILY and you miss a dose, take it as soon as you remember. You may take two 15 mg tablets (total 30 mg) at the same time then resume your regularly scheduled 15 mg twice daily the next day.  If you are taking Xarelto ONCE DAILY and you miss a dose, take it as soon as you remember on the same day then continue your regularly scheduled once daily regimen the next day. Do not take two doses of Xarelto at the same time.   Important Safety Information Xarelto is a blood thinner medicine that can cause bleeding. You should call your healthcare provider right away if you experience any of the following: ? Bleeding from an injury or your nose that does not stop. ? Unusual colored urine (red or dark brown)  or unusual colored stools (red or black). ? Unusual bruising for unknown reasons. ? A serious fall or if you hit your head (even if there is no bleeding).  Some medicines may interact with Xarelto and might increase your risk of bleeding while on Xarelto. To help avoid this, consult your healthcare provider or pharmacist prior to using any new prescription or non-prescription medications, including herbals, vitamins, non-steroidal anti-inflammatory drugs (NSAIDs) and supplements.  This website has more information on Xarelto: [email or web address]

## 2015-04-30 NOTE — Clinical Social Work Placement (Signed)
   CLINICAL SOCIAL WORK PLACEMENT  NOTE  Date:  04/30/2015  Patient Details  Name: Anthony Osborn MRN: [id number] Date of Birth: 30-Dec-1919  Clinical Social Work is seeking post-discharge placement for this patient at the [place] level of care (*CSW will initial, date and re-position this form in  chart as items are completed):  Yes   Patient/family provided with [place] Work Department's list of facilities offering this level of care within the geographic area requested by the patient (or if unable, by the patient's family).  Yes   Patient/family informed of their freedom to choose among providers that offer the needed level of care, that participate in Medicare, Medicaid or managed care program needed by the patient, have an available bed and are willing to accept the patient.  Yes   Patient/family informed of Anthony's ownership interest in [facility] and [facility], as well as of the fact that they are under no obligation to receive care at these facilities.  PASRR submitted to EDS on 04/29/15     PASRR number received on 04/29/15     Existing PASRR number confirmed on       FL2 transmitted to all facilities in geographic area requested by pt/family on 04/29/15     FL2 transmitted to all facilities within larger geographic area on 04/29/15     Patient informed that his/her managed care company has contracts with or will negotiate with certain facilities, including the following:        Yes   Patient/family informed of bed offers received.  Patient chooses bed at [facility] and [facility] recommends and patient chooses bed at      Patient to be transferred to [facility] on 04/30/15.  Patient to be transferred to facility by Ambulance [name])     Patient family notified on 04/30/15 of transfer.  Name of family member notified:  Wife:  Peggy Nussbaumer     PHYSICIAN Please sign FL2     Additional  Comment: Patient d/c'd to SNF today per order of MD. CSW spoke with wife [name] and son Quita Skye. They had inquired as to placement at [facility] but they were unable to accept patient. This facility is very close to patient's home. They were still pleased to have placement in [place] and agreed to [facility] and [facility]. CSW notified admissions at facility and DC arrangements finalized. No further CSW needs identified. CSW signing off.  Lorie Phenix. Murrell Redden [id number]      _______________________________________________ Williemae Area, LCSW 04/30/2015, 12:39 PM

## 2015-04-30 NOTE — Op Note (Signed)
Dictation Number:   [id number]

## 2015-04-30 NOTE — Progress Notes (Signed)
Physical Therapy Treatment Patient Details Name: Anthony Osborn MRN: [id number] DOB: [birth year] Today's Date: 04/30/2015    History of Present Illness 81 yo male admitted with R knee swelling and pain. pt with cough and mild fever. PMH: HTN Hyperlipidemia, remote CA prostate, Hypothryoidism, CAD s/p CABG. Pt s/p R TKA revision and I& D 11/21    PT Comments    Progressing gradually toward PT goals as evident by ambulation of 16ft this session. Overall mobility level min/mod A. Patient limited by fatigue. Continue to progress as tolerated with anticipation of d/c to SNF.  Follow Up Recommendations  SNF;Supervision/Assistance - 24 hour     Equipment Recommendations  Rolling walker with 5" wheels    Recommendations for Other Services       Precautions / Restrictions Precautions Precautions: Fall;Knee Restrictions Weight Bearing Restrictions: Yes RLE Weight Bearing: Weight bearing as tolerated    Mobility  Bed Mobility Overal bed mobility: Needs Assistance Bed Mobility: Supine to Sit;Sit to Supine     Supine to sit: Min assist;HOB elevated Sit to supine: Mod assist (for elevating bilateral LE onto bed)   General bed mobility comments: HOB 25degrees with assist to move RLE toward EOb, use of rail and cues for trunk elevation as well as scooting forward  Transfers Overall transfer level: Needs assistance Equipment used: Rolling walker (2 wheeled) Transfers: Sit to/from Stand Sit to Stand: Mod assist;From elevated surface;+2 physical assistance         General transfer comment: max multimodal cues for hand placement, sequence, assist for anterior translation and elevation from surface with LLE blocked. Min assist to sit with controlled assist  Ambulation/Gait Ambulation/Gait assistance: Min assist;+2 safety/equipment Ambulation Distance (Feet): 20 Feet Assistive device: Rolling walker (2 wheeled) Gait Pattern/deviations: Step-to pattern;Decreased step length - left;Trunk  flexed     General Gait Details: max cues for sequencing of gait pattern with rolling walker, posture, safe positioning of RW, fatigues quickly with chair to follow   Stairs            Wheelchair Mobility    Modified Rankin (Stroke Patients Only)       Balance Overall balance assessment: Needs assistance Sitting-balance support: Bilateral upper extremity supported;Feet supported Sitting balance-Leahy Scale: Good     Standing balance support: Bilateral upper extremity supported Standing balance-Leahy Scale: Poor                      Cognition Arousal/Alertness: Awake/alert Behavior During Therapy: WFL for tasks assessed/performed Overall Cognitive Status: Impaired/Different from baseline Area of Impairment: Following commands       Following Commands: Follows one step commands with increased time            Exercises      General Comments General comments (skin integrity, edema, etc.): RN requested pt back in bed for PICC line placement      Pertinent Vitals/Pain Pain Assessment: Faces Faces Pain Scale: Hurts whole lot Pain Location:  (Rt knee) Pain Descriptors / Indicators: Sore (only with movement)    Home Living                      Prior Function            PT Goals (current goals can now be found in the care plan section) Acute Rehab PT Goals Patient Stated Goal: walk PT Goal Formulation: With patient/family Time For Goal Achievement: 05/06/15 Potential to Achieve Goals: Fair Progress towards PT goals: Progressing toward  goals    Frequency  Min 5X/week    PT Plan Frequency needs to be updated;Current plan remains appropriate    Co-evaluation             End of Session Equipment Utilized During Treatment: Gait belt Activity Tolerance: Patient tolerated treatment well Patient left: in bed;with call bell/phone within reach;with nursing/sitter in room     Time: [id number] PT Time Calculation (min) (ACUTE  ONLY): 24 min  Charges:  $Gait Training: 23-37 mins                    G CodesDarliss Cheney, PTA [phone] 04/30/2015, 11:21 AM

## 2015-04-30 NOTE — Op Note (Signed)
NAMEARL, VANDORN                 ACCOUNT NO.:  [id number]  MEDICAL RECORD NO.:  [id number]  LOCATION:  5N04C                        FACILITY:  [facility]  PHYSICIAN:  Estill Bamberg. Ronnie Derby, M.D. DATE OF BIRTH:  01/12/1920  DATE OF PROCEDURE:  04/28/2015 DATE OF DISCHARGE:                              OPERATIVE REPORT   ASSISTANT:  Nehemiah Massed, PA-C.  ANESTHESIA:  General.  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  PROCEDURE:  Right excision arthroplasty, deep irrigation and debridement and revision arthroplasty.  INDICATION FOR PROCEDURE:  The patient is a 81 year old, white male, with bilateral total knees done over 20 years ago in [facility]. He presented to my office last week, with a septic knee.  He was transferred to the hospital and admitted to the hospitalist service. Infectious Disease was engaged as well as Cardiology.  Once he was cleared for surgery he was placed on the operative schedule.  Informed consent was obtained.  DESCRIPTION OF PROCEDURE:  The patient was laid supine and administered general anesthesia.  Right leg was prepped and draped in the usual fashion.  The tourniquet was elevated, but the extremity not exsanguinated.  Midline incision was made with a #10 blade over the old incision.  New blade was used to make a median parapatellar arthrotomy and purulent fluid evacuated.  I then removed the patella, which was grossly loose.  It was actually fractured.  The femoral component also had a fracture.  Once I got to 90 degrees of flexion I could easily remove the femoral component with my fingers.  I did remove lots of debris.  I performed a complete synovectomy very meticulous to remove all soft tissue that looked to be nonviable.  I then used an osteotome to easily free up the tibial component, removed  that and removed all of the cement and debris from the tibia.  The lateral femoral condyle had a significant amount of bone loss.  The tibia had some areas of scalloping.  I then reamed the femur to a 14 mm and the tibia to 12 mm. I then cut the box for the femur to accept a CCK polyethylene.  I then placed a size E with a 14 stem onto the femur.  I made a cut through the lateral femoral condylar slot for a 10 mm distal augment and a 5 mm posterior augment.  This recreated the femur well.  I then turned my attention to the tibia where I placed an intramedullary guide on and freshened up the tibial cut so that we had a good platform for our base plate.  I then used a base for a drill for the stem through a size E base plate, camped down the keel and then inserted my trial E tibia with no augmentation and a size 12 stem.  I then placed the femur back on and then trial was several different poly thicknesses and accepted a size 20 CCK.  This afforded excellent stability.  I then placed the leg in extension, everted the patella, and drilled 3 lug  holes for a 38 mm trial and with a 38 patella in place and recreated the thickness of the patella to about 20-22 mm.  It tracked very well.  I then removed all the components and copiously irrigated again all in all, I irrigated 4000 mL of LR through this knee during the course of the case.  I then mixed up the [place] G cement, cemented in the femur first, removed excess cement, then the tibia removing excess cement, snapped in the CCK polyethylene, screwed it into place.  Placed the leg in extension, and cemented in the patella and removed excess cement there as well.  I then left the Hemovac drain coming out superolaterally.  I then let the tourniquet down and maintained hemostasis.  I did have a topical TXA in while the cement was hardening.  I then closed the arthrotomy with figure-of-eight #1 Vicryl sutures.  The deep soft tissues with #0  Vicryl sutures, subcuticular #2-0 Vicryl sutures and skin staples.  Dressed with Xeroform dressing, sponges, sterile Webril, and a Ted stocking.  COMPLICATION:  None.  DRAINS:  One Hemovac.  EBL:  300 mL.          ______________________________ Estill Bamberg. Ronnie Derby, M.D.     SDL/MEDQ  D:  04/30/2015  T:  04/30/2015  Job:  [id number]

## 2015-04-30 NOTE — Progress Notes (Signed)
SPORTS MEDICINE AND JOINT REPLACEMENT  Lara Mulch, MD   Carlynn Spry, PA-C [address], [place], [place]  [address]                             [phone]   PROGRESS NOTE  Subjective:  negative for Chest Pain  negative for Shortness of Breath  negative for Nausea/Vomiting   negative for Calf Pain  negative for Bowel Movement   Tolerating Diet: yes         Patient reports pain as 3 on 0-10 scale.    Objective: Vital signs in last 24 hours:   Patient Vitals for the past 24 hrs:  BP Temp Temp src Pulse Resp SpO2 Weight  04/30/15 0548 126/66 mmHg 97.3 F (36.3 C) Oral 71 17 (!) 36 % 103.647 kg (228 lb 8 oz)  04/29/15 2018 139/69 mmHg 97.6 F (36.4 C) Oral 79 18 96 % -  04/29/15 1848 132/74 mmHg 97.9 F (36.6 C) Oral 74 18 92 % -  04/29/15 1312 - - - 82 - - -  04/29/15 1245 - - - - - 92 % -  04/29/15 1017 (!) 123/59 mmHg - - 70 - - -    @flow {1959:LAST@   Intake/Output from previous day:   11/22 0701 - 11/23 0700 In: -  Out: 1050 [Urine:1050]   Intake/Output this shift:       Intake/Output      11/22 0701 - 11/23 0700 11/23 0701 - 11/24 0700   P.O.     I.V. (mL/kg)     IV Piggyback     Total Intake(mL/kg)     Urine (mL/kg/hr) 1050 (0.4)    Stool     Blood     Total Output 1050     Net -1050             LABORATORY DATA:  Recent Labs  04/27/15 0358 04/27/15 1045 04/28/15 0222 04/29/15 0545 04/30/15 0549  WBC 14.2* 13.1* 13.3* 16.4* 13.1*  HGB 14.2 13.7 13.1 13.6 12.7*  HCT 42.7 41.3 39.3 41.9 38.9*  PLT 340 337 319 350 327    Recent Labs  04/26/15 0805 04/27/15 1045 04/28/15 0222 04/29/15 0545  NA 135 133* 136 136  K 3.7 3.5 4.2 4.8  CL 101 102 104 104  CO2 28 24 25 24   BUN 19 21* 20 20  CREATININE 0.69 0.62 0.64 0.62  GLUCOSE 103* 162* 99 112*  CALCIUM 8.3* 8.2* 8.2* 8.3*   Lab Results  Component Value Date   INR 1.35 04/29/2015   INR 1.35 04/28/2015   INR 1.41 04/21/2015    Examination:  General appearance:  alert, cooperative and no distress Extremities: Homans sign is negative, no sign of DVT  Wound Exam: clean, dry, intact   Drainage:  None: wound tissue dry  Motor Exam: Quadriceps and Hamstrings Intact  Sensory Exam: Deep Peroneal normal   Assessment:    2 Days Post-Op  Procedure(s) (LRB): INCISION AND DRAINAGE (Right) TOTAL KNEE REVISION (Right)  ADDITIONAL DIAGNOSIS:  Principal Problem:   Septic arthritis of knee, right (HCC) Active Problems:   Hyperlipidemia   Prostate cancer (HCC)   CAD (coronary artery disease)   Cough   Hyperthyroidism   Pyogenic arthritis of right knee joint (HCC)   Essential hypertension   Encounter for palliative care   DVT (deep venous thrombosis) (HCC)  Acute Blood Loss Anemia   Plan: Physical Therapy  as ordered Weight Bearing as Tolerated (WBAT)  SNF for postoperative care [facility] preferred  Right knee infection - antibiotics per Infectious Disease  Bilateral DVT preoperatively - anticoagulation per medicine  Okay to discharge to SNF from ortho standpoint, WBAT, follow up in 2 weeks, dressing changed, should be changed every other day, okay to get wet with soap and water, DO NOT submerge in water.    Anthony Osborn 04/30/2015, 7:16 AM

## 2015-04-30 NOTE — Progress Notes (Signed)
Peripherally Inserted Central Catheter/Midline Placement  The IV Nurse has discussed with the patient and/or persons authorized to consent for the patient, the purpose of this procedure and the potential benefits and risks involved with this procedure.  The benefits include less needle sticks, lab draws from the catheter and patient may be discharged home with the catheter.  Risks include, but not limited to, infection, bleeding, blood clot (thrombus formation), and puncture of an artery; nerve damage and irregular heat beat.  Alternatives to this procedure were also discussed.  PICC/Midline Placement Documentation  PICC / Midline Single Lumen [id number] PICC Right Basilic 41 cm 0 cm (Active)  Indication for Insertion or Continuance of Line Home intravenous therapies (PICC only) 04/30/2015 10:53 AM  Exposed Catheter (cm) 0 cm 04/30/2015 10:53 AM  Dressing Change Due 05/07/15 04/30/2015 10:53 AM    Consent done by Claretha Cooper RN   Marianna Payment M 04/30/2015, 10:53 AM

## 2015-04-30 NOTE — Progress Notes (Signed)
Report called to RN at [facility].

## 2015-04-30 NOTE — Discharge Summary (Signed)
PATIENT DETAILS Name: Anthony Osborn Age: 81 y.o. Sex: male Date of Birth: December 18, 1919 MRN: [id number]. Admitting Physician: Ivor Costa, MD EYE:[id number],SLPNP, MD  Admit Date: 04/21/2015 Discharge date: 04/30/2015  Recommendations for Outpatient Follow-up:  1. Ceftazidime end date-06/10/15 2. Weekly CBC/CMET,CRP,ESR faxed to [phone] 3. Ensure follow up with ID, Ortho  PRIMARY DISCHARGE DIAGNOSIS:  Principal Problem:   Septic arthritis of knee, right (HCC) Active Problems:   Hyperlipidemia   Prostate cancer (HCC)   CAD (coronary artery disease)   Cough   Hyperthyroidism   Pyogenic arthritis of right knee joint ([place])   Essential hypertension   Encounter for palliative care   DVT (deep venous thrombosis) ([place])      PAST MEDICAL HISTORY: Past Medical History  Diagnosis Date  . Hypertension   . Hyperlipidemia   . Prostate cancer ([place])     3 YEARS AGO(BEING OBSERVED)  . Hyperthyroidism   . CAD (coronary artery disease)     DISCHARGE MEDICATIONS: Current Discharge Medication List    START taking these medications   Details  cefTAZidime 2 g in dextrose 5 % 50 mL Inject 2 g into the vein every 8 (eight) hours. From 04/29/15 to 06/10/15    celecoxib (CELEBREX) 200 MG capsule Take 1 capsule (200 mg total) by mouth every 12 (twelve) hours.    oxyCODONE (ROXICODONE) 5 MG immediate release tablet Take 1 tablet (5 mg total) by mouth every 4 (four) hours as needed for severe pain. Qty: 30 tablet, Refills: 0    !! Rivaroxaban (XARELTO) 15 MG TABS tablet Take 1 tablet (15 mg total) by mouth 2 (two) times daily with a meal. First dose on Tue 04/29/15 at 1700, For 43 doses Qty: 30 tablet    !! rivaroxaban (XARELTO) 20 MG TABS tablet Take 1 tablet (20 mg total) by mouth daily with supper. First dose on Wed 05/21/15 at 1700    senna-docusate (SENOKOT-S) 8.6-50 MG tablet Take 1 tablet by mouth 2 (two) times daily.     !! - Potential duplicate medications found. Please discuss  with provider.    CONTINUE these medications which have CHANGED   Details  acetaminophen (TYLENOL) 325 MG tablet Take 2 tablets (650 mg total) by mouth every 6 (six) hours as needed for mild pain (or Fever >/= 101).      CONTINUE these medications which have NOT CHANGED   Details  aspirin 81 MG tablet Take 81 mg by mouth daily.      CALCIUM-MAGNESIUM-ZINC PO Take 1 tablet by mouth 3 (three) times daily.     carvedilol (COREG) 3.125 MG tablet Take 3.125 mg by mouth 2 (two) times daily.    methimazole (TAPAZOLE) 10 MG tablet Take 5 mg by mouth daily.    simvastatin (ZOCOR) 10 MG tablet Take 10 mg by mouth at bedtime.      Cholecalciferol (VITAMIN D-3 PO) Take 1,000 Units by mouth daily.      lisinopril-hydrochlorothiazide (PRINZIDE,ZESTORETIC) 10-12.5 MG per tablet Take 1 tablet by mouth daily.          ALLERGIES:  No Known Allergies  BRIEF HPI:  See H&P, Labs, Consult and Test reports for all details in brief,Anthony Osborn is a 81 y.o. male with PMH of hypertension, hyperlipidemia, remote prostate cancer, hypothyroidism, CAD, s/p of CABG, who presented with right knee swelling and pain.  CONSULTATIONS:   cardiology, ID and orthopedic surgery  PERTINENT RADIOLOGIC STUDIES: Ct Knee Right Wo Contrast  04/21/2015  CLINICAL DATA:  Twisting  injury to right knee while working in garden. Unable to bear weight, with swelling extending to the foot. Initial encounter. EXAM: CT OF THE RIGHT KNEE WITHOUT CONTRAST TECHNIQUE: Multidetector CT imaging of the right knee was performed according to the standard protocol. Multiplanar CT image reconstructions were also generated. COMPARISON:  None. FINDINGS: There is significant lucency noted about the tibial plateau, underlying the prosthesis. This may reflect some degree of chronic loosening about the plate. There is also lucency underlying the femoral component of the prosthesis, concerning for loosening, and minimally underlying the patellofemoral  compartment. No definite acute fracture is seen. Note is made of a moderate complex knee joint effusion, with mild loculation. This is markedly thick walled. The appearance suggests chronic synovial inflammation, though underlying infection cannot be excluded. A tiny focus of air within the collection reflects recent aspiration of fluid. Diffuse soft tissue edema is noted tracking along the right knee and lower leg. Varices are seen along the right lower leg. Diffuse vascular calcifications are noted. IMPRESSION: 1. Significant lucency about the tibial plateau, underlying the prostheses. This may reflect some degree of chronic loosening about the plate. Lucency underlying the femoral component of the prosthesis, and minimally underlying the patellofemoral component, also concerning for loosening. No definite acute fracture seen. 2. Moderate complex knee joint effusion, with mild loculation. This is markedly thick walled. The appearance suggests chronic synovial inflammation, though underlying infection cannot be excluded. Would correlate with lab findings from recently aspirated fluid. 3. Diffuse soft tissue edema tracking along the right knee and lower leg. 4. Diffuse vascular calcifications seen. 5. Varices noted along the right lower leg. Electronically Signed   By: Garald Balding M.D.   On: 04/21/2015 21:16   Portable Chest 1 View  04/21/2015  CLINICAL DATA:  Right knee pain and swelling, worsening over the last 4 days. Cough. Elevated white count. EXAM: PORTABLE CHEST 1 VIEW COMPARISON:  03/25/2009 FINDINGS: There has been previous median sternotomy and CABG. The heart is enlarged. There is venous hypertension with interstitial edema worrisome for congestive heart failure. Patchy density at the lung bases could also represent pneumonia. No effusions. Bony structures are unremarkable. IMPRESSION: Interval CABG. Cardiomegaly. Pulmonary venous hypertension with interstitial edema. Patchy density at the bases  could relate to edema and atelectasis or could represent mild basilar pneumonia. Electronically Signed   By: Nelson Chimes M.D.   On: 04/21/2015 23:18     PERTINENT LAB RESULTS: CBC:  Recent Labs  04/29/15 0545 04/30/15 0549  WBC 16.4* 13.1*  HGB 13.6 12.7*  HCT 41.9 38.9*  PLT 350 327   CMET CMP     Component Value Date/Time   NA 136 04/30/2015 0549   K 4.0 04/30/2015 0549   CL 105 04/30/2015 0549   CO2 25 04/30/2015 0549   GLUCOSE 108* 04/30/2015 0549   BUN 15 04/30/2015 0549   CREATININE 0.56* 04/30/2015 0549   CALCIUM 8.4* 04/30/2015 0549   PROT 4.9* 04/28/2015 0222   ALBUMIN 1.7* 04/28/2015 0222   AST 55* 04/28/2015 0222   ALT 54 04/28/2015 0222   ALKPHOS 177* 04/28/2015 0222   BILITOT 0.7 04/28/2015 0222   GFRNONAA >60 04/30/2015 0549   GFRAA >60 04/30/2015 0549    GFR Estimated Creatinine Clearance: 72.9 mL/min (by C-G formula based on Cr of 0.56). No results for input(s): LIPASE, AMYLASE in the last 72 hours. No results for input(s): CKTOTAL, CKMB, CKMBINDEX, TROPONINI in the last 72 hours. Invalid input(s): POCBNP No results for input(s): DDIMER  in the last 72 hours. No results for input(s): HGBA1C in the last 72 hours. No results for input(s): CHOL, HDL, LDLCALC, TRIG, CHOLHDL, LDLDIRECT in the last 72 hours. No results for input(s): TSH, T4TOTAL, T3FREE, THYROIDAB in the last 72 hours.  Invalid input(s): FREET3 No results for input(s): VITAMINB12, FOLATE, FERRITIN, TIBC, IRON, RETICCTPCT in the last 72 hours. Coags:  Recent Labs  04/28/15 0222 04/29/15 0545  INR 1.35 1.35   Microbiology: Recent Results (from the past 240 hour(s))  Culture, body fluid-bottle     Status: None   Collection Time: 04/21/15  8:02 PM  Result Value Ref Range Status   Specimen Description FLUID RIGHT KNEE  Final   Special Requests NONE  Final   Gram Stain   Final    GRAM NEGATIVE RODS CRITICAL RESULT CALLED TO, READ BACK BY AND VERIFIED WITH: IAnnamaria Helling RN 10:45  04/22/15 ([email or web address])    Culture PSEUDOMONAS AERUGINOSA  Final   Report Status 04/24/2015 FINAL  Final   Organism ID, Bacteria PSEUDOMONAS AERUGINOSA  Final      Susceptibility   Pseudomonas aeruginosa - MIC*    CEFTAZIDIME <=1 SENSITIVE Sensitive     CIPROFLOXACIN <=0.25 SENSITIVE Sensitive     GENTAMICIN <=1 SENSITIVE Sensitive     IMIPENEM 1 SENSITIVE Sensitive     PIP/TAZO <=4 SENSITIVE Sensitive     CEFEPIME <=1 SENSITIVE Sensitive     * PSEUDOMONAS AERUGINOSA  Gram stain     Status: None   Collection Time: 04/21/15  8:02 PM  Result Value Ref Range Status   Specimen Description FLUID RIGHT KNEE  Final   Special Requests NONE  Final   Gram Stain   Final    ABUNDANT WBC PRESENT, PREDOMINANTLY PMN NO ORGANISMS SEEN    Report Status 04/21/2015 FINAL  Final  Culture, blood (routine x 2)     Status: None   Collection Time: 04/21/15 10:50 PM  Result Value Ref Range Status   Specimen Description BLOOD LEFT ARM  Final   Special Requests BOTTLES DRAWN AEROBIC AND ANAEROBIC 5CC  Final   Culture NO GROWTH 5 DAYS  Final   Report Status 04/26/2015 FINAL  Final  Culture, blood (routine x 2)     Status: None   Collection Time: 04/21/15 11:00 PM  Result Value Ref Range Status   Specimen Description BLOOD LEFT FOREARM  Final   Special Requests BOTTLES DRAWN AEROBIC AND ANAEROBIC 10CC  Final   Culture NO GROWTH 5 DAYS  Final   Report Status 04/26/2015 FINAL  Final  Surgical PCR screen     Status: None   Collection Time: 04/28/15  9:17 AM  Result Value Ref Range Status   MRSA, PCR NEGATIVE NEGATIVE Final   Staphylococcus aureus NEGATIVE NEGATIVE Final    Comment:        The Xpert SA Assay (FDA approved for NASAL specimens in patients over 53 years of age), is one component of a comprehensive surveillance program.  Test performance has been validated by [facility] for patients greater than or equal to 56 year old. It is not intended to diagnose infection nor to guide or monitor  treatment.      BRIEF HOSPITAL COURSE:   Principal Problem: Septic arthritis of knee, right:seen by Orthopedics and underwent Right excision arthroplasty, deep irrigation and debridement and revision arthroplasty on 04/28/15. Cultures posItive for Pseudomonas-seen by ID-recommendations are for IV Tressie Ellis till Jan 3rd. Patient will need weekly CBC/CMET/CRP/ESR faxed to ID clinic at [phone]. Afebrile, awake and alert at the time of discharge. Please ensure follow up with Orthopedics-recommendations from ortho are for WBAT, dressing should be changed every other day, okay to get wet with soap and water, DO NOT submerge in water and to follow up with Dr Ronnie Derby in 2 weeks.Please also ensure follow up with ID  Active Problems: Bilateral FMB:WGYKZL the hospital course patient was found to be having swelling of both legs,Vascular Doppler was positive for bilateral DVT.as initially on heparin. After discussion with orthopedic the patient will be switched to Xarelto.Will likely require Anticoagulation for 3 months-likely provoked DVT. Please consider repeat Doppler and hematology consultation prior to discontinuing anticoagulation.  CAD/p triple-vessel CABG on 03/17/2011. :seen by cards during hospital stay-remains stable-continue ASA/Statin and Coreg.  Chronic Systolic CHF-EF by echo 93-57% on 04/23/2015:clinically compensated. Continue Coreg-resume Lisinopril/HCTZ on discharge. Ensure follow up with patient's primary cardiologist.   Hyperlipidemia:continue Statin  Prostate cancer:ensure follow up with primary oncologist/Urologist  Hyperthyroidism:continue Tapazole-ensure periodic FT4 as outpatient.TSH was within normal limits  Essential hypertension:controlled-continue Coreg, resume Lisinopril/HCTZ and optimize in the outpatient setting  TODAY-DAY OF DISCHARGE:  Subjective:   Anthony Osborn today has no headache,no chest abdominal pain,no new weakness tingling or numbness, feels much better  wants to go home today.   Objective:   Blood pressure 126/66, pulse 71, temperature 97.3 F (36.3 C), temperature source Oral, resp. rate 17, height 6' 1" (1.854 m), weight 103.647 kg (228 lb 8 oz), SpO2 36 %.  Intake/Output Summary (Last 24 hours) at 04/30/15 0846 Last data filed at 04/30/15 0500  Gross per 24 hour  Intake      0 ml  Output   1050 ml  Net  -1050 ml   Filed Weights   04/23/15 2029 04/27/15 2056 04/30/15 0548  Weight: 78.6 kg (173 lb 4.5 oz) 78.7 kg (173 lb 8 oz) 103.647 kg (228 lb 8 oz)    Exam Awake Alert, Oriented *3, No new F.N deficits, Normal affect [place].AT,PERRAL Supple Neck,No JVD, No cervical lymphadenopathy appriciated.  Symmetrical Chest wall movement, Good air movement bilaterally, CTAB RRR,No Gallops,Rubs or new Murmurs, No Parasternal Heave +ve B.Sounds, Abd Soft, Non tender, No organomegaly appriciated, No rebound -guarding or rigidity. No Cyanosis, Clubbing or edema, No new Rash or bruise  DISCHARGE CONDITION: Stable  DISPOSITION: SNF  DISCHARGE INSTRUCTIONS:    Activity:  WBATwith Full fall precautions use walker/cane & assistance as needed  Get Medicines reviewed and adjusted: Please take all your medications with you for your next visit with your Primary MD  Please request your Primary MD to go over all hospital tests and procedure/radiological results at the follow up, please ask your Primary MD to get all Hospital records sent to his/her office.  If you experience worsening of your admission symptoms, develop shortness of breath, life threatening emergency, suicidal or homicidal thoughts you must seek medical attention immediately by calling 911 or calling your MD immediately  if symptoms less severe.  You must read complete instructions/literature along with all the possible adverse reactions/side effects for all the Medicines you take and that have been prescribed to you. Take any new Medicines after you have completely understood and  accpet all the possible adverse reactions/side effects.   Do not drive when taking Pain medications.   Do not take more than prescribed Pain, Sleep and Anxiety Medications  Special Instructions: If you have smoked or chewed Tobacco  in the last 2 yrs please stop smoking, stop any regular Alcohol  and or any Recreational drug use.  Wear Seat belts while driving.  Please note  You were cared for by a hospitalist during your hospital stay. Once you are discharged, your primary care physician will handle any further medical issues. Please note that NO REFILLS for any discharge medications will be authorized once you are discharged, as it is imperative that you return to your primary care physician (or establish a relationship with a primary care physician if you do not have one) for your aftercare needs so that they can reassess your need for medications and monitor your lab values.   Diet recommendation: Heart Healthy diet Fluid restriction 1.5 lit/day   Discharge Instructions    Call MD for:  redness, tenderness, or signs of infection (pain, swelling, redness, odor or green/yellow discharge around incision site)    Complete by:  As directed      Call MD for:  severe uncontrolled pain    Complete by:  As directed      Call MD for:  temperature >100.4    Complete by:  As directed      Diet - low sodium heart healthy    Complete by:  As directed      Discharge wound care:    Complete by:  As directed   dressing should be changed every other day, okay to get wet with soap and water, DO NOT submerge in water.     Increase activity slowly    Complete by:  As directed   WBAT           Follow-up Information    Follow up with [facility] .   Specialty:  [place]   Contact information:   [address] [place] [phone]      Follow up with Rudean Haskell, MD. Call on 05/13/2015.   Specialty:  Orthopedic Surgery     Why:  Hospital follow up-make appt in 2 weeks   Contact information:   [place] [address] [phone]       Follow up with Cyndy Freeze, MD. Schedule an appointment as soon as possible for a visit in 2 weeks.   Specialty:  Family Medicine   Contact information:   [place] [address] [phone]       Follow up with Carlyle Basques, MD. Schedule an appointment as soon as possible for a visit in 3 weeks.   Specialty:  Infectious Diseases   Contact information:   [address] [place] [address] [phone]       Total Time spent on discharge equals 45 minutes.  SignedOren Binet 04/30/2015 8:46 AM

## 2015-05-01 DIAGNOSIS — T814XXA Infection following a procedure, initial encounter: Secondary | ICD-10-CM | POA: Diagnosis not present

## 2015-05-01 DIAGNOSIS — M009 Pyogenic arthritis, unspecified: Secondary | ICD-10-CM | POA: Diagnosis not present

## 2015-05-01 DIAGNOSIS — T82524A Displacement of infusion catheter, initial encounter: Secondary | ICD-10-CM | POA: Diagnosis not present

## 2015-05-01 DIAGNOSIS — Z452 Encounter for adjustment and management of vascular access device: Secondary | ICD-10-CM | POA: Diagnosis not present

## 2015-05-02 DIAGNOSIS — A498 Other bacterial infections of unspecified site: Secondary | ICD-10-CM | POA: Insufficient documentation

## 2015-05-06 DIAGNOSIS — B372 Candidiasis of skin and nail: Secondary | ICD-10-CM | POA: Diagnosis not present

## 2015-05-06 DIAGNOSIS — Z86718 Personal history of other venous thrombosis and embolism: Secondary | ICD-10-CM | POA: Diagnosis not present

## 2015-05-06 DIAGNOSIS — I10 Essential (primary) hypertension: Secondary | ICD-10-CM | POA: Diagnosis not present

## 2015-05-06 DIAGNOSIS — M00861 Arthritis due to other bacteria, right knee: Secondary | ICD-10-CM | POA: Diagnosis not present

## 2015-05-07 DIAGNOSIS — M00861 Arthritis due to other bacteria, right knee: Secondary | ICD-10-CM | POA: Diagnosis not present

## 2015-05-12 DIAGNOSIS — M25461 Effusion, right knee: Secondary | ICD-10-CM | POA: Diagnosis not present

## 2015-05-12 DIAGNOSIS — Z96651 Presence of right artificial knee joint: Secondary | ICD-10-CM | POA: Diagnosis not present

## 2015-05-15 DIAGNOSIS — R338 Other retention of urine: Secondary | ICD-10-CM | POA: Diagnosis not present

## 2015-05-15 DIAGNOSIS — R310 Gross hematuria: Secondary | ICD-10-CM | POA: Diagnosis not present

## 2015-05-21 DIAGNOSIS — T18128A Food in esophagus causing other injury, initial encounter: Secondary | ICD-10-CM | POA: Diagnosis not present

## 2015-05-21 DIAGNOSIS — T18108A Unspecified foreign body in esophagus causing other injury, initial encounter: Secondary | ICD-10-CM | POA: Diagnosis not present

## 2015-05-27 ENCOUNTER — Ambulatory Visit (INDEPENDENT_AMBULATORY_CARE_PROVIDER_SITE_OTHER): Payer: Medicare Other | Admitting: Internal Medicine

## 2015-05-27 ENCOUNTER — Encounter: Payer: Self-pay | Admitting: Internal Medicine

## 2015-05-27 VITALS — BP 120/63 | HR 68 | Temp 98.5°F

## 2015-05-27 DIAGNOSIS — T8450XS Infection and inflammatory reaction due to unspecified internal joint prosthesis, sequela: Secondary | ICD-10-CM

## 2015-05-27 DIAGNOSIS — B965 Pseudomonas (aeruginosa) (mallei) (pseudomallei) as the cause of diseases classified elsewhere: Secondary | ICD-10-CM | POA: Diagnosis not present

## 2015-05-27 DIAGNOSIS — A498 Other bacterial infections of unspecified site: Secondary | ICD-10-CM

## 2015-05-27 LAB — CBC WITH DIFFERENTIAL/PLATELET
BASOS PCT: 0 % (ref 0–1)
Basophils Absolute: 0 10*3/uL (ref 0.0–0.1)
Eosinophils Absolute: 1.2 10*3/uL — ABNORMAL HIGH (ref 0.0–0.7)
Eosinophils Relative: 12 % — ABNORMAL HIGH (ref 0–5)
HEMATOCRIT: 37.6 % — AB (ref 39.0–52.0)
HEMOGLOBIN: 12.1 g/dL — AB (ref 13.0–17.0)
LYMPHS ABS: 1.9 10*3/uL (ref 0.7–4.0)
LYMPHS PCT: 20 % (ref 12–46)
MCH: 30 pg (ref 26.0–34.0)
MCHC: 32.2 g/dL (ref 30.0–36.0)
MCV: 93.1 fL (ref 78.0–100.0)
MONOS PCT: 8 % (ref 3–12)
MPV: 10.3 fL (ref 8.6–12.4)
Monocytes Absolute: 0.8 10*3/uL (ref 0.1–1.0)
NEUTROS ABS: 5.8 10*3/uL (ref 1.7–7.7)
NEUTROS PCT: 60 % (ref 43–77)
Platelets: 237 10*3/uL (ref 150–400)
RBC: 4.04 MIL/uL — ABNORMAL LOW (ref 4.22–5.81)
RDW: 14 % (ref 11.5–15.5)
WBC: 9.6 10*3/uL (ref 4.0–10.5)

## 2015-05-27 LAB — BASIC METABOLIC PANEL
BUN: 24 mg/dL (ref 7–25)
CHLORIDE: 101 mmol/L (ref 98–110)
CO2: 26 mmol/L (ref 20–31)
CREATININE: 0.73 mg/dL (ref 0.70–1.11)
Calcium: 8.9 mg/dL (ref 8.6–10.3)
GLUCOSE: 103 mg/dL — AB (ref 65–99)
POTASSIUM: 4.8 mmol/L (ref 3.5–5.3)
Sodium: 136 mmol/L (ref 135–146)

## 2015-05-27 LAB — C-REACTIVE PROTEIN: CRP: 0.9 mg/dL — ABNORMAL HIGH (ref <0.60)

## 2015-05-27 NOTE — Progress Notes (Signed)
Rfv: hospital follow up for psa pji of left knee Subjective:    Patient ID: Anthony Osborn, male    DOB: [birth year], 81 y.o.   MRN: [id number]  HPI Anthony Osborn is a 81yo M with multiple medical problems including mild dementia. He has hx of bilateral TKA, with right knee components fractured from a fall which recently became infected. 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 He underwent 1 staged revision, on 11/21. with old hardware removed, and new implant placed on  Dr. Ronnie Derby. He was discharged to SNF with 6 wk ceftaz. He is doing well thus far. He denies diarrhea. Still has warmth and swelling to right knee but overall it has healed well. He sees dr. Ronnie Derby at end of the month. The are expected to get discharged from snf on 12/23 but then will need to get ivf abtx at home for the remainder to end on 06/10/2015  No Known Allergies Current Outpatient Prescriptions on File Prior to Visit  Medication Sig Dispense Refill  . acetaminophen (TYLENOL) 325 MG tablet Take 2 tablets (650 mg total) by mouth every 6 (six) hours as needed for mild pain (or Fever >/= 101).    Marland Kitchen aspirin 81 MG tablet Take 81 mg by mouth daily.      Marland Kitchen CALCIUM-MAGNESIUM-ZINC PO Take 1 tablet by mouth 3 (three) times daily.     . carvedilol (COREG) 3.125 MG tablet Take 3.125 mg by mouth 2 (two) times daily.    . cefTAZidime 2 g in dextrose 5 % 50 mL Inject 2 g into the vein every 8 (eight) hours. From 04/29/15 to 06/10/15    . celecoxib (CELEBREX) 200 MG capsule Take 1 capsule (200 mg total) by mouth every 12 (twelve) hours.    . Cholecalciferol (VITAMIN D-3 PO) Take 1,000 Units by mouth daily.      Marland Kitchen lisinopril-hydrochlorothiazide (PRINZIDE,ZESTORETIC) 10-12.5 MG per tablet Take 1 tablet by mouth daily.      . methimazole (TAPAZOLE) 10 MG tablet Take 5 mg by mouth daily.    Marland Kitchen oxyCODONE (ROXICODONE) 5 MG immediate release tablet Take 1 tablet (5 mg total) by mouth every 4 (four) hours as needed  for severe pain. 30 tablet 0  . Rivaroxaban (XARELTO) 15 MG TABS tablet Take 1 tablet (15 mg total) by mouth 2 (two) times daily with a meal. First dose on Tue 04/29/15 at 1700, For 43 doses 30 tablet   . rivaroxaban (XARELTO) 20 MG TABS tablet Take 1 tablet (20 mg total) by mouth daily with supper. First dose on Wed 05/21/15 at 1700    . senna-docusate (SENOKOT-S) 8.6-50 MG tablet Take 1 tablet by mouth 2 (two) times daily.    . simvastatin (ZOCOR) 10 MG tablet Take 10 mg by mouth at bedtime.       No current facility-administered medications on file prior to visit.   Active Ambulatory Problems    Diagnosis Date Noted  . Hyperlipidemia   . Prostate cancer ([place])   . CAD (coronary artery disease) 04/21/2015  . Septic arthritis of knee, right ([place]) 04/21/2015  . Cough 04/21/2015  . Hyperthyroidism   . Pyogenic arthritis of right knee joint ([place])   . Essential hypertension   . Encounter for palliative care   . DVT (deep venous thrombosis) ([place]) 04/27/2015  . Pseudomonas infection    Resolved Ambulatory Problems    Diagnosis Date Noted  . No Resolved Ambulatory Problems   Past Medical History  Diagnosis Date  . Hypertension    Social History  Substance Use Topics  . Smoking status: Never Smoker   . Smokeless tobacco: None  . Alcohol Use: No  family history includes Diabetes in his sister; Heart disease in his brother; Stroke in his father..    Review of Systems + warmth and swelling to right knee    Objective:   Physical Exam BP 120/63 mmHg  Pulse 68  Temp(Src) 98.5 F (36.9 C) (Oral) Physical Exam  Constitutional: He is oriented to person, place, and time. He appears well-developed and well-nourished. No distress.  HENT:  Mouth/Throat: Oropharynx is clear and moist. No oropharyngeal exudate.  Cardiovascular: Normal rate, regular rhythm and normal heart sounds. Exam reveals no gallop and no friction rub.  No murmur heard.  Pulmonary/Chest: Effort normal and breath  sounds normal. No respiratory distress. He has no wheezes.  Abdominal: Soft. Bowel sounds are normal. He exhibits no distension. There is no tenderness.  Lymphadenopathy:  He has no cervical adenopathy.  Neurological: He is alert and oriented to person, place, and time.  Skin: Skin is warm and dry. No rash noted. No erythema.  Psychiatric: He has a normal mood and affect. His behavior is normal.   Lab Results  Component Value Date   ESRSEDRATE 28* 05/27/2015   Lab Results  Component Value Date   CRP 0.9* 05/27/2015   BMET    Component Value Date/Time   NA 136 05/27/2015 1645   K 4.8 05/27/2015 1645   CL 101 05/27/2015 1645   CO2 26 05/27/2015 1645   GLUCOSE 103* 05/27/2015 1645   BUN 24 05/27/2015 1645   CREATININE 0.73 05/27/2015 1645   CREATININE 0.56* 04/30/2015 0549   CALCIUM 8.9 05/27/2015 1645   GFRNONAA >60 04/30/2015 0549   GFRAA >60 04/30/2015 0549    Lab Results  Component Value Date   WBC 9.6 05/27/2015   HGB 12.1* 05/27/2015   HCT 37.6* 05/27/2015   MCV 93.1 05/27/2015   PLT 237 05/27/2015          Assessment & Plan:  Pseudomonas prosthetic joint infection = currently On day 28 of 42 of ceftaz. Last day is 1/3. Today's labs reveal that he is doing well with abtx, inflammatory markers are nearly normalized Will check sed rate, crp, cbc and bmp today  Plan to recheck sed rate and crp on 1/2 through home health to decide if need to continue IV abtx.   If normalized, will pull picc line on 06/11/2015  rtc in 4 wk.

## 2015-05-28 LAB — SEDIMENTATION RATE: Sed Rate: 28 mm/hr — ABNORMAL HIGH (ref 0–20)

## 2015-05-29 DIAGNOSIS — R338 Other retention of urine: Secondary | ICD-10-CM | POA: Diagnosis not present

## 2015-05-29 DIAGNOSIS — Z86718 Personal history of other venous thrombosis and embolism: Secondary | ICD-10-CM | POA: Diagnosis not present

## 2015-05-29 DIAGNOSIS — I10 Essential (primary) hypertension: Secondary | ICD-10-CM | POA: Diagnosis not present

## 2015-05-29 DIAGNOSIS — M00861 Arthritis due to other bacteria, right knee: Secondary | ICD-10-CM | POA: Diagnosis not present

## 2015-05-29 DIAGNOSIS — C61 Malignant neoplasm of prostate: Secondary | ICD-10-CM | POA: Diagnosis not present

## 2015-05-30 DIAGNOSIS — R338 Other retention of urine: Secondary | ICD-10-CM | POA: Diagnosis not present

## 2015-06-08 DIAGNOSIS — E0590 Thyrotoxicosis, unspecified without thyrotoxic crisis or storm: Secondary | ICD-10-CM | POA: Diagnosis not present

## 2015-06-08 DIAGNOSIS — M6281 Muscle weakness (generalized): Secondary | ICD-10-CM | POA: Diagnosis not present

## 2015-06-08 DIAGNOSIS — R339 Retention of urine, unspecified: Secondary | ICD-10-CM | POA: Diagnosis not present

## 2015-06-08 DIAGNOSIS — B372 Candidiasis of skin and nail: Secondary | ICD-10-CM | POA: Diagnosis not present

## 2015-06-08 DIAGNOSIS — E785 Hyperlipidemia, unspecified: Secondary | ICD-10-CM | POA: Diagnosis not present

## 2015-06-08 DIAGNOSIS — I82403 Acute embolism and thrombosis of unspecified deep veins of lower extremity, bilateral: Secondary | ICD-10-CM | POA: Diagnosis not present

## 2015-06-08 DIAGNOSIS — C61 Malignant neoplasm of prostate: Secondary | ICD-10-CM | POA: Diagnosis not present

## 2015-06-08 DIAGNOSIS — I2510 Atherosclerotic heart disease of native coronary artery without angina pectoris: Secondary | ICD-10-CM | POA: Diagnosis not present

## 2015-06-08 DIAGNOSIS — Z8546 Personal history of malignant neoplasm of prostate: Secondary | ICD-10-CM | POA: Diagnosis not present

## 2015-06-08 DIAGNOSIS — R05 Cough: Secondary | ICD-10-CM | POA: Diagnosis not present

## 2015-06-08 DIAGNOSIS — M00861 Arthritis due to other bacteria, right knee: Secondary | ICD-10-CM | POA: Diagnosis not present

## 2015-06-08 DIAGNOSIS — R338 Other retention of urine: Secondary | ICD-10-CM | POA: Diagnosis not present

## 2015-06-08 DIAGNOSIS — Z86718 Personal history of other venous thrombosis and embolism: Secondary | ICD-10-CM | POA: Diagnosis not present

## 2015-06-08 DIAGNOSIS — I10 Essential (primary) hypertension: Secondary | ICD-10-CM | POA: Diagnosis not present

## 2015-06-08 DIAGNOSIS — R262 Difficulty in walking, not elsewhere classified: Secondary | ICD-10-CM | POA: Diagnosis not present

## 2015-06-10 DIAGNOSIS — R338 Other retention of urine: Secondary | ICD-10-CM | POA: Diagnosis not present

## 2015-06-11 DIAGNOSIS — M00861 Arthritis due to other bacteria, right knee: Secondary | ICD-10-CM | POA: Diagnosis not present

## 2015-06-11 DIAGNOSIS — Z86718 Personal history of other venous thrombosis and embolism: Secondary | ICD-10-CM | POA: Diagnosis not present

## 2015-06-11 DIAGNOSIS — C61 Malignant neoplasm of prostate: Secondary | ICD-10-CM | POA: Diagnosis not present

## 2015-06-11 DIAGNOSIS — I10 Essential (primary) hypertension: Secondary | ICD-10-CM | POA: Diagnosis not present

## 2015-06-13 DIAGNOSIS — I110 Hypertensive heart disease with heart failure: Secondary | ICD-10-CM | POA: Diagnosis not present

## 2015-06-13 DIAGNOSIS — R339 Retention of urine, unspecified: Secondary | ICD-10-CM | POA: Diagnosis not present

## 2015-06-13 DIAGNOSIS — I82431 Acute embolism and thrombosis of right popliteal vein: Secondary | ICD-10-CM | POA: Diagnosis not present

## 2015-06-13 DIAGNOSIS — Z8546 Personal history of malignant neoplasm of prostate: Secondary | ICD-10-CM | POA: Diagnosis not present

## 2015-06-13 DIAGNOSIS — E785 Hyperlipidemia, unspecified: Secondary | ICD-10-CM | POA: Diagnosis not present

## 2015-06-13 DIAGNOSIS — Z7982 Long term (current) use of aspirin: Secondary | ICD-10-CM | POA: Diagnosis not present

## 2015-06-13 DIAGNOSIS — I5022 Chronic systolic (congestive) heart failure: Secondary | ICD-10-CM | POA: Diagnosis not present

## 2015-06-13 DIAGNOSIS — Z466 Encounter for fitting and adjustment of urinary device: Secondary | ICD-10-CM | POA: Diagnosis not present

## 2015-06-13 DIAGNOSIS — Z7901 Long term (current) use of anticoagulants: Secondary | ICD-10-CM | POA: Diagnosis not present

## 2015-06-13 DIAGNOSIS — I2510 Atherosclerotic heart disease of native coronary artery without angina pectoris: Secondary | ICD-10-CM | POA: Diagnosis not present

## 2015-06-13 DIAGNOSIS — T8453XD Infection and inflammatory reaction due to internal right knee prosthesis, subsequent encounter: Secondary | ICD-10-CM | POA: Diagnosis not present

## 2015-06-13 DIAGNOSIS — M6281 Muscle weakness (generalized): Secondary | ICD-10-CM | POA: Diagnosis not present

## 2015-06-13 DIAGNOSIS — Z96651 Presence of right artificial knee joint: Secondary | ICD-10-CM | POA: Diagnosis not present

## 2015-06-13 DIAGNOSIS — E0590 Thyrotoxicosis, unspecified without thyrotoxic crisis or storm: Secondary | ICD-10-CM | POA: Diagnosis not present

## 2015-06-17 DIAGNOSIS — R339 Retention of urine, unspecified: Secondary | ICD-10-CM | POA: Diagnosis not present

## 2015-06-17 DIAGNOSIS — Z466 Encounter for fitting and adjustment of urinary device: Secondary | ICD-10-CM | POA: Diagnosis not present

## 2015-06-17 DIAGNOSIS — M6281 Muscle weakness (generalized): Secondary | ICD-10-CM | POA: Diagnosis not present

## 2015-06-17 DIAGNOSIS — Z96651 Presence of right artificial knee joint: Secondary | ICD-10-CM | POA: Diagnosis not present

## 2015-06-17 DIAGNOSIS — T8453XD Infection and inflammatory reaction due to internal right knee prosthesis, subsequent encounter: Secondary | ICD-10-CM | POA: Diagnosis not present

## 2015-06-17 DIAGNOSIS — I82431 Acute embolism and thrombosis of right popliteal vein: Secondary | ICD-10-CM | POA: Diagnosis not present

## 2015-06-20 DIAGNOSIS — T8453XD Infection and inflammatory reaction due to internal right knee prosthesis, subsequent encounter: Secondary | ICD-10-CM | POA: Diagnosis not present

## 2015-06-20 DIAGNOSIS — Z466 Encounter for fitting and adjustment of urinary device: Secondary | ICD-10-CM | POA: Diagnosis not present

## 2015-06-20 DIAGNOSIS — R339 Retention of urine, unspecified: Secondary | ICD-10-CM | POA: Diagnosis not present

## 2015-06-20 DIAGNOSIS — M6281 Muscle weakness (generalized): Secondary | ICD-10-CM | POA: Diagnosis not present

## 2015-06-20 DIAGNOSIS — Z96651 Presence of right artificial knee joint: Secondary | ICD-10-CM | POA: Diagnosis not present

## 2015-06-20 DIAGNOSIS — I82431 Acute embolism and thrombosis of right popliteal vein: Secondary | ICD-10-CM | POA: Diagnosis not present

## 2015-06-23 DIAGNOSIS — T45515A Adverse effect of anticoagulants, initial encounter: Secondary | ICD-10-CM | POA: Diagnosis not present

## 2015-06-23 DIAGNOSIS — R55 Syncope and collapse: Secondary | ICD-10-CM | POA: Diagnosis not present

## 2015-06-23 DIAGNOSIS — T45511A Poisoning by anticoagulants, accidental (unintentional), initial encounter: Secondary | ICD-10-CM | POA: Diagnosis not present

## 2015-06-23 DIAGNOSIS — N3001 Acute cystitis with hematuria: Secondary | ICD-10-CM | POA: Diagnosis not present

## 2015-06-23 DIAGNOSIS — Z79899 Other long term (current) drug therapy: Secondary | ICD-10-CM | POA: Diagnosis not present

## 2015-06-23 DIAGNOSIS — E7800 Pure hypercholesterolemia, unspecified: Secondary | ICD-10-CM | POA: Diagnosis not present

## 2015-06-23 DIAGNOSIS — S0083XA Contusion of other part of head, initial encounter: Secondary | ICD-10-CM | POA: Diagnosis not present

## 2015-06-23 DIAGNOSIS — Z7901 Long term (current) use of anticoagulants: Secondary | ICD-10-CM | POA: Diagnosis not present

## 2015-06-23 DIAGNOSIS — I10 Essential (primary) hypertension: Secondary | ICD-10-CM | POA: Diagnosis not present

## 2015-06-24 DIAGNOSIS — M009 Pyogenic arthritis, unspecified: Secondary | ICD-10-CM | POA: Diagnosis not present

## 2015-06-24 DIAGNOSIS — I824Z1 Acute embolism and thrombosis of unspecified deep veins of right distal lower extremity: Secondary | ICD-10-CM | POA: Diagnosis not present

## 2015-06-24 DIAGNOSIS — N3001 Acute cystitis with hematuria: Secondary | ICD-10-CM | POA: Diagnosis not present

## 2015-06-24 DIAGNOSIS — I10 Essential (primary) hypertension: Secondary | ICD-10-CM | POA: Diagnosis not present

## 2015-06-24 DIAGNOSIS — Z7901 Long term (current) use of anticoagulants: Secondary | ICD-10-CM | POA: Diagnosis not present

## 2015-06-24 DIAGNOSIS — I824Y1 Acute embolism and thrombosis of unspecified deep veins of right proximal lower extremity: Secondary | ICD-10-CM | POA: Diagnosis not present

## 2015-06-25 DIAGNOSIS — Z466 Encounter for fitting and adjustment of urinary device: Secondary | ICD-10-CM | POA: Diagnosis not present

## 2015-06-25 DIAGNOSIS — M6281 Muscle weakness (generalized): Secondary | ICD-10-CM | POA: Diagnosis not present

## 2015-06-25 DIAGNOSIS — I82431 Acute embolism and thrombosis of right popliteal vein: Secondary | ICD-10-CM | POA: Diagnosis not present

## 2015-06-25 DIAGNOSIS — R339 Retention of urine, unspecified: Secondary | ICD-10-CM | POA: Diagnosis not present

## 2015-06-25 DIAGNOSIS — T8453XD Infection and inflammatory reaction due to internal right knee prosthesis, subsequent encounter: Secondary | ICD-10-CM | POA: Diagnosis not present

## 2015-06-25 DIAGNOSIS — Z96651 Presence of right artificial knee joint: Secondary | ICD-10-CM | POA: Diagnosis not present

## 2015-06-27 DIAGNOSIS — T8453XD Infection and inflammatory reaction due to internal right knee prosthesis, subsequent encounter: Secondary | ICD-10-CM | POA: Diagnosis not present

## 2015-06-27 DIAGNOSIS — Z466 Encounter for fitting and adjustment of urinary device: Secondary | ICD-10-CM | POA: Diagnosis not present

## 2015-06-27 DIAGNOSIS — R339 Retention of urine, unspecified: Secondary | ICD-10-CM | POA: Diagnosis not present

## 2015-06-27 DIAGNOSIS — I82431 Acute embolism and thrombosis of right popliteal vein: Secondary | ICD-10-CM | POA: Diagnosis not present

## 2015-06-27 DIAGNOSIS — Z96651 Presence of right artificial knee joint: Secondary | ICD-10-CM | POA: Diagnosis not present

## 2015-06-27 DIAGNOSIS — M6281 Muscle weakness (generalized): Secondary | ICD-10-CM | POA: Diagnosis not present

## 2015-06-30 DIAGNOSIS — T8453XD Infection and inflammatory reaction due to internal right knee prosthesis, subsequent encounter: Secondary | ICD-10-CM | POA: Diagnosis not present

## 2015-06-30 DIAGNOSIS — Z466 Encounter for fitting and adjustment of urinary device: Secondary | ICD-10-CM | POA: Diagnosis not present

## 2015-06-30 DIAGNOSIS — I82431 Acute embolism and thrombosis of right popliteal vein: Secondary | ICD-10-CM | POA: Diagnosis not present

## 2015-06-30 DIAGNOSIS — R339 Retention of urine, unspecified: Secondary | ICD-10-CM | POA: Diagnosis not present

## 2015-06-30 DIAGNOSIS — M6281 Muscle weakness (generalized): Secondary | ICD-10-CM | POA: Diagnosis not present

## 2015-06-30 DIAGNOSIS — Z96651 Presence of right artificial knee joint: Secondary | ICD-10-CM | POA: Diagnosis not present

## 2015-07-01 DIAGNOSIS — T8453XD Infection and inflammatory reaction due to internal right knee prosthesis, subsequent encounter: Secondary | ICD-10-CM | POA: Diagnosis not present

## 2015-07-01 DIAGNOSIS — Z466 Encounter for fitting and adjustment of urinary device: Secondary | ICD-10-CM | POA: Diagnosis not present

## 2015-07-01 DIAGNOSIS — M6281 Muscle weakness (generalized): Secondary | ICD-10-CM | POA: Diagnosis not present

## 2015-07-01 DIAGNOSIS — R339 Retention of urine, unspecified: Secondary | ICD-10-CM | POA: Diagnosis not present

## 2015-07-01 DIAGNOSIS — I82431 Acute embolism and thrombosis of right popliteal vein: Secondary | ICD-10-CM | POA: Diagnosis not present

## 2015-07-01 DIAGNOSIS — Z96651 Presence of right artificial knee joint: Secondary | ICD-10-CM | POA: Diagnosis not present

## 2015-07-02 DIAGNOSIS — R338 Other retention of urine: Secondary | ICD-10-CM | POA: Diagnosis not present

## 2015-07-03 ENCOUNTER — Encounter: Payer: Self-pay | Admitting: Internal Medicine

## 2015-07-03 ENCOUNTER — Ambulatory Visit (INDEPENDENT_AMBULATORY_CARE_PROVIDER_SITE_OTHER): Payer: Medicare Other | Admitting: Internal Medicine

## 2015-07-03 VITALS — Temp 97.5°F | Ht 72.0 in | Wt 199.5 lb

## 2015-07-03 DIAGNOSIS — Z466 Encounter for fitting and adjustment of urinary device: Secondary | ICD-10-CM | POA: Diagnosis not present

## 2015-07-03 DIAGNOSIS — T8453XD Infection and inflammatory reaction due to internal right knee prosthesis, subsequent encounter: Secondary | ICD-10-CM | POA: Diagnosis not present

## 2015-07-03 DIAGNOSIS — B965 Pseudomonas (aeruginosa) (mallei) (pseudomallei) as the cause of diseases classified elsewhere: Secondary | ICD-10-CM

## 2015-07-03 DIAGNOSIS — Z96651 Presence of right artificial knee joint: Secondary | ICD-10-CM | POA: Diagnosis not present

## 2015-07-03 DIAGNOSIS — I82431 Acute embolism and thrombosis of right popliteal vein: Secondary | ICD-10-CM | POA: Diagnosis not present

## 2015-07-03 DIAGNOSIS — T8450XS Infection and inflammatory reaction due to unspecified internal joint prosthesis, sequela: Secondary | ICD-10-CM

## 2015-07-03 DIAGNOSIS — M6281 Muscle weakness (generalized): Secondary | ICD-10-CM | POA: Diagnosis not present

## 2015-07-03 DIAGNOSIS — A498 Other bacterial infections of unspecified site: Secondary | ICD-10-CM

## 2015-07-03 DIAGNOSIS — R339 Retention of urine, unspecified: Secondary | ICD-10-CM | POA: Diagnosis not present

## 2015-07-03 LAB — CBC WITH DIFFERENTIAL/PLATELET
BASOS ABS: 0 10*3/uL (ref 0.0–0.1)
BASOS PCT: 0 % (ref 0–1)
Eosinophils Absolute: 1 10*3/uL — ABNORMAL HIGH (ref 0.0–0.7)
Eosinophils Relative: 10 % — ABNORMAL HIGH (ref 0–5)
HEMATOCRIT: 36.4 % — AB (ref 39.0–52.0)
HEMOGLOBIN: 11.9 g/dL — AB (ref 13.0–17.0)
LYMPHS PCT: 19 % (ref 12–46)
Lymphs Abs: 1.8 10*3/uL (ref 0.7–4.0)
MCH: 30.3 pg (ref 26.0–34.0)
MCHC: 32.7 g/dL (ref 30.0–36.0)
MCV: 92.6 fL (ref 78.0–100.0)
MONO ABS: 1 10*3/uL (ref 0.1–1.0)
MPV: 9.8 fL (ref 8.6–12.4)
Monocytes Relative: 10 % (ref 3–12)
NEUTROS ABS: 5.9 10*3/uL (ref 1.7–7.7)
NEUTROS PCT: 61 % (ref 43–77)
Platelets: 229 10*3/uL (ref 150–400)
RBC: 3.93 MIL/uL — AB (ref 4.22–5.81)
RDW: 13.7 % (ref 11.5–15.5)
WBC: 9.7 10*3/uL (ref 4.0–10.5)

## 2015-07-03 LAB — BASIC METABOLIC PANEL
BUN: 20 mg/dL (ref 7–25)
CALCIUM: 8.9 mg/dL (ref 8.6–10.3)
CO2: 25 mmol/L (ref 20–31)
Chloride: 100 mmol/L (ref 98–110)
Creat: 0.81 mg/dL (ref 0.70–1.11)
GLUCOSE: 76 mg/dL (ref 65–99)
POTASSIUM: 4.6 mmol/L (ref 3.5–5.3)
SODIUM: 135 mmol/L (ref 135–146)

## 2015-07-03 LAB — C-REACTIVE PROTEIN: CRP: 1.3 mg/dL — AB (ref <0.60)

## 2015-07-03 LAB — SEDIMENTATION RATE: SED RATE: 44 mm/h — AB (ref 0–20)

## 2015-07-03 MED ORDER — CIPROFLOXACIN HCL 500 MG PO TABS: 500.0000 mg | ORAL_TABLET | Freq: Two times a day (BID) | ORAL | Status: DC

## 2015-07-03 NOTE — Progress Notes (Signed)
Rfv: follow up for PJI Subjective:    Patient ID: Anthony Osborn, male    DOB: May 31, 1920, 82 y.o.   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 He finished his IV abtx on 1/4 but has not started back on oral abtx until 1/16 when he was started on oral cipro for uti which he finished today. He is doing well. Still notices some warmth to his right knee. He continues to do physical therapy. Denies any fevers, chill, nightsweats. Currently no swelling to right knee. He has tolerated taking cipro without difficulty. He did have a recent fall where he had a laceration to right forehead though unclear if he thought it was related to dizziness due to abtx  No Known Allergies Current Outpatient Prescriptions on File Prior to Visit  Medication Sig Dispense Refill  . acetaminophen (TYLENOL) 325 MG tablet Take 2 tablets (650 mg total) by mouth every 6 (six) hours as needed for mild pain (or Fever >/= 101).    . CALCIUM-MAGNESIUM-ZINC PO Take 1 tablet by mouth 3 (three) times daily.     . carvedilol (COREG) 3.125 MG tablet Take 3.125 mg by mouth 2 (two) times daily.    . Cholecalciferol (VITAMIN D-3 PO) Take 1,000 Units by mouth daily.      . methimazole (TAPAZOLE) 10 MG tablet Take 5 mg by mouth daily.    . rivaroxaban (XARELTO) 20 MG TABS tablet Take 1 tablet (20 mg total) by mouth daily with supper. First dose on Wed 05/21/15 at 1700    . senna-docusate (SENOKOT-S) 8.6-50 MG tablet Take 1 tablet by mouth 2 (two) times daily.    Marland Kitchen aspirin 81 MG tablet Take 81 mg by mouth daily. Reported on 07/03/2015    . lisinopril-hydrochlorothiazide (PRINZIDE,ZESTORETIC) 10-12.5 MG per tablet Take 1 tablet by mouth daily. Reported on 07/03/2015    . simvastatin (ZOCOR) 10 MG tablet Take 10 mg by mouth at bedtime.       No current facility-administered medications on file prior to visit.    Active Ambulatory Problems    Diagnosis Date Noted  . Hyperlipidemia   . Prostate cancer ([place])   . CAD (coronary artery disease) 04/21/2015  . Septic arthritis of knee, right ([place]) 04/21/2015  . Cough 04/21/2015  . Hyperthyroidism   . Pyogenic arthritis of right knee joint ([place])   . Essential hypertension   . Encounter for palliative care   . DVT (deep venous thrombosis) ([place]) 04/27/2015  . Pseudomonas infection    Resolved Ambulatory Problems    Diagnosis Date Noted  . No Resolved Ambulatory Problems   Past Medical History  Diagnosis Date  . Hypertension    Social History  Substance Use Topics  . Smoking status: Never Smoker   . Smokeless tobacco: None  . Alcohol Use: No  family history includes Diabetes in his sister; Heart disease in his brother; Stroke in his father.  Review of Systems Positive pertinents listed in hpi    Objective:   Physical Exam  Temp(Src) 97.5 F (36.4 C) (Oral)  Ht 6' (1.829 m)  Wt 199 lb 8 oz (90.493 kg)  BMI 27.05 kg/m2 gen = a xo by 3 in nad Skin = left knee warm to touch, no effusion or erythema  Lab Results  Component Value Date   ESRSEDRATE 44* 07/03/2015   Lab Results  Component Value Date   CRP 1.3* 07/03/2015       Assessment & Plan:  Pseudomonal prosthetic joint infection Will place him on cipro for remaining 4 1/2 months to finish 6 months of treatment. I am concerned giving FQ to a nonagenarian. He doesn't have any oral abtx alternatives for pji treatment. Will need to re-evaluate if need to extend further. Will check sed rate and crp at this time. i have also given him precautions regarding the use of cipro in the elderly. If he is to have altered mental status, dizziness, or diarrhea. He is to contact us.

## 2015-07-07 DIAGNOSIS — N3020 Other chronic cystitis without hematuria: Secondary | ICD-10-CM | POA: Diagnosis not present

## 2015-07-07 DIAGNOSIS — R351 Nocturia: Secondary | ICD-10-CM | POA: Diagnosis not present

## 2015-07-07 DIAGNOSIS — R338 Other retention of urine: Secondary | ICD-10-CM | POA: Diagnosis not present

## 2015-07-07 DIAGNOSIS — Z0000 Encounter for general adult medical examination without abnormal findings: Secondary | ICD-10-CM | POA: Diagnosis not present

## 2015-07-07 DIAGNOSIS — C61 Malignant neoplasm of prostate: Secondary | ICD-10-CM | POA: Diagnosis not present

## 2015-07-08 DIAGNOSIS — Z96651 Presence of right artificial knee joint: Secondary | ICD-10-CM | POA: Diagnosis not present

## 2015-07-08 DIAGNOSIS — Z466 Encounter for fitting and adjustment of urinary device: Secondary | ICD-10-CM | POA: Diagnosis not present

## 2015-07-08 DIAGNOSIS — M6281 Muscle weakness (generalized): Secondary | ICD-10-CM | POA: Diagnosis not present

## 2015-07-08 DIAGNOSIS — R339 Retention of urine, unspecified: Secondary | ICD-10-CM | POA: Diagnosis not present

## 2015-07-08 DIAGNOSIS — T8453XD Infection and inflammatory reaction due to internal right knee prosthesis, subsequent encounter: Secondary | ICD-10-CM | POA: Diagnosis not present

## 2015-07-08 DIAGNOSIS — I82431 Acute embolism and thrombosis of right popliteal vein: Secondary | ICD-10-CM | POA: Diagnosis not present

## 2015-07-10 DIAGNOSIS — Z96651 Presence of right artificial knee joint: Secondary | ICD-10-CM | POA: Diagnosis not present

## 2015-07-10 DIAGNOSIS — I82431 Acute embolism and thrombosis of right popliteal vein: Secondary | ICD-10-CM | POA: Diagnosis not present

## 2015-07-10 DIAGNOSIS — Z466 Encounter for fitting and adjustment of urinary device: Secondary | ICD-10-CM | POA: Diagnosis not present

## 2015-07-10 DIAGNOSIS — M6281 Muscle weakness (generalized): Secondary | ICD-10-CM | POA: Diagnosis not present

## 2015-07-10 DIAGNOSIS — T8453XD Infection and inflammatory reaction due to internal right knee prosthesis, subsequent encounter: Secondary | ICD-10-CM | POA: Diagnosis not present

## 2015-07-10 DIAGNOSIS — R339 Retention of urine, unspecified: Secondary | ICD-10-CM | POA: Diagnosis not present

## 2015-07-17 DIAGNOSIS — Z96651 Presence of right artificial knee joint: Secondary | ICD-10-CM | POA: Diagnosis not present

## 2015-07-17 DIAGNOSIS — T8453XD Infection and inflammatory reaction due to internal right knee prosthesis, subsequent encounter: Secondary | ICD-10-CM | POA: Diagnosis not present

## 2015-07-17 DIAGNOSIS — M6281 Muscle weakness (generalized): Secondary | ICD-10-CM | POA: Diagnosis not present

## 2015-07-17 DIAGNOSIS — I82431 Acute embolism and thrombosis of right popliteal vein: Secondary | ICD-10-CM | POA: Diagnosis not present

## 2015-07-17 DIAGNOSIS — Z466 Encounter for fitting and adjustment of urinary device: Secondary | ICD-10-CM | POA: Diagnosis not present

## 2015-07-17 DIAGNOSIS — R339 Retention of urine, unspecified: Secondary | ICD-10-CM | POA: Diagnosis not present

## 2015-07-21 ENCOUNTER — Telehealth: Payer: Self-pay | Admitting: *Deleted

## 2015-07-21 NOTE — Telephone Encounter (Deleted)
Patient wife called and advised the Cipro the patient was given was stopped last week by his PCP and he was put on Ampicillin. She is concerned and wants to know when he is to restart the Cipro or even if he should. She is not sure why they stopped it just that they did. Advised her will give the doctor a call and call her back once she responds.

## 2015-07-21 NOTE — Telephone Encounter (Signed)
Patient wife called back and advised the PCP office called and told them to restart the Cipro.

## 2015-07-23 DIAGNOSIS — T8453XD Infection and inflammatory reaction due to internal right knee prosthesis, subsequent encounter: Secondary | ICD-10-CM | POA: Diagnosis not present

## 2015-07-23 DIAGNOSIS — Z96651 Presence of right artificial knee joint: Secondary | ICD-10-CM | POA: Diagnosis not present

## 2015-07-23 DIAGNOSIS — I82431 Acute embolism and thrombosis of right popliteal vein: Secondary | ICD-10-CM | POA: Diagnosis not present

## 2015-07-23 DIAGNOSIS — R339 Retention of urine, unspecified: Secondary | ICD-10-CM | POA: Diagnosis not present

## 2015-07-23 DIAGNOSIS — Z466 Encounter for fitting and adjustment of urinary device: Secondary | ICD-10-CM | POA: Diagnosis not present

## 2015-07-23 DIAGNOSIS — M6281 Muscle weakness (generalized): Secondary | ICD-10-CM | POA: Diagnosis not present

## 2015-07-31 DIAGNOSIS — T8453XD Infection and inflammatory reaction due to internal right knee prosthesis, subsequent encounter: Secondary | ICD-10-CM | POA: Diagnosis not present

## 2015-07-31 DIAGNOSIS — I82431 Acute embolism and thrombosis of right popliteal vein: Secondary | ICD-10-CM | POA: Diagnosis not present

## 2015-07-31 DIAGNOSIS — Z466 Encounter for fitting and adjustment of urinary device: Secondary | ICD-10-CM | POA: Diagnosis not present

## 2015-07-31 DIAGNOSIS — M6281 Muscle weakness (generalized): Secondary | ICD-10-CM | POA: Diagnosis not present

## 2015-07-31 DIAGNOSIS — R339 Retention of urine, unspecified: Secondary | ICD-10-CM | POA: Diagnosis not present

## 2015-07-31 DIAGNOSIS — Z96651 Presence of right artificial knee joint: Secondary | ICD-10-CM | POA: Diagnosis not present

## 2015-08-02 DIAGNOSIS — R51 Headache: Secondary | ICD-10-CM | POA: Diagnosis not present

## 2015-08-02 DIAGNOSIS — I10 Essential (primary) hypertension: Secondary | ICD-10-CM | POA: Diagnosis not present

## 2015-08-02 DIAGNOSIS — R531 Weakness: Secondary | ICD-10-CM | POA: Diagnosis not present

## 2015-08-14 DIAGNOSIS — H353211 Exudative age-related macular degeneration, right eye, with active choroidal neovascularization: Secondary | ICD-10-CM | POA: Diagnosis not present

## 2015-08-19 ENCOUNTER — Ambulatory Visit (INDEPENDENT_AMBULATORY_CARE_PROVIDER_SITE_OTHER): Payer: Medicare Other | Admitting: Internal Medicine

## 2015-08-19 ENCOUNTER — Encounter: Payer: Self-pay | Admitting: Internal Medicine

## 2015-08-19 VITALS — BP 144/72 | HR 78 | Temp 98.5°F | Wt 202.0 lb

## 2015-08-19 DIAGNOSIS — T8450XS Infection and inflammatory reaction due to unspecified internal joint prosthesis, sequela: Secondary | ICD-10-CM | POA: Diagnosis not present

## 2015-08-19 DIAGNOSIS — B965 Pseudomonas (aeruginosa) (mallei) (pseudomallei) as the cause of diseases classified elsewhere: Secondary | ICD-10-CM

## 2015-08-19 DIAGNOSIS — A498 Other bacterial infections of unspecified site: Secondary | ICD-10-CM

## 2015-08-19 LAB — CBC WITH DIFFERENTIAL/PLATELET
BASOS PCT: 0 % (ref 0–1)
Basophils Absolute: 0 10*3/uL (ref 0.0–0.1)
EOS ABS: 0.3 10*3/uL (ref 0.0–0.7)
Eosinophils Relative: 4 % (ref 0–5)
HCT: 42.6 % (ref 39.0–52.0)
Hemoglobin: 13.5 g/dL (ref 13.0–17.0)
Lymphocytes Relative: 23 % (ref 12–46)
Lymphs Abs: 2 10*3/uL (ref 0.7–4.0)
MCH: 29.5 pg (ref 26.0–34.0)
MCHC: 31.7 g/dL (ref 30.0–36.0)
MCV: 93 fL (ref 78.0–100.0)
MONOS PCT: 9 % (ref 3–12)
MPV: 11.5 fL (ref 8.6–12.4)
Monocytes Absolute: 0.8 10*3/uL (ref 0.1–1.0)
NEUTROS PCT: 64 % (ref 43–77)
Neutro Abs: 5.5 10*3/uL (ref 1.7–7.7)
PLATELETS: 180 10*3/uL (ref 150–400)
RBC: 4.58 MIL/uL (ref 4.22–5.81)
RDW: 14 % (ref 11.5–15.5)
WBC: 8.6 10*3/uL (ref 4.0–10.5)

## 2015-08-19 LAB — BASIC METABOLIC PANEL
BUN: 17 mg/dL (ref 7–25)
CALCIUM: 9.6 mg/dL (ref 8.6–10.3)
CO2: 28 mmol/L (ref 20–31)
CREATININE: 0.66 mg/dL — AB (ref 0.70–1.11)
Chloride: 102 mmol/L (ref 98–110)
Glucose, Bld: 77 mg/dL (ref 65–99)
Potassium: 4.4 mmol/L (ref 3.5–5.3)
Sodium: 139 mmol/L (ref 135–146)

## 2015-08-19 LAB — C-REACTIVE PROTEIN

## 2015-08-19 NOTE — Progress Notes (Signed)
psa pji s/p washout/plu s1 staged revision Subjective:    Patient ID: Anthony Osborn, male    DOB: November 16, 1919, 82 y.o.   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 He finished his IV abtx on 1/4 but has not started back on oral abtx until 1/16. He is doing well with cipro without any diarrhea or rash but occ. feels warm sensation to his head, not necessarily c/w dizziness. Still notices some warmth to his right knee. He continues to do physical therapy. Denies any fevers, chill, nightsweats. Currently no swelling to right knee. Continues to use walker. He has upcoming birthday at the end of the month, 82!  Current Outpatient Prescriptions on File Prior to Visit  Medication Sig Dispense Refill  . acetaminophen (TYLENOL) 325 MG tablet Take 2 tablets (650 mg total) by mouth every 6 (six) hours as needed for mild pain (or Fever >/= 101).    Marland Kitchen aspirin 81 MG tablet Take 81 mg by mouth daily. Reported on 07/03/2015    . CALCIUM-MAGNESIUM-ZINC PO Take 1 tablet by mouth 3 (three) times daily.     . carvedilol (COREG) 3.125 MG tablet Take 3.125 mg by mouth 2 (two) times daily.    . Cholecalciferol (VITAMIN D-3 PO) Take 1,000 Units by mouth daily.      . ciprofloxacin (CIPRO) 500 MG tablet Take 1 tablet (500 mg total) by mouth 2 (two) times daily. 60 tablet 4  . lisinopril-hydrochlorothiazide (PRINZIDE,ZESTORETIC) 10-12.5 MG per tablet Take 1 tablet by mouth daily. Reported on 07/03/2015    . methimazole (TAPAZOLE) 10 MG tablet Take 5 mg by mouth daily.    . rivaroxaban (XARELTO) 20 MG TABS tablet Take 1 tablet (20 mg total) by mouth daily with supper. First dose on Wed 05/21/15 at 1700    . senna-docusate (SENOKOT-S) 8.6-50 MG tablet Take 1 tablet by mouth 2 (two) times daily.    . simvastatin (ZOCOR) 10 MG tablet Take 10 mg by mouth at bedtime.      . tamsulosin (FLOMAX) 0.4  MG CAPS capsule Take 0.4 mg by mouth at bedtime.     No current facility-administered medications on file prior to visit.   Active Ambulatory Problems    Diagnosis Date Noted  . Hyperlipidemia   . Prostate cancer ([place])   . CAD (coronary artery disease) 04/21/2015  . Septic arthritis of knee, right ([place]) 04/21/2015  . Cough 04/21/2015  . Hyperthyroidism   . Pyogenic arthritis of right knee joint ([place])   . Essential hypertension   . Encounter for palliative care   . DVT (deep venous thrombosis) ([place]) 04/27/2015  . Pseudomonas infection    Resolved Ambulatory Problems    Diagnosis Date Noted  . No Resolved Ambulatory Problems   Past Medical History  Diagnosis Date  . Hypertension      Review of Systems Except + decrease hearing Constitutional: Negative for fever, chills, diaphoresis, activity change, appetite change, fatigue and unexpected weight change.  HENT: Negative for congestion, sore throat, rhinorrhea, sneezing, trouble swallowing and sinus pressure.  Eyes: Negative for photophobia and visual disturbance.  Respiratory: Negative for cough, chest tightness, shortness of breath, wheezing and stridor.  Cardiovascular: Negative for chest pain, palpitations and leg swelling.  Gastrointestinal: Negative for nausea, vomiting, abdominal pain, diarrhea, constipation, blood in stool, abdominal distention and anal bleeding.  Genitourinary: Negative  for dysuria, hematuria, flank pain and difficulty urinating.  Musculoskeletal: Negative for myalgias, back pain, joint swelling, arthralgias and gait problem.  Skin: Negative for color change, pallor, rash and wound.  Neurological: Negative for dizziness, tremors, weakness and light-headedness.  Hematological: Negative for adenopathy. Does not bruise/bleed easily.  Psychiatric/Behavioral: Negative for behavioral problems, confusion, sleep disturbance, dysphoric mood, decreased concentration and agitation.       Objective:   Physical  Exam  BP 144/72 mmHg  Pulse 78  Temp(Src) 98.5 F (36.9 C) (Oral)  Wt 202 lb (91.627 kg) Physical Exam  Constitutional: He is oriented to person, place, and time. He appears well-developed and well-nourished. No distress.  HENT:  Mouth/Throat: Oropharynx is clear and moist. No oropharyngeal exudate.  Skin: Skin is warm and dry. Well healed incision to right knee. Slight warmth to touch Psychiatric: He has a normal mood and affect. His behavior is normal.    Lab Results  Component Value Date   ESRSEDRATE 13 08/19/2015   Lab Results  Component Value Date   CRP <0.5 08/19/2015        Assessment & Plan:  Pseudomonas prosthetic joint infection = Continue to take cipro until 4/30.  Will check sed rate and crp today to see how he is responding,  Addendum: inflammatory markers are normalizing  htn = will allow for permissive HTN and don't want to cause any hypotension since he has hx of falls

## 2015-08-20 DIAGNOSIS — T8450XS Infection and inflammatory reaction due to unspecified internal joint prosthesis, sequela: Secondary | ICD-10-CM | POA: Diagnosis not present

## 2015-08-20 DIAGNOSIS — B965 Pseudomonas (aeruginosa) (mallei) (pseudomallei) as the cause of diseases classified elsewhere: Secondary | ICD-10-CM | POA: Diagnosis not present

## 2015-08-20 LAB — SEDIMENTATION RATE: Sed Rate: 13 mm/hr (ref 0–20)

## 2015-08-21 DIAGNOSIS — Z96651 Presence of right artificial knee joint: Secondary | ICD-10-CM | POA: Diagnosis not present

## 2015-08-21 DIAGNOSIS — Z471 Aftercare following joint replacement surgery: Secondary | ICD-10-CM | POA: Diagnosis not present

## 2015-09-09 DIAGNOSIS — N401 Enlarged prostate with lower urinary tract symptoms: Secondary | ICD-10-CM | POA: Diagnosis not present

## 2015-09-09 DIAGNOSIS — I824Z3 Acute embolism and thrombosis of unspecified deep veins of distal lower extremity, bilateral: Secondary | ICD-10-CM | POA: Diagnosis not present

## 2015-09-09 DIAGNOSIS — Z0000 Encounter for general adult medical examination without abnormal findings: Secondary | ICD-10-CM | POA: Diagnosis not present

## 2015-09-09 DIAGNOSIS — I25812 Atherosclerosis of bypass graft of coronary artery of transplanted heart without angina pectoris: Secondary | ICD-10-CM | POA: Diagnosis not present

## 2015-09-09 DIAGNOSIS — I10 Essential (primary) hypertension: Secondary | ICD-10-CM | POA: Diagnosis not present

## 2015-09-18 DIAGNOSIS — H353211 Exudative age-related macular degeneration, right eye, with active choroidal neovascularization: Secondary | ICD-10-CM | POA: Diagnosis not present

## 2015-09-22 ENCOUNTER — Encounter: Payer: Self-pay | Admitting: Cardiovascular Disease

## 2015-09-22 ENCOUNTER — Ambulatory Visit (HOSPITAL_COMMUNITY): Payer: Medicare Other | Attending: Cardiology

## 2015-09-22 ENCOUNTER — Other Ambulatory Visit: Payer: Self-pay | Admitting: Cardiovascular Disease

## 2015-09-22 ENCOUNTER — Other Ambulatory Visit: Payer: Self-pay

## 2015-09-22 ENCOUNTER — Ambulatory Visit (INDEPENDENT_AMBULATORY_CARE_PROVIDER_SITE_OTHER): Payer: Medicare Other | Admitting: Cardiovascular Disease

## 2015-09-22 VITALS — BP 140/88 | HR 88 | Ht 72.0 in | Wt 211.6 lb

## 2015-09-22 DIAGNOSIS — I429 Cardiomyopathy, unspecified: Secondary | ICD-10-CM

## 2015-09-22 DIAGNOSIS — I5023 Acute on chronic systolic (congestive) heart failure: Secondary | ICD-10-CM | POA: Diagnosis not present

## 2015-09-22 DIAGNOSIS — J90 Pleural effusion, not elsewhere classified: Secondary | ICD-10-CM | POA: Insufficient documentation

## 2015-09-22 DIAGNOSIS — I2510 Atherosclerotic heart disease of native coronary artery without angina pectoris: Secondary | ICD-10-CM | POA: Diagnosis not present

## 2015-09-22 DIAGNOSIS — I10 Essential (primary) hypertension: Secondary | ICD-10-CM | POA: Diagnosis not present

## 2015-09-22 DIAGNOSIS — Z951 Presence of aortocoronary bypass graft: Secondary | ICD-10-CM | POA: Insufficient documentation

## 2015-09-22 DIAGNOSIS — I119 Hypertensive heart disease without heart failure: Secondary | ICD-10-CM | POA: Insufficient documentation

## 2015-09-22 DIAGNOSIS — E785 Hyperlipidemia, unspecified: Secondary | ICD-10-CM | POA: Insufficient documentation

## 2015-09-22 LAB — ECHOCARDIOGRAM LIMITED
HEIGHTINCHES: 72 in
Weight: 3385.6 oz

## 2015-09-22 MED ORDER — FUROSEMIDE 40 MG PO TABS: 40.0000 mg | ORAL_TABLET | Freq: Every day | ORAL | Status: DC

## 2015-09-22 NOTE — Patient Instructions (Signed)
Medication Instructions:  Your physician has recommended you make the following change in your medication: Start furosemide 40 mg by mouth daily.   Labwork: none  Testing/Procedures: Your physician has requested that you have an echocardiogram. Echocardiography is a painless test that uses sound waves to create images of your heart. It provides your doctor with information about the size and shape of your heart and how well your heart's chambers and valves are working. This procedure takes approximately one hour. There are no restrictions for this procedure.    Follow-Up: Your physician recommends that you schedule a follow-up appointment in: 2-3 weeks. --Scheduled for May 5,2017 at 4:00   Any Other Special Instructions Will Be Listed Below (If Applicable).     If you need a refill on your cardiac medications before your next appointment, please call your pharmacy.

## 2015-09-22 NOTE — Progress Notes (Signed)
Chief Complaint  Patient presents with  . Follow-up    post hospital   History of Present Illness: 82 yo with past medical history of hypertension, hyperlipidemia, hypothyroidism, cardiomyopathy and CAD s/p triple-vessel CABG on 03/17/2011 who is here today for cardiac follow up. I met him in the hospital in November 2016 when he presented with septic R knee prosthesis. We were consulted for pre-op risk assessment. He underwent knee revision 04/29/16 per Dr. Ronnie Derby. His cardiac issues have been followed in the past by [facility] in [place]. Echo November 2016 with LVEF of 25-30% but records show that his LVEF in 2013 was normal. Also noted to have mild AI and mild MR on most recent echo. He is on Xarelto for DVT in LE November 2016. He has gained 20 lbs last 6 months.   He is here today for follow up. He has had no chest pain. He does note occasional dyspnea. He has LE edema. He has weight gain as noted above. He has had fatigue. No energy. No fevers, chills.   Primary Care Physician: Cyndy Freeze, MD  Past Medical History  Diagnosis Date  . Hypertension   . Hyperlipidemia   . Prostate cancer ([place])     3 YEARS AGO(BEING OBSERVED)  . Hyperthyroidism   . CAD (coronary artery disease)   . Ischemic cardiomyopathy     Past Surgical History  Procedure Laterality Date  . Bilateral knee arthroscopy    . Rotator cuff repair      LEFT  . Cholecystectomy    . Coronary artery bypass graft    . Incision and drainage Right 04/28/2015    Procedure: INCISION AND DRAINAGE;  Surgeon: Vickey Huger, MD;  Location: [facility];  Service: Orthopedics;  Laterality: Right;  . Total knee revision Right 04/28/2015    Procedure: TOTAL KNEE REVISION;  Surgeon: Vickey Huger, MD;  Location: [facility];  Service: Orthopedics;  Laterality: Right;    Current Outpatient Prescriptions  Medication Sig Dispense Refill  . acetaminophen (TYLENOL) 325 MG tablet Take 2 tablets (650 mg total) by mouth every 6 (six) hours as  needed for mild pain (or Fever >/= 101).    . CALCIUM-MAGNESIUM-ZINC PO Take 1 tablet by mouth 3 (three) times daily.     . carvedilol (COREG) 3.125 MG tablet Take 3.125 mg by mouth 2 (two) times daily.    . Cholecalciferol (VITAMIN D-3 PO) Take 1,000 Units by mouth daily.      . ciprofloxacin (CIPRO) 500 MG tablet Take 1 tablet (500 mg total) by mouth 2 (two) times daily. 60 tablet 4  . methimazole (TAPAZOLE) 10 MG tablet Take 5 mg by mouth every other day.     . rivaroxaban (XARELTO) 20 MG TABS tablet Take 1 tablet (20 mg total) by mouth daily with supper. First dose on Wed 05/21/15 at 1700    . senna-docusate (SENOKOT-S) 8.6-50 MG tablet Take 1 tablet by mouth 2 (two) times daily.    . simvastatin (ZOCOR) 10 MG tablet Take 10 mg by mouth at bedtime.      . tamsulosin (FLOMAX) 0.4 MG CAPS capsule Take 0.4 mg by mouth at bedtime.     No current facility-administered medications for this visit.    No Known Allergies  Social History   Social History  . Marital Status: Married    Spouse Name: N/A  . Number of Children: N/A  . Years of Education: N/A   Occupational History  . Not on file.  Social History Main Topics  . Smoking status: Never Smoker   . Smokeless tobacco: Not on file  . Alcohol Use: No  . Drug Use: Not on file  . Sexual Activity: Not on file   Other Topics Concern  . Not on file   Social History Narrative    Family History  Problem Relation Age of Onset  . Stroke Father   . Heart disease Brother   . Diabetes Sister     Review of Systems:  As stated in the HPI and otherwise negative.   BP 140/88 mmHg  Pulse 88  Ht 6' (1.829 m)  Wt 211 lb 9.6 oz (95.981 kg)  BMI 28.69 kg/m2  SpO2 97%  Physical Examination: General: Well developed, well nourished, NAD HEENT: OP clear, mucus membranes moist SKIN: warm, dry. No rashes. Neuro: No focal deficits Musculoskeletal: Muscle strength 5/5 all ext Psychiatric: Mood and affect normal Neck: No JVD, no  carotid bruits, no thyromegaly, no lymphadenopathy. Lungs:Clear bilaterally, no wheezes, rhonci, crackles Cardiovascular: Regular rate and rhythm. No murmurs, gallops or rubs. Abdomen:Soft. Bowel sounds present. Non-tender.  Extremities: No lower extremity edema. Pulses are 2 + in the bilateral DP/PT.  Echo 04/23/15: Left ventricle: The cavity size was normal. Wall thickness was  normal. Systolic function was severely reduced. The estimated  ejection fraction was in the range of 25% to 30%. Akinesis of the  anteroseptal myocardium. Features are consistent with a  pseudonormal left ventricular filling pattern, with concomitant  abnormal relaxation and increased filling pressure (grade 2  diastolic dysfunction). - Aortic valve: There was mild regurgitation. - Mitral valve: Mildly calcified annulus. There was mild  regurgitation. - Left atrium: The atrium was moderately dilated. - Pulmonary arteries: Systolic pressure was moderately increased.  PA peak pressure: 59 mm Hg (S).  EKG:  EKG is ordered today. The ekg ordered today demonstrates sinus, 1st degree AV block. PVC. Lateral T wave inversions.   Recent Labs: 04/22/2015: B Natriuretic Peptide 621.8*; TSH 2.759 04/28/2015: ALT 54 08/19/2015: BUN 17; Creat 0.66*; Hemoglobin 13.5; Platelets 180; Potassium 4.4; Sodium 139   Lipid Panel    Component Value Date/Time   CHOL 99 04/22/2015 0115   TRIG 89 04/22/2015 0115   HDL 22* 04/22/2015 0115   CHOLHDL 4.5 04/22/2015 0115   VLDL 18 04/22/2015 0115   LDLCALC 59 04/22/2015 0115     Wt Readings from Last 3 Encounters:  09/22/15 211 lb 9.6 oz (95.981 kg)  08/19/15 202 lb (91.627 kg)  07/03/15 199 lb 8 oz (90.493 kg)     Other studies Reviewed: Additional studies/ records that were reviewed today include: All records from outside cardiology reviewed including labs, office visits and echo report, old EKGs.   Assessment and Plan:   1. CAD: He is s/p CABG. He has had no  chest pain although he has some dyspnea. I suspect he has some volume overload and will treat with Lasix. I will continue Coreg and statin. He is not on an ASA since he is on Xarelto. Will repeat echo. If LVEF is still depressed, may need cardiac cath to exclude progression of CAD.   2. Ischemic Cardiomyopathy: New LV systolic dysfunction since 2013. LVEF is 25-30% by echo November 2016. Will repeat echo now to assess LVEF as last LVEF was in setting of sepsis. Will continue beta blocker. It is not clear why his Ace inh was stopped.   3. Acute on chronic systolic CHF: Will start Lasix 40 mg daily. Follow weight  daily.   4. HTN: BP is slightly elevated today. No changes.   Current medicines are reviewed at length with the patient today.  The patient does not have concerns regarding medicines.  The following changes have been made:  no change  Labs/ tests ordered today include:  No orders of the defined types were placed in this encounter.    Disposition:   FU with me in 2 weeks  Signed, Lauree Chandler, MD 09/22/2015 2:20 PM    [facility] [address], [place], [place]  [address] Phone: [phone]; Fax: [phone]

## 2015-09-23 ENCOUNTER — Ambulatory Visit
Admission: RE | Admit: 2015-09-23 | Discharge: 2015-09-23 | Disposition: A | Discharge status: Home / Self Care | Payer: Medicare Other | Source: Ambulatory Visit | Attending: Cardiovascular Disease | Admitting: Cardiovascular Disease

## 2015-09-23 ENCOUNTER — Other Ambulatory Visit: Payer: Self-pay

## 2015-09-23 DIAGNOSIS — R0602 Shortness of breath: Secondary | ICD-10-CM | POA: Diagnosis not present

## 2015-09-23 DIAGNOSIS — J90 Pleural effusion, not elsewhere classified: Secondary | ICD-10-CM

## 2015-09-24 ENCOUNTER — Telehealth: Payer: Self-pay | Admitting: Cardiovascular Disease

## 2015-09-24 NOTE — Telephone Encounter (Signed)
Spoke with Brook from Dr. Cyndy Freeze MD called to see if Dr. Angelena Form is going to treat pt for the pneumonia that was found on the xray that Dr. Angelena Form order when seen in this office on 09/22/15. Xray was done on 09/23/15. PCP would like to know ASAP.

## 2015-09-24 NOTE — Telephone Encounter (Signed)
Please ask the patient to get into primary care today for treatment of his pneumonia. Thanks, chris

## 2015-09-24 NOTE — Telephone Encounter (Signed)
New Message:    Dr Cathi Roan wants to know if Dr Angelena Form is going to treat the pt for the pneumonia that was found on the xray yesterday? Please her know asap please.

## 2015-09-24 NOTE — Telephone Encounter (Signed)
Brook at the PCP Dr. Cathi Roan is aware that Dr. Angelena Form recommends for pt to be treated for his pneumonia by his PCP. Brooks states PCP will treat pt for his pneumonia today ASAP.  Left pt a detail message about him needing to see his PCP today to be treated for his pneumonia. Pt to call back if needed.

## 2015-09-26 ENCOUNTER — Telehealth: Payer: Self-pay | Admitting: *Deleted

## 2015-09-26 NOTE — Telephone Encounter (Addendum)
Call from patient's RN stating that he now has pneumonia as shown on xray and their MD wanted to stop cipro temporarily. Dr. Baxter Flattery spoke to RN and changed his antibiotics to cover pneumonia and the infection she is treating him for. They will resume the cipro once the pneumonia treatment has ended. Called wife to inform and she stated PCP nurse called her and he is going to continue the cipro with the other two antibiotics. Dr. Baxter Flattery aware

## 2015-09-30 ENCOUNTER — Encounter: Payer: Self-pay | Admitting: Internal Medicine

## 2015-09-30 ENCOUNTER — Ambulatory Visit (INDEPENDENT_AMBULATORY_CARE_PROVIDER_SITE_OTHER): Payer: Medicare Other | Admitting: Internal Medicine

## 2015-09-30 VITALS — BP 125/73 | HR 73 | Temp 97.3°F | Wt 197.8 lb

## 2015-09-30 DIAGNOSIS — T8450XS Infection and inflammatory reaction due to unspecified internal joint prosthesis, sequela: Secondary | ICD-10-CM

## 2015-09-30 DIAGNOSIS — D72829 Elevated white blood cell count, unspecified: Secondary | ICD-10-CM | POA: Diagnosis not present

## 2015-09-30 DIAGNOSIS — I2510 Atherosclerotic heart disease of native coronary artery without angina pectoris: Secondary | ICD-10-CM

## 2015-09-30 NOTE — Progress Notes (Signed)
Rfv: prosthetic joint infection Subjective:    Patient ID: Anthony Osborn, male    DOB: 1919-07-12, 82 y.o.   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 He was seen by PCP last week when complained of shortness of breath, relieved with increasing "fluid pill". Denies having cough or fever, but treated based on cxr findings with ceftin and azithro. Currently on day 6 of 10 of cephalosporin. Feeling better.  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 He underwent  i x d, with revision arthroplasty on 11/21. He is roughly 5 months out from his surgery.  No Known Allergies Current Outpatient Prescriptions on File Prior to Visit  Medication Sig Dispense Refill  . acetaminophen (TYLENOL) 325 MG tablet Take 2 tablets (650 mg total) by mouth every 6 (six) hours as needed for mild pain (or Fever >/= 101).    . CALCIUM-MAGNESIUM-ZINC PO Take 1 tablet by mouth 3 (three) times daily.     . carvedilol (COREG) 3.125 MG tablet Take 3.125 mg by mouth 2 (two) times daily.    . Cholecalciferol (VITAMIN D-3 PO) Take 1,000 Units by mouth daily.      . ciprofloxacin (CIPRO) 500 MG tablet Take 1 tablet (500 mg total) by mouth 2 (two) times daily. 60 tablet 4  . furosemide (LASIX) 40 MG tablet Take 1 tablet (40 mg total) by mouth daily. 30 tablet 6  . methimazole (TAPAZOLE) 10 MG tablet Take 5 mg by mouth every other day.     . rivaroxaban (XARELTO) 20 MG TABS tablet Take 1 tablet (20 mg total) by mouth daily with supper. First dose on Wed 05/21/15 at 1700    . senna-docusate (SENOKOT-S) 8.6-50 MG tablet Take 1 tablet by mouth 2 (two) times daily.    . simvastatin (ZOCOR) 10 MG tablet Take 10 mg by mouth at bedtime.      . tamsulosin (FLOMAX) 0.4 MG CAPS capsule Take 0.4 mg by mouth at bedtime.     No current  facility-administered medications on file prior to visit.   Active Ambulatory Problems    Diagnosis Date Noted  . Hyperlipidemia   . Prostate cancer ([place])   . CAD (coronary artery disease) 04/21/2015  . Septic arthritis of knee, right ([place]) 04/21/2015  . Cough 04/21/2015  . Hyperthyroidism   . Pyogenic arthritis of right knee joint ([place])   . Essential hypertension   . Encounter for palliative care   . DVT (deep venous thrombosis) ([place]) 04/27/2015  . Pseudomonas infection    Resolved Ambulatory Problems    Diagnosis Date Noted  . No Resolved Ambulatory Problems   Past Medical History  Diagnosis Date  . Hypertension   . Ischemic cardiomyopathy      Review of Systems     Objective:   Physical Exam BP 125/73 mmHg  Pulse 73  Temp(Src) 97.3 F (36.3 C) (Oral)  Wt 197 lb 12 oz (89.699 kg) Physical Exam  Constitutional: He is oriented to person, place, and time. He appears well-developed and well-nourished. No distress.  HENT:  Mouth/Throat: Oropharynx is clear and moist. No oropharyngeal exudate.  Cardiovascular: Normal rate, regular rhythm and normal heart sounds. Exam reveals no gallop and no friction rub.  No murmur heard.  Pulmonary/Chest: Effort normal and breath sounds normal. No  respiratory distress. He has no wheezes.  Abdominal: Soft. Bowel sounds are normal. He exhibits no distension. There is no tenderness.  Lymphadenopathy:  He has no cervical adenopathy.  Ext: right knee well healed. Slight warmth no ertyhema, slight effusion Skin: Skin is warm and dry. No rash noted. No erythema.  Psychiatric: He has a normal mood and affect. His behavior is normal.   Labs: Lab Results  Component Value Date   ESRSEDRATE 9 09/30/2015   Lab Results  Component Value Date   CRP <0.5 09/30/2015          Assessment & Plan:  - will continue with 4 more weeks of cipro to complete 6 months of treatment.  - will check sed rate and crp. Labs results are reassuring for  treatment of his PsA PJI  - questionable pneumonia = sounds more like pulmonary edema rather than pneumonia. He is currently on day 6 of treatment for CAP. I mentioned he can likely stop these abtx (Ceftin and azithro)

## 2015-10-01 LAB — BASIC METABOLIC PANEL
BUN: 16 mg/dL (ref 7–25)
CHLORIDE: 100 mmol/L (ref 98–110)
CO2: 30 mmol/L (ref 20–31)
CREATININE: 0.84 mg/dL (ref 0.70–1.11)
Calcium: 9.2 mg/dL (ref 8.6–10.3)
GLUCOSE: 99 mg/dL (ref 65–99)
Potassium: 4.2 mmol/L (ref 3.5–5.3)
Sodium: 139 mmol/L (ref 135–146)

## 2015-10-01 LAB — CBC WITH DIFFERENTIAL/PLATELET
BASOS ABS: 0 {cells}/uL (ref 0–200)
Basophils Relative: 0 %
EOS ABS: 222 {cells}/uL (ref 15–500)
Eosinophils Relative: 3 %
HEMATOCRIT: 43.3 % (ref 38.5–50.0)
HEMOGLOBIN: 14 g/dL (ref 13.2–17.1)
LYMPHS ABS: 1924 {cells}/uL (ref 850–3900)
Lymphocytes Relative: 26 %
MCH: 29.9 pg (ref 27.0–33.0)
MCHC: 32.3 g/dL (ref 32.0–36.0)
MCV: 92.5 fL (ref 80.0–100.0)
MONO ABS: 814 {cells}/uL (ref 200–950)
MPV: 11.5 fL (ref 7.5–12.5)
Monocytes Relative: 11 %
NEUTROS PCT: 60 %
Neutro Abs: 4440 cells/uL (ref 1500–7800)
Platelets: 193 10*3/uL (ref 140–400)
RBC: 4.68 MIL/uL (ref 4.20–5.80)
RDW: 14.5 % (ref 11.0–15.0)
WBC: 7.4 10*3/uL (ref 3.8–10.8)

## 2015-10-01 LAB — C-REACTIVE PROTEIN: CRP: <0.5 mg/dL (ref <0.60)

## 2015-10-01 LAB — SEDIMENTATION RATE: Sed Rate: 9 mm/hr (ref 0–20)

## 2015-10-06 DIAGNOSIS — C61 Malignant neoplasm of prostate: Secondary | ICD-10-CM | POA: Diagnosis not present

## 2015-10-07 ENCOUNTER — Encounter: Payer: Self-pay | Admitting: *Deleted

## 2015-10-10 ENCOUNTER — Encounter: Payer: Self-pay | Admitting: Cardiovascular Disease

## 2015-10-10 ENCOUNTER — Ambulatory Visit (INDEPENDENT_AMBULATORY_CARE_PROVIDER_SITE_OTHER): Payer: Medicare Other | Admitting: Cardiovascular Disease

## 2015-10-10 VITALS — BP 140/78 | HR 79 | Ht 72.0 in | Wt 199.8 lb

## 2015-10-10 DIAGNOSIS — I5023 Acute on chronic systolic (congestive) heart failure: Secondary | ICD-10-CM | POA: Diagnosis not present

## 2015-10-10 DIAGNOSIS — I10 Essential (primary) hypertension: Secondary | ICD-10-CM

## 2015-10-10 DIAGNOSIS — I2510 Atherosclerotic heart disease of native coronary artery without angina pectoris: Secondary | ICD-10-CM

## 2015-10-10 DIAGNOSIS — I429 Cardiomyopathy, unspecified: Secondary | ICD-10-CM

## 2015-10-10 MED ORDER — LISINOPRIL 5 MG PO TABS: 5.0000 mg | ORAL_TABLET | Freq: Every day | ORAL | Status: DC

## 2015-10-10 NOTE — Progress Notes (Signed)
Chief Complaint  Patient presents with  . Follow-up   History of Present Illness: 82 yo with past medical history of hypertension, hyperlipidemia, hypothyroidism, cardiomyopathy and CAD s/p triple-vessel CABG on 03/17/2011 who is here today for cardiac follow up. I met him in the hospital in November 2016 when he presented with septic R knee prosthesis. We were consulted for pre-op risk assessment. He underwent knee revision 04/29/16 per Dr. Ronnie Derby. His cardiac issues have been followed in the past by [facility] in [place]. Echo November 2016 with LVEF of 25-30% but records show that his LVEF in 2013 was normal. Also noted to have mild AI and mild MR on most recent echo. He is on Xarelto for DVT in LE November 2016. He has gained 20 lbs last 6 months. I saw him in the office 09/22/15 and he c/o LE edema, weight gain and fatigue. No chest pain. I started Lasix and arranged an echo which showed LVEF=25-30%. A pleural effusion was noted. Chest x-ray with small left pleural effusion, mild vascular congestion and possible infiltrate c/w pneumonia. Dr. Cathi Roan started treatment for pneumonia.   He is here today for follow up. He has had no chest pain. He had had a dramatic improvement in his dyspnea since starting lasix and being treated for pneumonia. LE edema mostly resolved. Fatigue is better.     Primary Care Physician: Cyndy Freeze, MD  Past Medical History  Diagnosis Date  . Hypertension   . Hyperlipidemia   . Prostate cancer ([place])     3 YEARS AGO(BEING OBSERVED)  . Hyperthyroidism   . CAD (coronary artery disease)   . Ischemic cardiomyopathy     Past Surgical History  Procedure Laterality Date  . Bilateral knee arthroscopy    . Rotator cuff repair      LEFT  . Cholecystectomy    . Coronary artery bypass graft    . Incision and drainage Right 04/28/2015    Procedure: INCISION AND DRAINAGE;  Surgeon: Vickey Huger, MD;  Location: [facility];  Service: Orthopedics;  Laterality: Right;  .  Total knee revision Right 04/28/2015    Procedure: TOTAL KNEE REVISION;  Surgeon: Vickey Huger, MD;  Location: [facility];  Service: Orthopedics;  Laterality: Right;    Current Outpatient Prescriptions  Medication Sig Dispense Refill  . acetaminophen (TYLENOL) 325 MG tablet Take 2 tablets (650 mg total) by mouth every 6 (six) hours as needed for mild pain (or Fever >/= 101).    . CALCIUM-MAGNESIUM-ZINC PO Take 1 tablet by mouth 3 (three) times daily.     . carvedilol (COREG) 3.125 MG tablet Take 3.125 mg by mouth 2 (two) times daily.    . Cholecalciferol (VITAMIN D-3 PO) Take 1,000 Units by mouth daily.      . ciprofloxacin (CIPRO) 500 MG tablet Take 1 tablet (500 mg total) by mouth 2 (two) times daily. 60 tablet 4  . furosemide (LASIX) 40 MG tablet Take 1 tablet (40 mg total) by mouth daily. 30 tablet 6  . methimazole (TAPAZOLE) 10 MG tablet Take 5 mg by mouth every other day.     . rivaroxaban (XARELTO) 20 MG TABS tablet Take 1 tablet (20 mg total) by mouth daily with supper. First dose on Wed 05/21/15 at 1700    . senna-docusate (SENOKOT-S) 8.6-50 MG tablet Take 1 tablet by mouth 2 (two) times daily.    . simvastatin (ZOCOR) 10 MG tablet Take 10 mg by mouth at bedtime.      . tamsulosin (  FLOMAX) 0.4 MG CAPS capsule Take 0.4 mg by mouth at bedtime.    Marland Kitchen lisinopril (PRINIVIL,ZESTRIL) 5 MG tablet Take 1 tablet (5 mg total) by mouth daily. 30 tablet 6   No current facility-administered medications for this visit.    No Known Allergies  Social History   Social History  . Marital Status: Married    Spouse Name: N/A  . Number of Children: N/A  . Years of Education: N/A   Occupational History  . Not on file.   Social History Main Topics  . Smoking status: Never Smoker   . Smokeless tobacco: Not on file  . Alcohol Use: No  . Drug Use: Not on file  . Sexual Activity: Not on file   Other Topics Concern  . Not on file   Social History Narrative    Family History  Problem Relation  Age of Onset  . Stroke Father   . Heart disease Brother   . Diabetes Sister     Review of Systems:  As stated in the HPI and otherwise negative.   BP 140/78 mmHg  Pulse 79  Ht 6' (1.829 m)  Wt 199 lb 12.8 oz (90.629 kg)  BMI 27.09 kg/m2  SpO2 97%  Physical Examination: General: Well developed, well nourished, NAD HEENT: OP clear, mucus membranes moist SKIN: warm, dry. No rashes. Neuro: No focal deficits Musculoskeletal: Muscle strength 5/5 all ext Psychiatric: Mood and affect normal Neck: No JVD, no carotid bruits, no thyromegaly, no lymphadenopathy. Lungs:Clear bilaterally, no wheezes, rhonci, crackles Cardiovascular: Regular rate and rhythm. No murmurs, gallops or rubs. Abdomen:Soft. Bowel sounds present. Non-tender.  Extremities: Trace bilateral lower extremity edema. Pulses are 2 + in the bilateral DP/PT.  Echo 09/22/15: Left ventricle: The cavity size was normal. Wall thickness was  normal. Systolic function was severely reduced. The estimated  ejection fraction was in the range of 25% to 30%. Diffuse  hypokinesis. - Left atrium: The atrium was moderately dilated. - Right ventricle: The cavity size was normal. Systolic function  was moderately reduced. - Pericardium, extracardiac: There was a left pleural effusion.  Chest x-ray 09/23/15: 1. New infiltrate at the left lung base worrisome for pneumonia. Increased but still small left pleural effusion. Mild overall improvement in the pulmonary interstitial markings elsewhere. Followup PA and lateral chest X-ray is recommended in 3-4 weeks following trial of antibiotic therapy to ensure resolution and exclude underlying malignancy. 2. Top-normal cardiac size with central pulmonary vascular congestion, improved.  EKG:  EKG is not ordered today. The ekg ordered today demonstrates   Recent Labs: 04/22/2015: B Natriuretic Peptide 621.8*; TSH 2.759 04/28/2015: ALT 54 09/30/2015: BUN 16; Creat 0.84; Hemoglobin 14.0;  Platelets 193; Potassium 4.2; Sodium 139   Lipid Panel    Component Value Date/Time   CHOL 99 04/22/2015 0115   TRIG 89 04/22/2015 0115   HDL 22* 04/22/2015 0115   CHOLHDL 4.5 04/22/2015 0115   VLDL 18 04/22/2015 0115   LDLCALC 59 04/22/2015 0115     Wt Readings from Last 3 Encounters:  10/10/15 199 lb 12.8 oz (90.629 kg)  09/30/15 197 lb 12 oz (89.699 kg)  09/22/15 211 lb 9.6 oz (95.981 kg)     Other studies Reviewed: Additional studies/ records that were reviewed today include: All records from outside cardiology reviewed including labs, office visits and echo report, old EKGs.   Assessment and Plan:   1. CAD: He is s/p CABG. He has had no chest pain and dyspnea is resolved with diuresis.  I will continue Lasix. Will not plan cath at this time as he has resolution of symptoms with diuresis. I will continue Coreg and statin. He is not on an ASA since he is on Xarelto. His LVEF reduced but given age and no angina, no plans for cath.   2. Ischemic Cardiomyopathy: New LV systolic dysfunction since 2013. LVEF is 25-30% by echo April 2017. Will continue beta blocker. Will start Lisinopril 5 mg po qdaily. Will check BMET ten days.   3. Acute on chronic systolic CHF: Will continue Lasix 40 mg daily. Follow weight daily.   4. HTN: BP is controlled today. No changes.   Current medicines are reviewed at length with the patient today.  The patient does not have concerns regarding medicines.  The following changes have been made:  no change  Labs/ tests ordered today include:   Orders Placed This Encounter  Procedures  . Basic Metabolic Panel (BMET)    Disposition:   FU with me in 3 months  Signed, Lauree Chandler, MD 10/10/2015 4:45 PM    [facility] [place], [place], [place]  [address] Phone: [phone]; Fax: [phone]

## 2015-10-10 NOTE — Patient Instructions (Addendum)
Medication Instructions:  Your physician has recommended you make the following change in your medication: Start lisinopril 5 mg by mouth daily.    Labwork: Your physician recommends that you return for lab work in: 10 days. Scheduled for May 15,2017--BMP.  The lab opens at 7:30 AM   Testing/Procedures: none  Follow-Up: Your physician recommends that you schedule a follow-up appointment in: 3 months. --Scheduled for August 4,2017 at 2:45   Any Other Special Instructions Will Be Listed Below (If Applicable).     If you need a refill on your cardiac medications before your next appointment, please call your pharmacy.

## 2015-10-13 DIAGNOSIS — R338 Other retention of urine: Secondary | ICD-10-CM | POA: Diagnosis not present

## 2015-10-13 DIAGNOSIS — Z0000 Encounter for general adult medical examination without abnormal findings: Secondary | ICD-10-CM | POA: Diagnosis not present

## 2015-10-13 DIAGNOSIS — R350 Frequency of micturition: Secondary | ICD-10-CM | POA: Diagnosis not present

## 2015-10-13 DIAGNOSIS — R351 Nocturia: Secondary | ICD-10-CM | POA: Diagnosis not present

## 2015-10-13 DIAGNOSIS — C61 Malignant neoplasm of prostate: Secondary | ICD-10-CM | POA: Diagnosis not present

## 2015-10-13 DIAGNOSIS — R3915 Urgency of urination: Secondary | ICD-10-CM | POA: Diagnosis not present

## 2015-10-20 ENCOUNTER — Other Ambulatory Visit (INDEPENDENT_AMBULATORY_CARE_PROVIDER_SITE_OTHER): Payer: Medicare Other | Admitting: *Deleted

## 2015-10-20 DIAGNOSIS — I5023 Acute on chronic systolic (congestive) heart failure: Secondary | ICD-10-CM | POA: Diagnosis not present

## 2015-10-20 LAB — BASIC METABOLIC PANEL
BUN: 18 mg/dL (ref 7–25)
CHLORIDE: 102 mmol/L (ref 98–110)
CO2: 27 mmol/L (ref 20–31)
Calcium: 9.3 mg/dL (ref 8.6–10.3)
Creat: 0.76 mg/dL (ref 0.70–1.11)
Glucose, Bld: 86 mg/dL (ref 65–99)
Potassium: 4.2 mmol/L (ref 3.5–5.3)
SODIUM: 138 mmol/L (ref 135–146)

## 2015-10-20 NOTE — Addendum Note (Signed)
Addended by: Eulis Foster on: 10/20/2015 12:14 PM   Modules accepted: Orders

## 2015-10-30 DIAGNOSIS — H353211 Exudative age-related macular degeneration, right eye, with active choroidal neovascularization: Secondary | ICD-10-CM | POA: Diagnosis not present

## 2015-11-10 ENCOUNTER — Encounter: Payer: Self-pay | Admitting: Internal Medicine

## 2015-11-10 ENCOUNTER — Ambulatory Visit (INDEPENDENT_AMBULATORY_CARE_PROVIDER_SITE_OTHER): Payer: Medicare Other | Admitting: Internal Medicine

## 2015-11-10 VITALS — BP 114/60 | HR 76 | Temp 98.0°F | Ht 72.0 in | Wt 197.0 lb

## 2015-11-10 DIAGNOSIS — T8450XS Infection and inflammatory reaction due to unspecified internal joint prosthesis, sequela: Secondary | ICD-10-CM | POA: Diagnosis not present

## 2015-11-10 DIAGNOSIS — I2510 Atherosclerotic heart disease of native coronary artery without angina pectoris: Secondary | ICD-10-CM | POA: Diagnosis not present

## 2015-11-10 DIAGNOSIS — B965 Pseudomonas (aeruginosa) (mallei) (pseudomallei) as the cause of diseases classified elsewhere: Secondary | ICD-10-CM | POA: Diagnosis not present

## 2015-11-10 DIAGNOSIS — A498 Other bacterial infections of unspecified site: Secondary | ICD-10-CM

## 2015-11-10 LAB — CBC WITH DIFFERENTIAL/PLATELET
BASOS PCT: 0 %
Basophils Absolute: 0 cells/uL (ref 0–200)
EOS PCT: 4 %
Eosinophils Absolute: 268 cells/uL (ref 15–500)
HCT: 42.7 % (ref 38.5–50.0)
HEMOGLOBIN: 13.7 g/dL (ref 13.2–17.1)
LYMPHS ABS: 2077 {cells}/uL (ref 850–3900)
Lymphocytes Relative: 31 %
MCH: 29.6 pg (ref 27.0–33.0)
MCHC: 32.1 g/dL (ref 32.0–36.0)
MCV: 92.2 fL (ref 80.0–100.0)
MPV: 10.9 fL (ref 7.5–12.5)
Monocytes Absolute: 469 cells/uL (ref 200–950)
Monocytes Relative: 7 %
NEUTROS ABS: 3886 {cells}/uL (ref 1500–7800)
Neutrophils Relative %: 58 %
Platelets: 158 10*3/uL (ref 140–400)
RBC: 4.63 MIL/uL (ref 4.20–5.80)
RDW: 14.2 % (ref 11.0–15.0)
WBC: 6.7 10*3/uL (ref 3.8–10.8)

## 2015-11-10 LAB — C-REACTIVE PROTEIN

## 2015-11-10 LAB — BASIC METABOLIC PANEL
BUN: 19 mg/dL (ref 7–25)
CALCIUM: 9.1 mg/dL (ref 8.6–10.3)
CO2: 30 mmol/L (ref 20–31)
Chloride: 103 mmol/L (ref 98–110)
Creat: 0.81 mg/dL (ref 0.70–1.11)
Glucose, Bld: 81 mg/dL (ref 65–99)
Potassium: 4 mmol/L (ref 3.5–5.3)
SODIUM: 140 mmol/L (ref 135–146)

## 2015-11-10 NOTE — Progress Notes (Signed)
   Subjective:    Patient ID: Anthony Osborn, male    DOB: [birth year], 82 y.o.   MRN: [id number]  HPI Underwent 1 staged revision in Nov 2016 for pseudomonas PJI, of right knee,now on cipro @ 1month mark,just finished last pill this morning. Doing well overall. Denies diarrhea,or candidal skin infection  No Known Allergies Current Outpatient Prescriptions on File Prior to Visit  Medication Sig Dispense Refill  . acetaminophen (TYLENOL) 325 MG tablet Take 2 tablets (650 mg total) by mouth every 6 (six) hours as needed for mild pain (or Fever >/= 101).    . CALCIUM-MAGNESIUM-ZINC PO Take 1 tablet by mouth 3 (three) times daily.     . carvedilol (COREG) 3.125 MG tablet Take 3.125 mg by mouth 2 (two) times daily.    . Cholecalciferol (VITAMIN D-3 PO) Take 1,000 Units by mouth daily.      . furosemide (LASIX) 40 MG tablet Take 1 tablet (40 mg total) by mouth daily. 30 tablet 6  . lisinopril (PRINIVIL,ZESTRIL) 5 MG tablet Take 1 tablet (5 mg total) by mouth daily. 30 tablet 6  . methimazole (TAPAZOLE) 10 MG tablet Take 5 mg by mouth every other day.     . rivaroxaban (XARELTO) 20 MG TABS tablet Take 1 tablet (20 mg total) by mouth daily with supper. First dose on Wed 05/21/15 at 1700    . senna-docusate (SENOKOT-S) 8.6-50 MG tablet Take 1 tablet by mouth 2 (two) times daily.    . simvastatin (ZOCOR) 10 MG tablet Take 10 mg by mouth at bedtime.      . tamsulosin (FLOMAX) 0.4 MG CAPS capsule Take 0.4 mg by mouth at bedtime.     No current facility-administered medications on file prior to visit.   Active Ambulatory Problems    Diagnosis Date Noted  . Hyperlipidemia   . Prostate cancer ([place])   . CAD (coronary artery disease) 04/21/2015  . Septic arthritis of knee, right ([place]) 04/21/2015  . Cough 04/21/2015  . Hyperthyroidism   . Pyogenic arthritis of right knee joint ([place])   . Essential hypertension   . Encounter for palliative care   . DVT (deep venous thrombosis) ([place]) 04/27/2015  .  Pseudomonas infection    Resolved Ambulatory Problems    Diagnosis Date Noted  . No Resolved Ambulatory Problems   Past Medical History  Diagnosis Date  . Hypertension   . Ischemic cardiomyopathy       Review of Systems No fever/chills/nightsweats/ joint pain/ or effusion. 10 point ros is otherwise negative    Objective:   Physical Exam BP 114/60 mmHg  Pulse 76  Temp(Src) 98 F (36.7 C) (Oral)  Ht 6' (1.829 m)  Wt 197 lb (89.359 kg)  BMI 26.71 kg/m2 gen = a x o by 3 in NAD. HEENT = MMM, OP clear Ext: no effusions noted in knees Ext: surgical incision mark to both knees, no swelling, no warmth, or erythema  Lab Results  Component Value Date   ESRSEDRATE 5 11/10/2015   Lab Results  Component Value Date   CRP <0.5 11/10/2015        Assessment & Plan:  Pseudomonal prosthetic joint infection Will check sed rate and crp. Labs appeared that they have normalized. Will repeat in 4 wk off of abtx. Will minimize exposure to ciprofloxacin if possible  rtc in 4-5 wk

## 2015-11-11 LAB — SEDIMENTATION RATE: SED RATE: 5 mm/h (ref 0–20)

## 2015-11-27 DIAGNOSIS — E0590 Thyrotoxicosis, unspecified without thyrotoxic crisis or storm: Secondary | ICD-10-CM | POA: Diagnosis not present

## 2015-11-27 DIAGNOSIS — I824Z3 Acute embolism and thrombosis of unspecified deep veins of distal lower extremity, bilateral: Secondary | ICD-10-CM | POA: Diagnosis not present

## 2015-11-27 DIAGNOSIS — I25812 Atherosclerosis of bypass graft of coronary artery of transplanted heart without angina pectoris: Secondary | ICD-10-CM | POA: Diagnosis not present

## 2015-11-27 DIAGNOSIS — E7800 Pure hypercholesterolemia, unspecified: Secondary | ICD-10-CM | POA: Diagnosis not present

## 2015-12-04 DIAGNOSIS — H353211 Exudative age-related macular degeneration, right eye, with active choroidal neovascularization: Secondary | ICD-10-CM | POA: Diagnosis not present

## 2015-12-15 ENCOUNTER — Encounter: Payer: Self-pay | Admitting: Internal Medicine

## 2015-12-15 ENCOUNTER — Ambulatory Visit (INDEPENDENT_AMBULATORY_CARE_PROVIDER_SITE_OTHER): Payer: Medicare Other | Admitting: Internal Medicine

## 2015-12-15 VITALS — BP 112/68 | HR 75 | Temp 98.0°F | Ht 72.0 in | Wt 199.0 lb

## 2015-12-15 DIAGNOSIS — T8450XS Infection and inflammatory reaction due to unspecified internal joint prosthesis, sequela: Secondary | ICD-10-CM

## 2015-12-15 DIAGNOSIS — I2510 Atherosclerotic heart disease of native coronary artery without angina pectoris: Secondary | ICD-10-CM | POA: Diagnosis not present

## 2015-12-15 DIAGNOSIS — B965 Pseudomonas (aeruginosa) (mallei) (pseudomallei) as the cause of diseases classified elsewhere: Secondary | ICD-10-CM | POA: Diagnosis not present

## 2015-12-15 DIAGNOSIS — A498 Other bacterial infections of unspecified site: Secondary | ICD-10-CM

## 2015-12-15 LAB — BASIC METABOLIC PANEL
BUN: 20 mg/dL (ref 7–25)
CO2: 29 mmol/L (ref 20–31)
Calcium: 9.1 mg/dL (ref 8.6–10.3)
Chloride: 100 mmol/L (ref 98–110)
Creat: 0.75 mg/dL (ref 0.70–1.11)
GLUCOSE: 95 mg/dL (ref 65–99)
Potassium: 4.1 mmol/L (ref 3.5–5.3)
Sodium: 138 mmol/L (ref 135–146)

## 2015-12-15 LAB — CBC WITH DIFFERENTIAL/PLATELET
Basophils Absolute: 0 cells/uL (ref 0–200)
Basophils Relative: 0 %
EOS ABS: 693 {cells}/uL — AB (ref 15–500)
Eosinophils Relative: 9 %
HEMATOCRIT: 43.5 % (ref 38.5–50.0)
Hemoglobin: 14.2 g/dL (ref 13.2–17.1)
LYMPHS ABS: 2002 {cells}/uL (ref 850–3900)
Lymphocytes Relative: 26 %
MCH: 30 pg (ref 27.0–33.0)
MCHC: 32.6 g/dL (ref 32.0–36.0)
MCV: 92 fL (ref 80.0–100.0)
MONO ABS: 770 {cells}/uL (ref 200–950)
MPV: 10 fL (ref 7.5–12.5)
Monocytes Relative: 10 %
NEUTROS ABS: 4235 {cells}/uL (ref 1500–7800)
Neutrophils Relative %: 55 %
PLATELETS: 197 10*3/uL (ref 140–400)
RBC: 4.73 MIL/uL (ref 4.20–5.80)
RDW: 14.6 % (ref 11.0–15.0)
WBC: 7.7 10*3/uL (ref 3.8–10.8)

## 2015-12-15 LAB — C-REACTIVE PROTEIN: CRP: 0.5 mg/dL (ref <0.60)

## 2015-12-15 LAB — SEDIMENTATION RATE: Sed Rate: 14 mm/hr (ref 0–20)

## 2015-12-15 NOTE — Progress Notes (Signed)
RFV: follow up for prosthetic joint infection Subjective:    Patient ID: Anthony Osborn, male    DOB: February 13, 1920, 82 y.o.   MRN: [id number]  HPI 82yo M with history of HTN ,HLD, CAD, hx of right TKA presented in November 2016 with pseudomonal prosthetic joint infection of right knee. He underwent 1 staged revision, treated initially with 6 wk of cefepime, then converted to 4.15months of ciprofloxacin for suppression. His inflammatory markers normalized at the end of the treatment course. He is here today for 4 wk follow up off of abtx. He states he is doing well. He has not had further falls, dizziness, no pain or swelling or redness to his right knee.  No Known Allergies Current Outpatient Prescriptions on File Prior to Visit  Medication Sig Dispense Refill  . acetaminophen (TYLENOL) 325 MG tablet Take 2 tablets (650 mg total) by mouth every 6 (six) hours as needed for mild pain (or Fever >/= 101).    . carvedilol (COREG) 3.125 MG tablet Take 3.125 mg by mouth 2 (two) times daily.    . Cholecalciferol (VITAMIN D-3 PO) Take 1,000 Units by mouth daily.      . furosemide (LASIX) 40 MG tablet Take 1 tablet (40 mg total) by mouth daily. 30 tablet 6  . lisinopril (PRINIVIL,ZESTRIL) 5 MG tablet Take 1 tablet (5 mg total) by mouth daily. 30 tablet 6  . methimazole (TAPAZOLE) 10 MG tablet Take 5 mg by mouth every other day.     . simvastatin (ZOCOR) 10 MG tablet Take 10 mg by mouth at bedtime.      Marland Kitchen CALCIUM-MAGNESIUM-ZINC PO Take 1 tablet by mouth 3 (three) times daily. Reported on 12/15/2015    . rivaroxaban (XARELTO) 20 MG TABS tablet Take 1 tablet (20 mg total) by mouth daily with supper. First dose on Wed 05/21/15 at 1700 (Patient not taking: Reported on 12/15/2015)    . senna-docusate (SENOKOT-S) 8.6-50 MG tablet Take 1 tablet by mouth 2 (two) times daily. (Patient not taking: Reported on 12/15/2015)    . tamsulosin (FLOMAX) 0.4 MG CAPS capsule Take 0.4 mg by mouth at bedtime. Reported on 12/15/2015      No current facility-administered medications on file prior to visit.   Active Ambulatory Problems    Diagnosis Date Noted  . Hyperlipidemia   . Prostate cancer ([place])   . CAD (coronary artery disease) 04/21/2015  . Septic arthritis of knee, right ([place]) 04/21/2015  . Cough 04/21/2015  . Hyperthyroidism   . Pyogenic arthritis of right knee joint ([place])   . Essential hypertension   . Encounter for palliative care   . DVT (deep venous thrombosis) ([place]) 04/27/2015  . Pseudomonas infection    Resolved Ambulatory Problems    Diagnosis Date Noted  . No Resolved Ambulatory Problems   Past Medical History  Diagnosis Date  . Hypertension   . Ischemic cardiomyopathy     Social History  Substance Use Topics  . Smoking status: Never Smoker   . Smokeless tobacco: Never Used  . Alcohol Use: No    Review of Systems  Constitutional: Negative for fever, chills, diaphoresis, activity change, appetite change, fatigue and unexpected weight change.  HENT: Negative for congestion, sore throat, rhinorrhea, sneezing, trouble swallowing and sinus pressure.  Eyes: Negative for photophobia and visual disturbance.  Respiratory: Negative for cough, chest tightness, shortness of breath, wheezing and stridor.  Cardiovascular: Negative for chest pain, palpitations and leg swelling.  Gastrointestinal: Negative for nausea, vomiting, abdominal pain, diarrhea, constipation,  blood in stool, abdominal distention and anal bleeding.  Genitourinary: Negative for dysuria, hematuria, flank pain and difficulty urinating.  Musculoskeletal: Negative for myalgias, back pain, joint swelling, arthralgias and gait problem.  Skin: Negative for color change, pallor, rash and wound.  Neurological: Negative for dizziness, tremors, weakness and light-headedness.  Hematological: Negative for adenopathy. Does not bruise/bleed easily.  Psychiatric/Behavioral: Negative for behavioral problems, confusion, sleep disturbance,  dysphoric mood, decreased concentration and agitation.       Objective:   Physical Exam BP 112/68 mmHg  Pulse 75  Temp(Src) 98 F (36.7 C) (Oral)  Ht 6' (1.829 m)  Wt 199 lb (90.266 kg)  BMI 26.98 kg/m2 gne = a xo by 3 in NAD Ext = right knee is well healed incision is clean.  Lab Results  Component Value Date   ESRSEDRATE 14 12/15/2015   Lab Results  Component Value Date   CRP 0.5 12/15/2015        Assessment & Plan:  Pseudomonal prosthetic joint infection, finished  6 months of abtx treatment- will check sed rate, crp, cbc, and bmp  Addendum - inflammatory makrers still normal ranged after being off of treatment for 1 month. Can continue off of abtx. Will watch for any symptoms of knee swelling or redness or pain.  - rtc in 3 months

## 2015-12-16 DIAGNOSIS — H8113 Benign paroxysmal vertigo, bilateral: Secondary | ICD-10-CM | POA: Diagnosis not present

## 2015-12-16 DIAGNOSIS — M25462 Effusion, left knee: Secondary | ICD-10-CM | POA: Diagnosis not present

## 2015-12-22 DIAGNOSIS — S42001A Fracture of unspecified part of right clavicle, initial encounter for closed fracture: Secondary | ICD-10-CM | POA: Diagnosis not present

## 2015-12-22 DIAGNOSIS — S42009A Fracture of unspecified part of unspecified clavicle, initial encounter for closed fracture: Secondary | ICD-10-CM | POA: Diagnosis not present

## 2015-12-22 DIAGNOSIS — M249 Joint derangement, unspecified: Secondary | ICD-10-CM | POA: Diagnosis not present

## 2015-12-22 DIAGNOSIS — M24111 Other articular cartilage disorders, right shoulder: Secondary | ICD-10-CM | POA: Diagnosis not present

## 2015-12-24 DIAGNOSIS — S42021A Displaced fracture of shaft of right clavicle, initial encounter for closed fracture: Secondary | ICD-10-CM | POA: Diagnosis not present

## 2015-12-31 DIAGNOSIS — S42021A Displaced fracture of shaft of right clavicle, initial encounter for closed fracture: Secondary | ICD-10-CM | POA: Diagnosis not present

## 2016-01-06 DIAGNOSIS — M25511 Pain in right shoulder: Secondary | ICD-10-CM | POA: Diagnosis not present

## 2016-01-06 DIAGNOSIS — S42021D Displaced fracture of shaft of right clavicle, subsequent encounter for fracture with routine healing: Secondary | ICD-10-CM | POA: Diagnosis not present

## 2016-01-06 DIAGNOSIS — M6281 Muscle weakness (generalized): Secondary | ICD-10-CM | POA: Diagnosis not present

## 2016-01-08 DIAGNOSIS — H353211 Exudative age-related macular degeneration, right eye, with active choroidal neovascularization: Secondary | ICD-10-CM | POA: Diagnosis not present

## 2016-01-09 ENCOUNTER — Ambulatory Visit: Payer: Medicare Other | Admitting: Cardiovascular Disease

## 2016-01-09 DIAGNOSIS — S42021D Displaced fracture of shaft of right clavicle, subsequent encounter for fracture with routine healing: Secondary | ICD-10-CM | POA: Diagnosis not present

## 2016-01-09 DIAGNOSIS — M6281 Muscle weakness (generalized): Secondary | ICD-10-CM | POA: Diagnosis not present

## 2016-01-09 DIAGNOSIS — M25511 Pain in right shoulder: Secondary | ICD-10-CM | POA: Diagnosis not present

## 2016-01-13 DIAGNOSIS — S42031D Displaced fracture of lateral end of right clavicle, subsequent encounter for fracture with routine healing: Secondary | ICD-10-CM | POA: Diagnosis not present

## 2016-01-13 DIAGNOSIS — S42021D Displaced fracture of shaft of right clavicle, subsequent encounter for fracture with routine healing: Secondary | ICD-10-CM | POA: Diagnosis not present

## 2016-01-13 DIAGNOSIS — I10 Essential (primary) hypertension: Secondary | ICD-10-CM | POA: Diagnosis not present

## 2016-01-13 DIAGNOSIS — H8113 Benign paroxysmal vertigo, bilateral: Secondary | ICD-10-CM | POA: Diagnosis not present

## 2016-01-13 DIAGNOSIS — M6281 Muscle weakness (generalized): Secondary | ICD-10-CM | POA: Diagnosis not present

## 2016-01-13 DIAGNOSIS — M25511 Pain in right shoulder: Secondary | ICD-10-CM | POA: Diagnosis not present

## 2016-01-13 DIAGNOSIS — E8881 Metabolic syndrome: Secondary | ICD-10-CM | POA: Diagnosis not present

## 2016-01-15 DIAGNOSIS — M6281 Muscle weakness (generalized): Secondary | ICD-10-CM | POA: Diagnosis not present

## 2016-01-15 DIAGNOSIS — M25511 Pain in right shoulder: Secondary | ICD-10-CM | POA: Diagnosis not present

## 2016-01-15 DIAGNOSIS — S42021D Displaced fracture of shaft of right clavicle, subsequent encounter for fracture with routine healing: Secondary | ICD-10-CM | POA: Diagnosis not present

## 2016-01-20 DIAGNOSIS — L97521 Non-pressure chronic ulcer of other part of left foot limited to breakdown of skin: Secondary | ICD-10-CM | POA: Diagnosis not present

## 2016-01-20 DIAGNOSIS — B351 Tinea unguium: Secondary | ICD-10-CM | POA: Insufficient documentation

## 2016-01-20 DIAGNOSIS — M25511 Pain in right shoulder: Secondary | ICD-10-CM | POA: Diagnosis not present

## 2016-01-20 DIAGNOSIS — M6281 Muscle weakness (generalized): Secondary | ICD-10-CM | POA: Diagnosis not present

## 2016-01-20 DIAGNOSIS — S42021D Displaced fracture of shaft of right clavicle, subsequent encounter for fracture with routine healing: Secondary | ICD-10-CM | POA: Diagnosis not present

## 2016-01-22 ENCOUNTER — Encounter (HOSPITAL_COMMUNITY)
Admission: AD | Disposition: A | Discharge status: Home / Self Care | Payer: Self-pay | Source: Ambulatory Visit | Attending: Cardiovascular Disease

## 2016-01-22 ENCOUNTER — Inpatient Hospital Stay (HOSPITAL_COMMUNITY)
Admission: AD | Admit: 2016-01-22 | Discharge: 2016-01-24 | DRG: 244 | Disposition: A | Discharge status: Home / Self Care | Payer: Medicare Other | Source: Ambulatory Visit | Attending: Cardiovascular Disease | Admitting: Cardiovascular Disease

## 2016-01-22 ENCOUNTER — Ambulatory Visit (INDEPENDENT_AMBULATORY_CARE_PROVIDER_SITE_OTHER): Payer: Medicare Other | Admitting: Cardiovascular Disease

## 2016-01-22 ENCOUNTER — Encounter: Payer: Self-pay | Admitting: Cardiovascular Disease

## 2016-01-22 ENCOUNTER — Encounter (HOSPITAL_COMMUNITY): Payer: Self-pay | Admitting: General Practice

## 2016-01-22 VITALS — BP 126/58 | HR 70 | Ht 72.0 in | Wt 194.6 lb

## 2016-01-22 DIAGNOSIS — E785 Hyperlipidemia, unspecified: Secondary | ICD-10-CM | POA: Diagnosis present

## 2016-01-22 DIAGNOSIS — R404 Transient alteration of awareness: Secondary | ICD-10-CM | POA: Diagnosis not present

## 2016-01-22 DIAGNOSIS — I255 Ischemic cardiomyopathy: Secondary | ICD-10-CM | POA: Diagnosis present

## 2016-01-22 DIAGNOSIS — Z96653 Presence of artificial knee joint, bilateral: Secondary | ICD-10-CM | POA: Diagnosis present

## 2016-01-22 DIAGNOSIS — R55 Syncope and collapse: Secondary | ICD-10-CM | POA: Diagnosis present

## 2016-01-22 DIAGNOSIS — Z8249 Family history of ischemic heart disease and other diseases of the circulatory system: Secondary | ICD-10-CM

## 2016-01-22 DIAGNOSIS — Z961 Presence of intraocular lens: Secondary | ICD-10-CM | POA: Diagnosis present

## 2016-01-22 DIAGNOSIS — Z7982 Long term (current) use of aspirin: Secondary | ICD-10-CM

## 2016-01-22 DIAGNOSIS — Z86718 Personal history of other venous thrombosis and embolism: Secondary | ICD-10-CM

## 2016-01-22 DIAGNOSIS — I442 Atrioventricular block, complete: Secondary | ICD-10-CM | POA: Diagnosis not present

## 2016-01-22 DIAGNOSIS — Z8546 Personal history of malignant neoplasm of prostate: Secondary | ICD-10-CM

## 2016-01-22 DIAGNOSIS — Z79899 Other long term (current) drug therapy: Secondary | ICD-10-CM

## 2016-01-22 DIAGNOSIS — E0590 Thyrotoxicosis, unspecified without thyrotoxic crisis or storm: Secondary | ICD-10-CM | POA: Diagnosis present

## 2016-01-22 DIAGNOSIS — Z951 Presence of aortocoronary bypass graft: Secondary | ICD-10-CM

## 2016-01-22 DIAGNOSIS — Z950 Presence of cardiac pacemaker: Secondary | ICD-10-CM | POA: Diagnosis not present

## 2016-01-22 DIAGNOSIS — I2510 Atherosclerotic heart disease of native coronary artery without angina pectoris: Secondary | ICD-10-CM | POA: Diagnosis present

## 2016-01-22 DIAGNOSIS — Z9842 Cataract extraction status, left eye: Secondary | ICD-10-CM | POA: Diagnosis not present

## 2016-01-22 DIAGNOSIS — Z9841 Cataract extraction status, right eye: Secondary | ICD-10-CM | POA: Diagnosis not present

## 2016-01-22 DIAGNOSIS — I10 Essential (primary) hypertension: Secondary | ICD-10-CM | POA: Diagnosis present

## 2016-01-22 DIAGNOSIS — S064X0A Epidural hemorrhage without loss of consciousness, initial encounter: Secondary | ICD-10-CM | POA: Diagnosis not present

## 2016-01-22 HISTORY — DX: Acute embolism and thrombosis of unspecified deep veins of unspecified lower extremity: I82.409

## 2016-01-22 HISTORY — DX: Major depressive disorder, single episode, unspecified: F32.9

## 2016-01-22 HISTORY — DX: Other pulmonary embolism without acute cor pulmonale: I26.99

## 2016-01-22 HISTORY — DX: Depression, unspecified: F32.A

## 2016-01-22 HISTORY — PX: CARDIAC CATHETERIZATION: SHX172

## 2016-01-22 LAB — BASIC METABOLIC PANEL
Anion gap: 8 (ref 5–15)
BUN: 15 mg/dL (ref 6–20)
CALCIUM: 9.5 mg/dL (ref 8.9–10.3)
CO2: 26 mmol/L (ref 22–32)
Chloride: 105 mmol/L (ref 101–111)
Creatinine, Ser: 0.83 mg/dL (ref 0.61–1.24)
GFR calc Af Amer: >60 mL/min (ref >60)
GLUCOSE: 104 mg/dL — AB (ref 65–99)
Potassium: 4.6 mmol/L (ref 3.5–5.1)
Sodium: 139 mmol/L (ref 135–145)

## 2016-01-22 LAB — TSH: TSH: 2.608 u[IU]/mL (ref 0.350–4.500)

## 2016-01-22 LAB — TROPONIN I: TROPONIN I: 0.03 ng/mL — AB (ref <0.03)

## 2016-01-22 LAB — MRSA PCR SCREENING: MRSA by PCR: NEGATIVE

## 2016-01-22 SURGERY — TEMPORARY PACEMAKER
Anesthesia: LOCAL

## 2016-01-22 MED ORDER — TRAMADOL HCL 50 MG PO TABS
50.0000 mg | ORAL_TABLET | Freq: Four times a day (QID) | ORAL | Status: DC | PRN
  Administered 2016-01-22: 50 mg via ORAL
  Filled 2016-01-22: qty 1

## 2016-01-22 MED ORDER — SODIUM CHLORIDE 0.9 % IV SOLN: INTRAVENOUS | Status: DC

## 2016-01-22 MED ORDER — LISINOPRIL 5 MG PO TABS
5.0000 mg | ORAL_TABLET | Freq: Every day | ORAL | Status: DC
  Administered 2016-01-23 – 2016-01-24 (×2): 5 mg via ORAL
  Filled 2016-01-22 (×2): qty 1

## 2016-01-22 MED ORDER — METHIMAZOLE 5 MG PO TABS
5.0000 mg | ORAL_TABLET | ORAL | Status: DC
  Administered 2016-01-24: 5 mg via ORAL
  Filled 2016-01-22: qty 1

## 2016-01-22 MED ORDER — ENOXAPARIN SODIUM 40 MG/0.4ML ~~LOC~~ SOLN
40.0000 mg | SUBCUTANEOUS | Status: DC
  Administered 2016-01-22: 40 mg via SUBCUTANEOUS
  Filled 2016-01-22: qty 0.4

## 2016-01-22 MED ORDER — SODIUM CHLORIDE 0.9% FLUSH
3.0000 mL | Freq: Two times a day (BID) | INTRAVENOUS | Status: DC
  Administered 2016-01-23 (×2): 3 mL via INTRAVENOUS

## 2016-01-22 MED ORDER — HEPARIN (PORCINE) IN NACL 2-0.9 UNIT/ML-% IJ SOLN
INTRAMUSCULAR | Status: DC | PRN
  Administered 2016-01-22: 500 mL

## 2016-01-22 MED ORDER — SENNOSIDES-DOCUSATE SODIUM 8.6-50 MG PO TABS
1.0000 | ORAL_TABLET | Freq: Two times a day (BID) | ORAL | Status: DC
  Administered 2016-01-22 – 2016-01-24 (×4): 1 via ORAL
  Filled 2016-01-22 (×4): qty 1

## 2016-01-22 MED ORDER — SODIUM CHLORIDE 0.9 % IV SOLN
INTRAVENOUS | Status: DC | PRN
  Administered 2016-01-22: 10 mL/h via INTRAVENOUS

## 2016-01-22 MED ORDER — ATROPINE SULFATE 1 MG/10ML IJ SOSY
PREFILLED_SYRINGE | INTRAMUSCULAR | Status: AC
  Filled 2016-01-22: qty 10

## 2016-01-22 MED ORDER — CARVEDILOL 3.125 MG PO TABS: 3.1250 mg | ORAL_TABLET | Freq: Two times a day (BID) | ORAL | Status: DC

## 2016-01-22 MED ORDER — LIDOCAINE HCL (PF) 1 % IJ SOLN
INTRAMUSCULAR | Status: DC | PRN
  Administered 2016-01-22: 15 mL

## 2016-01-22 MED ORDER — SIMVASTATIN 10 MG PO TABS
10.0000 mg | ORAL_TABLET | Freq: Every day | ORAL | Status: DC
  Administered 2016-01-22 – 2016-01-23 (×2): 10 mg via ORAL
  Filled 2016-01-22 (×2): qty 1

## 2016-01-22 MED ORDER — HEPARIN (PORCINE) IN NACL 2-0.9 UNIT/ML-% IJ SOLN
INTRAMUSCULAR | Status: AC
  Filled 2016-01-22: qty 500

## 2016-01-22 MED ORDER — SENNA-DOCUSATE SODIUM 8.6-50 MG PO TABS: 1.0000 | ORAL_TABLET | Freq: Two times a day (BID) | ORAL | Status: DC

## 2016-01-22 MED ORDER — LIDOCAINE HCL (PF) 1 % IJ SOLN
INTRAMUSCULAR | Status: AC
  Filled 2016-01-22: qty 30

## 2016-01-22 SURGICAL SUPPLY — 4 items
CABLE ADAPT CONN TEMP 6FT (ADAPTER) ×1 IMPLANT
CATH S G BIP PACING (SET/KITS/TRAYS/PACK) ×1 IMPLANT
SHEATH PINNACLE 6F 10CM (SHEATH) ×1 IMPLANT
SLEEVE REPOSITIONING LENGTH 30 (MISCELLANEOUS) ×1 IMPLANT

## 2016-01-22 NOTE — H&P (Addendum)
Chief Complaint  Patient presents with  . Coronary Artery Disease   History of Present Illness: 82 yo with past medical history of hypertension, hyperlipidemia, hypothyroidism, cardiomyopathy and CAD s/p triple-vessel CABG on 03/17/2011 who is here today for cardiac follow up. I met him in the hospital in November 2016 when he presented with septic R knee prosthesis. We were consulted for pre-op risk assessment. He underwent knee revision 04/29/16 per Dr. Ronnie Derby. His cardiac issues have been followed in the past by [facility] in [place]. Echo November 2016 with LVEF of 25-30% but records show that his LVEF in 2013 was normal. Also noted to have mild AI and mild MR on most recent echo. He is on Xarelto for DVT in LE November 2016. He has gained 20 lbs last 6 months. I saw him in the office 09/22/15 and he c/o LE edema, weight gain and fatigue. No chest pain. I started Lasix and arranged an echo which showed LVEF=25-30%. A pleural effusion was noted. Chest x-ray with small left pleural effusion, mild vascular congestion and possible infiltrate c/w pneumonia. Dr. Cathi Roan started treatment for pneumonia. I saw him in May 2017 and he was feeling better after diuresis.   He is here today for follow up. Since I saw him in May 2017, he has had multiple syncopal events. He describes a "roaring" in his head and then feels dizzy and passes out. He is having an episode right now. He has no chest pain or dyspnea. His weight is stable. He has been taking lasix daily. No palpitations.      Primary Care Physician: Cyndy Freeze, MD      Past Medical History:  Diagnosis Date  . CAD (coronary artery disease)   . Hyperlipidemia   . Hypertension   . Hyperthyroidism   . Ischemic cardiomyopathy   . Prostate cancer ([place])    3 YEARS AGO(BEING OBSERVED)         Past Surgical History:  Procedure Laterality Date  . BILATERAL KNEE ARTHROSCOPY    . CHOLECYSTECTOMY    . CORONARY ARTERY  BYPASS GRAFT    . INCISION AND DRAINAGE Right 04/28/2015   Procedure: INCISION AND DRAINAGE;  Surgeon: Vickey Huger, MD;  Location: [facility];  Service: Orthopedics;  Laterality: Right;  . ROTATOR CUFF REPAIR     LEFT  . TOTAL KNEE REVISION Right 04/28/2015   Procedure: TOTAL KNEE REVISION;  Surgeon: Vickey Huger, MD;  Location: [facility];  Service: Orthopedics;  Laterality: Right;    Current Outpatient Prescriptions  Medication Sig Dispense Refill  . acetaminophen (TYLENOL) 325 MG tablet Take 2 tablets (650 mg total) by mouth every 6 (six) hours as needed for mild pain (or Fever >/= 101).    . CALCIUM-MAGNESIUM-ZINC PO Take 1 tablet by mouth 3 (three) times daily. Reported on 12/15/2015    . carvedilol (COREG) 3.125 MG tablet Take 3.125 mg by mouth 2 (two) times daily.    . Cholecalciferol (VITAMIN D-3 PO) Take 1,000 Units by mouth daily.      . furosemide (LASIX) 40 MG tablet Take 1 tablet (40 mg total) by mouth daily. 30 tablet 6  . lisinopril (PRINIVIL,ZESTRIL) 5 MG tablet Take 1 tablet (5 mg total) by mouth daily. 30 tablet 6  . methimazole (TAPAZOLE) 10 MG tablet Take 5 mg by mouth every other day.     . sennosides-docusate sodium (SENOKOT-S) 8.6-50 MG tablet Take 1 tablet by mouth 2 (two) times daily.    Marland Kitchen  simvastatin (ZOCOR) 10 MG tablet Take 10 mg by mouth at bedtime.       No current facility-administered medications for this visit.     No Known Allergies  Social History        Social History  . Marital status: Married    Spouse name: N/A  . Number of children: N/A  . Years of education: N/A      Occupational History  . Not on file.       Social History Main Topics  . Smoking status: Never Smoker  . Smokeless tobacco: Never Used  . Alcohol use No  . Drug use: No  . Sexual activity: Not on file       Other Topics Concern  . Not on file      Social History Narrative  . No narrative on file         Family History  Problem  Relation Age of Onset  . Stroke Father   . Heart disease Brother   . Diabetes Sister     Review of Systems:  As stated in the HPI and otherwise negative.   BP (!) 126/58   Pulse 70   Ht 6' (1.829 m)   Wt 194 lb 9.6 oz (88.3 kg)   SpO2 95%   BMI 26.39 kg/m   Physical Examination: General: Well developed, well nourished, NAD  HEENT: OP clear, mucus membranes moist  SKIN: warm, dry. No rashes. Neuro: No focal deficits  Musculoskeletal: Muscle strength 5/5 all ext  Psychiatric: Mood and affect normal  Neck: No JVD, no carotid bruits, no thyromegaly, no lymphadenopathy.  Lungs:Clear bilaterally, no wheezes, rhonci, crackles Cardiovascular: Regular rate and rhythm. No murmurs, gallops or rubs. Abdomen:Soft. Bowel sounds present. Non-tender.  Extremities: Trace bilateral lower extremity edema. Pulses are 2 + in the bilateral DP/PT.  Echo 09/22/15: Left ventricle: The cavity size was normal. Wall thickness was  normal. Systolic function was severely reduced. The estimated  ejection fraction was in the range of 25% to 30%. Diffuse  hypokinesis. - Left atrium: The atrium was moderately dilated. - Right ventricle: The cavity size was normal. Systolic function  was moderately reduced. - Pericardium, extracardiac: There was a left pleural effusion.  Chest x-ray 09/23/15: 1. New infiltrate at the left lung base worrisome for pneumonia. Increased but still small left pleural effusion. Mild overall improvement in the pulmonary interstitial markings elsewhere. Followup PA and lateral chest X-ray is recommended in 3-4 weeks following trial of antibiotic therapy to ensure resolution and exclude underlying malignancy. 2. Top-normal cardiac size with central pulmonary vascular congestion, improved.  EKG:  EKG is  ordered today. The ekg ordered today demonstrates Sinus 1st degree AV block  Recent Labs: 04/22/2015: B Natriuretic Peptide 621.8; TSH 2.759 04/28/2015: ALT  54 12/15/2015: BUN 20; Creat 0.75; Hemoglobin 14.2; Platelets 197; Potassium 4.1; Sodium 138   Lipid Panel Labs(Brief)          Component Value Date/Time   CHOL 99 04/22/2015 0115   TRIG 89 04/22/2015 0115   HDL 22 (L) 04/22/2015 0115   CHOLHDL 4.5 04/22/2015 0115   VLDL 18 04/22/2015 0115   LDLCALC 59 04/22/2015 0115          Wt Readings from Last 3 Encounters:  01/22/16 194 lb 9.6 oz (88.3 kg)  12/15/15 199 lb (90.3 kg)  11/10/15 197 lb (89.4 kg)     Other studies Reviewed: Additional studies/ records that were reviewed today include: All records from outside cardiology  reviewed including labs, office visits and echo report, old EKGs.   Assessment and Plan:   1. Syncope: He has been having multiple syncopal events over the last 6 weeks. He was seen in the [facility] ED 4 weeks ago after a syncopal event and broke his clavicle. He was not admitted. He was seen in primary care and was told to come here for follow up.  BP is stable today. HR is regular on exam. EKG with Sinus, 1st degree AV block. Poor R wave progression precordial leads. I am concerned that he could be having periods of heart block.  -Admit to telemetry unit at [facility] -Cardiac monitoring -Follow BP closely -Hold Lasix -Check carotid dopplers -Gentle fluid hydration -Cycle troponin despite absence of chest pain  2. CAD: He is s/p CABG. He has had no chest pain and dyspnea. I will continue Coreg and statin. He is not on an ASA since he is on Xarelto. His LVEF reduced but given age and no angina, no plans for cath.   3. Ischemic Cardiomyopathy: LVEF is 25-30% by echo April 2017. Will continue beta blocker and Lisinopril    3. Chronic systolic CHF: Will hold Lasix today. Follow weight daily.   4. HTN: BP is controlled today. No changes.   Current medicines are reviewed at length with the patient today.  The patient does not have concerns regarding medicines.  The following changes have been  made:  no change  Labs/ tests ordered today include:      Orders Placed This Encounter  Procedures  . EKG 12-Lead    Disposition:   FU with me after discharge.   Signed, Lauree Chandler, MD 01/22/2016 3:34 PM    [facility] [address], [place], [place]  [address] Phone: [phone]; Fax: [phone]

## 2016-01-22 NOTE — Patient Instructions (Signed)
YOU ARE BEING ADMITTED TO [facility]

## 2016-01-22 NOTE — Progress Notes (Signed)
Patient admitted for recurrent syncope. Found to have high grade heart block on telemetry. Multiple pauses up to 6.5 sec. Has not had recurrent syncope on the floor, but has had multiple episodes outside the hospital even sustaining clavicular fracture with one. Zoll pads placed emergently. Case d/w Dr. Tamala Julian and Dr. Angelena Form -> proceed emergently to cath lab for temp wire. Risks, benefits, alternatives d/w the patient. Tsh pending. D/c carvedilol.  Sumedha Munnerlyn PA-C

## 2016-01-22 NOTE — Progress Notes (Addendum)
Chief Complaint  Patient presents with  . Coronary Artery Disease   History of Present Illness: 82 yo with past medical history of hypertension, hyperlipidemia, hypothyroidism, cardiomyopathy and CAD s/p triple-vessel CABG on 03/17/2011 who is here today for cardiac follow up. I met him in the hospital in November 2016 when he presented with septic R knee prosthesis. We were consulted for pre-op risk assessment. He underwent knee revision 04/29/16 per Dr. Ronnie Derby. His cardiac issues have been followed in the past by [facility] in [place]. Echo November 2016 with LVEF of 25-30% but records show that his LVEF in 2013 was normal. Also noted to have mild AI and mild MR on most recent echo. He is on Xarelto for DVT in LE November 2016. He has gained 20 lbs last 6 months. I saw him in the office 09/22/15 and he c/o LE edema, weight gain and fatigue. No chest pain. I started Lasix and arranged an echo which showed LVEF=25-30%. A pleural effusion was noted. Chest x-ray with small left pleural effusion, mild vascular congestion and possible infiltrate c/w pneumonia. Dr. Cathi Roan started treatment for pneumonia. I saw him in May 2017 and he was feeling better after diuresis.   He is here today for follow up. Since I saw him in May 2017, he has had multiple syncopal events. He describes a "roaring" in his head and then feels dizzy and passes out. He is having an episode right now. He has no chest pain or dyspnea. His weight is stable. He has been taking lasix daily. No palpitations.      Primary Care Physician: Cyndy Freeze, MD  Past Medical History:  Diagnosis Date  . CAD (coronary artery disease)   . Hyperlipidemia   . Hypertension   . Hyperthyroidism   . Ischemic cardiomyopathy   . Prostate cancer ([place])    3 YEARS AGO(BEING OBSERVED)    Past Surgical History:  Procedure Laterality Date  . BILATERAL KNEE ARTHROSCOPY    . CHOLECYSTECTOMY    . CORONARY ARTERY BYPASS GRAFT    . INCISION AND  DRAINAGE Right 04/28/2015   Procedure: INCISION AND DRAINAGE;  Surgeon: Vickey Huger, MD;  Location: [facility];  Service: Orthopedics;  Laterality: Right;  . ROTATOR CUFF REPAIR     LEFT  . TOTAL KNEE REVISION Right 04/28/2015   Procedure: TOTAL KNEE REVISION;  Surgeon: Vickey Huger, MD;  Location: [facility];  Service: Orthopedics;  Laterality: Right;    Current Outpatient Prescriptions  Medication Sig Dispense Refill  . acetaminophen (TYLENOL) 325 MG tablet Take 2 tablets (650 mg total) by mouth every 6 (six) hours as needed for mild pain (or Fever >/= 101).    . CALCIUM-MAGNESIUM-ZINC PO Take 1 tablet by mouth 3 (three) times daily. Reported on 12/15/2015    . carvedilol (COREG) 3.125 MG tablet Take 3.125 mg by mouth 2 (two) times daily.    . Cholecalciferol (VITAMIN D-3 PO) Take 1,000 Units by mouth daily.      . furosemide (LASIX) 40 MG tablet Take 1 tablet (40 mg total) by mouth daily. 30 tablet 6  . lisinopril (PRINIVIL,ZESTRIL) 5 MG tablet Take 1 tablet (5 mg total) by mouth daily. 30 tablet 6  . methimazole (TAPAZOLE) 10 MG tablet Take 5 mg by mouth every other day.     . sennosides-docusate sodium (SENOKOT-S) 8.6-50 MG tablet Take 1 tablet by mouth 2 (two) times daily.    . simvastatin (ZOCOR) 10 MG tablet Take 10 mg by mouth at bedtime.  No current facility-administered medications for this visit.     No Known Allergies  Social History   Social History  . Marital status: Married    Spouse name: N/A  . Number of children: N/A  . Years of education: N/A   Occupational History  . Not on file.   Social History Main Topics  . Smoking status: Never Smoker  . Smokeless tobacco: Never Used  . Alcohol use No  . Drug use: No  . Sexual activity: Not on file   Other Topics Concern  . Not on file   Social History Narrative  . No narrative on file    Family History  Problem Relation Age of Onset  . Stroke Father   . Heart disease Brother   . Diabetes Sister     Review  of Systems:  As stated in the HPI and otherwise negative.   BP (!) 126/58   Pulse 70   Ht 6' (1.829 m)   Wt 194 lb 9.6 oz (88.3 kg)   SpO2 95%   BMI 26.39 kg/m   Physical Examination: General: Well developed, well nourished, NAD  HEENT: OP clear, mucus membranes moist  SKIN: warm, dry. No rashes. Neuro: No focal deficits  Musculoskeletal: Muscle strength 5/5 all ext  Psychiatric: Mood and affect normal  Neck: No JVD, no carotid bruits, no thyromegaly, no lymphadenopathy.  Lungs:Clear bilaterally, no wheezes, rhonci, crackles Cardiovascular: Regular rate and rhythm. No murmurs, gallops or rubs. Abdomen:Soft. Bowel sounds present. Non-tender.  Extremities: Trace bilateral lower extremity edema. Pulses are 2 + in the bilateral DP/PT.  Echo 09/22/15: Left ventricle: The cavity size was normal. Wall thickness was  normal. Systolic function was severely reduced. The estimated  ejection fraction was in the range of 25% to 30%. Diffuse  hypokinesis. - Left atrium: The atrium was moderately dilated. - Right ventricle: The cavity size was normal. Systolic function  was moderately reduced. - Pericardium, extracardiac: There was a left pleural effusion.  Chest x-ray 09/23/15: 1. New infiltrate at the left lung base worrisome for pneumonia. Increased but still small left pleural effusion. Mild overall improvement in the pulmonary interstitial markings elsewhere. Followup PA and lateral chest X-ray is recommended in 3-4 weeks following trial of antibiotic therapy to ensure resolution and exclude underlying malignancy. 2. Top-normal cardiac size with central pulmonary vascular congestion, improved.  EKG:  EKG is  ordered today. The ekg ordered today demonstrates Sinus 1st degree AV block  Recent Labs: 04/22/2015: B Natriuretic Peptide 621.8; TSH 2.759 04/28/2015: ALT 54 12/15/2015: BUN 20; Creat 0.75; Hemoglobin 14.2; Platelets 197; Potassium 4.1; Sodium 138   Lipid Panel      Component Value Date/Time   CHOL 99 04/22/2015 0115   TRIG 89 04/22/2015 0115   HDL 22 (L) 04/22/2015 0115   CHOLHDL 4.5 04/22/2015 0115   VLDL 18 04/22/2015 0115   LDLCALC 59 04/22/2015 0115     Wt Readings from Last 3 Encounters:  01/22/16 194 lb 9.6 oz (88.3 kg)  12/15/15 199 lb (90.3 kg)  11/10/15 197 lb (89.4 kg)     Other studies Reviewed: Additional studies/ records that were reviewed today include: All records from outside cardiology reviewed including labs, office visits and echo report, old EKGs.   Assessment and Plan:   1. Syncope: He has been having multiple syncopal events over the last 6 weeks. He was seen in the [facility] ED 4 weeks ago after a syncopal event and broke his clavicle. He was not admitted.  He was seen in primary care and was told to come here for follow up.  BP is stable today. HR is regular on exam. EKG with Sinus, 1st degree AV block. Poor R wave progression precordial leads. Concern for heart block.   -Admit to telemetry unit at [facility] -Cardiac monitoring -Follow BP closely -Hold Lasix -Check carotid dopplers -Gentle fluid hydration -Cycle troponin despite absence of chest pain  2. CAD: He is s/p CABG. He has had no chest pain and dyspnea. I will continue Coreg and statin. He is not on an ASA since he is on Xarelto. His LVEF reduced but given age and no angina, no plans for cath.   3. Ischemic Cardiomyopathy: LVEF is 25-30% by echo April 2017. Will continue beta blocker and Lisinopril    3. Chronic systolic CHF: Will hold Lasix today. Follow weight daily.   4. HTN: BP is controlled today. No changes.   Current medicines are reviewed at length with the patient today.  The patient does not have concerns regarding medicines.  The following changes have been made:  no change  Labs/ tests ordered today include:   Orders Placed This Encounter  Procedures  . EKG 12-Lead    Disposition:   FU with me after discharge.   Signed, Lauree Chandler, MD 01/22/2016 3:34 PM    [facility] [place], [place], [place]  [address] Phone: [phone]; Fax: [phone]

## 2016-01-22 NOTE — Interval H&P Note (Signed)
History and Physical Interval Note:  01/22/2016 6:03 PM Recurrent episodes of syncope leading to admission. Observed high-grade AV block with up to 6.5 second pauses since admission. Anthony Osborn  has presented today for surgery, with the diagnosis of syncope  The various methods of treatment have been discussed with the patient and family. After consideration of risks, benefits and other options for treatment, the patient has consented to  Procedure(s): Temporary Pacemaker (N/A) as a surgical intervention .  The patient's history has been reviewed, patient examined, no change in status, stable for surgery.  I have reviewed the patient's chart and labs.  Questions were answered to the patient's satisfaction.     Belva Crome III

## 2016-01-23 ENCOUNTER — Encounter (HOSPITAL_COMMUNITY): Payer: Self-pay | Admitting: Interventional Cardiology

## 2016-01-23 ENCOUNTER — Encounter (HOSPITAL_COMMUNITY)
Admission: AD | Disposition: A | Discharge status: Home / Self Care | Payer: Self-pay | Source: Ambulatory Visit | Attending: Cardiovascular Disease

## 2016-01-23 ENCOUNTER — Inpatient Hospital Stay (HOSPITAL_COMMUNITY): Payer: Medicare Other

## 2016-01-23 DIAGNOSIS — I442 Atrioventricular block, complete: Principal | ICD-10-CM

## 2016-01-23 DIAGNOSIS — R55 Syncope and collapse: Secondary | ICD-10-CM

## 2016-01-23 HISTORY — PX: EP IMPLANTABLE DEVICE: SHX172B

## 2016-01-23 LAB — BASIC METABOLIC PANEL
Anion gap: 7 (ref 5–15)
BUN: 16 mg/dL (ref 6–20)
CALCIUM: 9.1 mg/dL (ref 8.9–10.3)
CHLORIDE: 105 mmol/L (ref 101–111)
CO2: 25 mmol/L (ref 22–32)
CREATININE: 0.77 mg/dL (ref 0.61–1.24)
Glucose, Bld: 76 mg/dL (ref 65–99)
Potassium: 4 mmol/L (ref 3.5–5.1)
SODIUM: 137 mmol/L (ref 135–145)

## 2016-01-23 LAB — CBC
HCT: 41.3 % (ref 39.0–52.0)
Hemoglobin: 13.5 g/dL (ref 13.0–17.0)
MCH: 31.2 pg (ref 26.0–34.0)
MCHC: 32.7 g/dL (ref 30.0–36.0)
MCV: 95.4 fL (ref 78.0–100.0)
PLATELETS: 165 10*3/uL (ref 150–400)
RBC: 4.33 MIL/uL (ref 4.22–5.81)
RDW: 14.4 % (ref 11.5–15.5)
WBC: 6.8 10*3/uL (ref 4.0–10.5)

## 2016-01-23 LAB — SURGICAL PCR SCREEN
MRSA, PCR: NEGATIVE
Staphylococcus aureus: NEGATIVE

## 2016-01-23 LAB — TROPONIN I: Troponin I: 0.03 ng/mL (ref <0.03)

## 2016-01-23 SURGERY — PACEMAKER IMPLANT
Anesthesia: LOCAL

## 2016-01-23 MED ORDER — SODIUM CHLORIDE 0.9 % IR SOLN
Status: AC
  Filled 2016-01-23: qty 2

## 2016-01-23 MED ORDER — LIDOCAINE HCL (PF) 1 % IJ SOLN
INTRAMUSCULAR | Status: DC | PRN
  Administered 2016-01-23: 38 mL via INTRADERMAL

## 2016-01-23 MED ORDER — SODIUM CHLORIDE 0.9 % IR SOLN
80.0000 mg | Status: AC
  Administered 2016-01-23: 80 mg
  Filled 2016-01-23: qty 2

## 2016-01-23 MED ORDER — HEPARIN (PORCINE) IN NACL 2-0.9 UNIT/ML-% IJ SOLN
INTRAMUSCULAR | Status: AC
  Filled 2016-01-23: qty 500

## 2016-01-23 MED ORDER — CHLORHEXIDINE GLUCONATE 4 % EX LIQD
60.0000 mL | Freq: Once | CUTANEOUS | Status: AC
  Administered 2016-01-23: 4 via TOPICAL

## 2016-01-23 MED ORDER — SODIUM CHLORIDE 0.9% FLUSH
3.0000 mL | Freq: Two times a day (BID) | INTRAVENOUS | Status: DC
  Administered 2016-01-23: 3 mL via INTRAVENOUS

## 2016-01-23 MED ORDER — CEFAZOLIN IN D5W 1 GM/50ML IV SOLN
1.0000 g | Freq: Four times a day (QID) | INTRAVENOUS | Status: AC
  Administered 2016-01-23 – 2016-01-24 (×3): 1 g via INTRAVENOUS
  Filled 2016-01-23 (×4): qty 50

## 2016-01-23 MED ORDER — IOPAMIDOL (ISOVUE-370) INJECTION 76%
INTRAVENOUS | Status: AC
  Filled 2016-01-23: qty 50

## 2016-01-23 MED ORDER — IOPAMIDOL (ISOVUE-370) INJECTION 76%
INTRAVENOUS | Status: DC | PRN
  Administered 2016-01-23: 10 mL via INTRAVENOUS

## 2016-01-23 MED ORDER — CEFAZOLIN SODIUM-DEXTROSE 2-4 GM/100ML-% IV SOLN
2.0000 g | INTRAVENOUS | Status: AC
  Administered 2016-01-23: 2 g via INTRAVENOUS
  Filled 2016-01-23: qty 100

## 2016-01-23 MED ORDER — CEFAZOLIN SODIUM-DEXTROSE 2-4 GM/100ML-% IV SOLN
INTRAVENOUS | Status: AC
  Filled 2016-01-23: qty 100

## 2016-01-23 MED ORDER — LORATADINE 10 MG PO TABS
10.0000 mg | ORAL_TABLET | Freq: Every day | ORAL | Status: DC
  Administered 2016-01-23: 10 mg via ORAL
  Filled 2016-01-23: qty 1

## 2016-01-23 MED ORDER — SODIUM CHLORIDE 0.9% FLUSH: 3.0000 mL | INTRAVENOUS | Status: DC | PRN

## 2016-01-23 MED ORDER — ONDANSETRON HCL 4 MG/2ML IJ SOLN
4.0000 mg | Freq: Four times a day (QID) | INTRAMUSCULAR | Status: DC | PRN
Start: 2016-01-23 — End: 2016-01-24

## 2016-01-23 MED ORDER — LIDOCAINE HCL (PF) 1 % IJ SOLN
INTRAMUSCULAR | Status: AC
  Filled 2016-01-23: qty 60

## 2016-01-23 MED ORDER — ACETAMINOPHEN 325 MG PO TABS: 325.0000 mg | ORAL_TABLET | ORAL | Status: DC | PRN

## 2016-01-23 MED ORDER — HEPARIN (PORCINE) IN NACL 2-0.9 UNIT/ML-% IJ SOLN
INTRAMUSCULAR | Status: DC | PRN
  Administered 2016-01-23: 12:00:00

## 2016-01-23 MED ORDER — NAPHAZOLINE-PHENIRAMINE 0.025-0.3 % OP SOLN
1.0000 [drp] | Freq: Every day | OPHTHALMIC | Status: DC
  Administered 2016-01-23: 1 [drp] via OPHTHALMIC
  Filled 2016-01-23: qty 5

## 2016-01-23 MED ORDER — CHLORHEXIDINE GLUCONATE 4 % EX LIQD
60.0000 mL | Freq: Once | CUTANEOUS | Status: AC
  Administered 2016-01-23: 4 via TOPICAL
  Filled 2016-01-23: qty 60

## 2016-01-23 MED ORDER — SODIUM CHLORIDE 0.9 % IV SOLN
INTRAVENOUS | Status: DC
  Administered 2016-01-23: 10:00:00 via INTRAVENOUS

## 2016-01-23 MED ORDER — ASPIRIN EC 81 MG PO TBEC
81.0000 mg | DELAYED_RELEASE_TABLET | Freq: Every day | ORAL | Status: DC
  Administered 2016-01-23 – 2016-01-24 (×2): 81 mg via ORAL
  Filled 2016-01-23 (×2): qty 1

## 2016-01-23 MED ORDER — CHLORHEXIDINE GLUCONATE 4 % EX LIQD
60.0000 mL | Freq: Once | CUTANEOUS | Status: DC
  Filled 2016-01-23: qty 60

## 2016-01-23 MED ORDER — SODIUM CHLORIDE 0.9 % IV SOLN: 250.0000 mL | INTRAVENOUS | Status: DC

## 2016-01-23 SURGICAL SUPPLY — 7 items
CABLE SURGICAL S-101-97-12 (CABLE) ×2
LEAD TENDRIL MRI 52CM LPA1200M (Lead) ×2 IMPLANT
LEAD TENDRIL MRI 58CM LPA1200M (Lead) ×2 IMPLANT
PACEMAKER ASSURITY DR-RF (Pacemaker) ×2 IMPLANT
PAD DEFIB LIFELINK (PAD) ×2
SHEATH CLASSIC 8F (SHEATH) ×4
TRAY PACEMAKER INSERTION (PACKS) ×2

## 2016-01-23 NOTE — Progress Notes (Signed)
Patient arrived from the cardiac cath lab in no apparent distress.  Patient was oriented to the unit and his room to include phone and call light.  Telemetry monitor was applied and CCMD notified.  Will continue to monitor.

## 2016-01-23 NOTE — H&P (View-Only) (Signed)
ELECTROPHYSIOLOGY CONSULT NOTE    Patient ID: Anthony Osborn MRN: [id number], DOB/AGE: 80-Mar-1921 80 y.o.  Admit date: 01/22/2016 Date of Consult: 01/23/2016  Primary Physician: Anthony Freeze, MD Primary Cardiologist: Dr. Angelena Osborn Requesting MD: Dr. Angelena Osborn   Reason for Consultation: high degree AVBlock, pauses, evaluate for PPM   HPI: Anthony Osborn is a 82 y.o. male with PMHx of CAD (CABG 2012), HTN, HLD, ICM, DVT in Nov 2016 treated with Xarelto,  with recent recurrent syncope, once 4 weeks ago resulting in R clavicle fracture.  He was in our office yesterday to see Dr. Julianne Osborn with reports of recurrent syncope with suspicion of heart block was directly admitted to [facility], he was then infact observed to have high grade heart block with multiple pauses up to 6.5 seconds.  He was urgently brought yesterday afternoon to cath lab for temp pacing wire insertion, his Coreg held, last dose was yesterday AM at home.  The patient reports he gets a sudden rushing sensation or roaring sensation in his head and suddenly faints with little other warning.  He denies any CP or palpitations, no SOB.  He feels OK this morning, currently V pacing at 50.  LABS: K+ 4.0 BUN/Creat 16/0.77 Trop <0.03, 0.03 x2 H/H 13/41 WBC 6.8 plts 165 TSH was 2.608   Past Medical History:  Diagnosis Date  . CAD (coronary artery disease)   . Depression    "after 1st wife died"  . DVT (deep venous thrombosis) ([place]) 04/2015  . Hyperlipidemia   . Hypertension   . Hyperthyroidism   . Ischemic cardiomyopathy   . Prostate cancer ([place])    3 YEARS AGO(BEING OBSERVED)  . Pulmonary embolism ([place]) 04/2015     Surgical History:  Past Surgical History:  Procedure Laterality Date  . CARDIAC CATHETERIZATION N/A 01/22/2016   Procedure: Temporary Pacemaker;  Surgeon: Belva Crome, MD;  Location: [place] [facility];  Service: Cardiovascular;  Laterality: N/A;  . CATARACT EXTRACTION W/ INTRAOCULAR LENS  IMPLANT, BILATERAL  Bilateral   . CORONARY ARTERY BYPASS GRAFT  03/2011   triple  . INCISION AND DRAINAGE Right 04/28/2015   Procedure: INCISION AND DRAINAGE;  Surgeon: Vickey Huger, MD;  Location: [place];  Service: Orthopedics;  Laterality: Right;  . JOINT REPLACEMENT    . LAPAROSCOPIC CHOLECYSTECTOMY    . PROSTATE BIOPSY  ~ 2010  . SHOULDER OPEN ROTATOR CUFF REPAIR Left   . TOTAL KNEE ARTHROPLASTY Bilateral   . TOTAL KNEE REVISION Right 04/28/2015   Procedure: TOTAL KNEE REVISION;  Surgeon: Vickey Huger, MD;  Location: [place];  Service: Orthopedics;  Laterality: Right;     Prescriptions Prior to Admission  Medication Sig Dispense Refill Last Dose  . acetaminophen (TYLENOL) 325 MG tablet Take 2 tablets (650 mg total) by mouth every 6 (six) hours as needed for mild pain (or Fever >/= 101).   01/21/2016 at Unknown time  . aspirin EC 81 MG tablet Take 81 mg by mouth daily.   01/21/2016 at Unknown time  . CALCIUM-MAGNESIUM-ZINC PO Take 1 tablet by mouth 3 (three) times daily. Reported on 12/15/2015   Past Week at Unknown time  . carvedilol (COREG) 3.125 MG tablet Take 3.125 mg by mouth 2 (two) times daily.   01/22/2016 at 0800  . Cholecalciferol (VITAMIN D-3 PO) Take 1,000 Units by mouth daily.     Past Week at Unknown time  . furosemide (LASIX) 40 MG tablet Take 1 tablet (40 mg total) by mouth daily. 30 tablet 6 01/21/2016  at Unknown time  . lisinopril (PRINIVIL,ZESTRIL) 5 MG tablet Take 1 tablet (5 mg total) by mouth daily. 30 tablet 6 01/22/2016 at Unknown time  . loratadine (CLARITIN) 10 MG tablet Take 10 mg by mouth at bedtime.   01/21/2016 at Unknown time  . methimazole (TAPAZOLE) 10 MG tablet Take 5 mg by mouth every other day.    01/22/2016 at Unknown time  . naphazoline-pheniramine (NAPHCON-A) 0.025-0.3 % ophthalmic solution Place 1 drop into both eyes at bedtime.   01/21/2016 at Unknown time  . sennosides-docusate sodium (SENOKOT-S) 8.6-50 MG tablet Take 1 tablet by mouth 2 (two) times daily.   01/22/2016 at Unknown  time  . simvastatin (ZOCOR) 10 MG tablet Take 5 mg by mouth at bedtime.    01/21/2016 at Unknown time    Inpatient Medications:  . lisinopril  5 mg Oral Daily  . methimazole  5 mg Oral QODAY  . senna-docusate  1 tablet Oral BID  . simvastatin  10 mg Oral QHS  . sodium chloride flush  3 mL Intravenous Q12H    Allergies: No Known Allergies  Social History   Social History  . Marital status: Married    Spouse name: N/A  . Number of children: N/A  . Years of education: N/A   Occupational History  . Not on file.   Social History Main Topics  . Smoking status: Never Smoker  . Smokeless tobacco: Never Used  . Alcohol use No  . Drug use: No  . Sexual activity: No   Other Topics Concern  . Not on file   Social History Narrative  . No narrative on file     Family History  Problem Relation Age of Onset  . Stroke Father   . Heart disease Brother   . Diabetes Sister      Review of Systems: All other systems reviewed and are otherwise negative except as noted above.  Physical Exam: Vitals:   01/23/16 0400 01/23/16 0500 01/23/16 0600 01/23/16 0700  BP: (!) 127/57 127/62 (!) 128/58 (!) 130/58  Pulse: (!) 59 (!) 55 (!) 54 (!) 54  Resp: 19 14 (!) 21 14  Temp: 98.8 F (37.1 C)     TempSrc: Oral     SpO2: 95% 95% 95% 96%  Weight:      Height:        GEN- The patient is well appearing, alert and oriented x 3 today.   HEENT: normocephalic, atraumatic; sclera clear, conjunctiva pink; hearing intact; oropharynx clear; neck supple, no JVP Lymph- no cervical lymphadenopathy Lungs- Clear to ausculation bilaterally, normal work of breathing.  No wheezes, rales, rhonchi Heart- Regular rate and rhythm,1/6 SM, no rubs or gallops, PMI not laterally displaced GI- soft, non-tender, non-distended, bowel sounds present Extremities- no clubbing, cyanosis, or edema MS- no significant deformity or atrophy Skin- warm and dry, no rash or lesion Psych- euthymic mood, full affect Neuro-  no gross deficits observed  Labs:   Lab Results  Component Value Date   WBC 6.8 01/23/2016   HGB 13.5 01/23/2016   HCT 41.3 01/23/2016   MCV 95.4 01/23/2016   PLT 165 01/23/2016    Recent Labs Lab 01/23/16 0446  NA 137  K 4.0  CL 105  CO2 25  BUN 16  CREATININE 0.77  CALCIUM 9.1  GLUCOSE 76      Radiology/Studies: No results found.  EKG: SR 1st degree AVblock TELEMETRY:  SR 1st degree AVBlock, periods of CHB with up to 6.8 second  pauses recurrently, this morning continues to high degree AVblock and pacing intermittently  Echo 09/22/15: Left ventricle: The cavity size was normal. Wall thickness was  normal. Systolic function was severely reduced. The estimated  ejection fraction was in the range of 25% to 30%. Diffuse  hypokinesis. - Left atrium: The atrium was moderately dilated. - Right ventricle: The cavity size was normal. Systolic function  was moderately reduced. - Pericardium, extracardiac: There was a left pleural effusion.  Assessment and Plan:   1. Recurrent syncope with trauma >> CHB with pauses up to 6.5 seconds observed on telemetry     S/p emergent temp pacing wire yesterday     On low dose coreg at home, was held here, last dose yesterday morning     He continues to have periods of high degree heart block and using temp pacer intermittently      Given the very low dose coreg, and now 24 hours Dr. Lovena Le feels is washed out at this point and no longer contributing to his heart block.      Dr. Lovena Le discussed PPM implant with the patient, risk/benefits and the patient wishes to proceed  2. CAD     No CP, negative Trop x3  3. ICM     Exam appears compensated  4. HTN     stable         Signed, Tommye Standard, Hershal Coria 01/23/2016 7:51 AM   EP Attending  Patient seen and examined. Agree with above. Elderly but well functioning man with symptomatic pauses admitted for PPM insertion. He was on 3.125 mg twice daily of coreg which was stopped and  he has had persistent pacing despite 36 hours of washout. He has had multiple prior syncopal episodes. He has known CAD but no anginal symptoms. Exam is notable for an iregular rhythm and clear lungs and no edema. Neuro is non-focal. Tele -NSR with ventricular pacing  A/P 1. CHB - persistent despite stopping his low dose beta blocker. I have discussed the indications for PPM insertion with the patient and he wishes to proceed.  2. CAD - stable 3. ICM - his advanced age precludes him from primary prevention ICD insertion 4. HTN - we can uptitrate his beta blocker once his PPM is inserted.  Mikle Bosworth.D.

## 2016-01-23 NOTE — Discharge Instructions (Signed)
° ° °  Supplemental Discharge Instructions for  Pacemaker/Defibrillator Patients  Activity No heavy lifting or vigorous activity with your left/right arm for 6 to 8 weeks.  Do not raise your left/right arm above your head for one week.  Gradually raise your affected arm as drawn below.             01/27/16                      01/28/16                   01/29/16                  01/30/16 __  NO DRIVING until cleared to at your follow up visit  [place] the wound area clean and dry.  Do not get this area wet for one week. No showers for one week; you may shower on  01/30/16   . - The tape/steri-strips on your wound will fall off; do not pull them off.  No bandage is needed on the site.  DO  NOT apply any creams, oils, or ointments to the wound area. - If you notice any drainage or discharge from the wound, any swelling or bruising at the site, or you develop a fever > 101? F after you are discharged home, call the office at once.  Special Instructions - You are still able to use cellular telephones; use the ear opposite the side where you have your pacemaker/defibrillator.  Avoid carrying your cellular phone near your device. - When traveling through airports, show security personnel your identification card to avoid being screened in the metal detectors.  Ask the security personnel to use the hand wand. - Avoid arc welding equipment, MRI testing (magnetic resonance imaging), TENS units (transcutaneous nerve stimulators).  Call the office for questions about other devices. - Avoid electrical appliances that are in poor condition or are not properly grounded. - Microwave ovens are safe to be near or to operate.  Additional information for defibrillator patients should your device go off: - If your device goes off ONCE and you feel fine afterward, notify the device clinic nurses. - If your device goes off ONCE and you do not feel well afterward, call 911. - If your device goes off TWICE,  call 911. - If your device goes off THREE times in one day, call 911.  DO NOT DRIVE YOURSELF OR A FAMILY MEMBER WITH A DEFIBRILLATOR TO THE HOSPITAL--CALL 911.

## 2016-01-23 NOTE — Interval H&P Note (Signed)
History and Physical Interval Note:  01/23/2016 12:27 PM  Anthony Osborn  has presented today for surgery, with the diagnosis of syncope  The various methods of treatment have been discussed with the patient and family. After consideration of risks, benefits and other options for treatment, the patient has consented to  Procedure(s): Pacemaker Implant (N/A) as a surgical intervention .  The patient's history has been reviewed, patient examined, no change in status, stable for surgery.  I have reviewed the patient's chart and labs.  Questions were answered to the patient's satisfaction.     Anthony Osborn

## 2016-01-23 NOTE — Progress Notes (Signed)
Removed suture from sheath and removed 63fr sheath rfv and applied direct pressure to site resulting in hemostasis to area.  Applied sterile dsg to site and post sheath instructions given with voice back understanding of the same.  Distal pulses palpatable and site without bruising hematoma or oozing to site.  Awaiting bed assignment in ccha # 3

## 2016-01-23 NOTE — Progress Notes (Signed)
**  Preliminary report by tech**  Carotid duplex completed Findings are consistent with a 1-39 percent stenosis involving the right internal carotid artery and the left internal carotid artery. The vertebral arteries demonstrate antegrade flow.  01/23/16 11:01 AM Anthony Osborn RVT

## 2016-01-23 NOTE — Consult Note (Signed)
ELECTROPHYSIOLOGY CONSULT NOTE    Patient ID: Anthony Osborn MRN: [id number], DOB/AGE: April 17, 1920 82 y.o.  Admit date: 01/22/2016 Date of Consult: 01/23/2016  Primary Physician: Cyndy Freeze, MD Primary Cardiologist: Dr. Angelena Form Requesting MD: Dr. Angelena Form   Reason for Consultation: high degree AVBlock, pauses, evaluate for PPM   HPI: Anthony Osborn is a 82 y.o. male with PMHx of CAD (CABG 2012), HTN, HLD, ICM, DVT in Nov 2016 treated with Xarelto,  with recent recurrent syncope, once 4 weeks ago resulting in R clavicle fracture.  He was in our office yesterday to see Dr. Julianne Handler with reports of recurrent syncope with suspicion of heart block was directly admitted to [facility], he was then infact observed to have high grade heart block with multiple pauses up to 6.5 seconds.  He was urgently brought yesterday afternoon to cath lab for temp pacing wire insertion, his Coreg held, last dose was yesterday AM at home.  The patient reports he gets a sudden rushing sensation or roaring sensation in his head and suddenly faints with little other warning.  He denies any CP or palpitations, no SOB.  He feels OK this morning, currently V pacing at 50.  LABS: K+ 4.0 BUN/Creat 16/0.77 Trop <0.03, 0.03 x2 H/H 13/41 WBC 6.8 plts 165 TSH was 2.608   Past Medical History:  Diagnosis Date  . CAD (coronary artery disease)   . Depression    "after 1st wife died"  . DVT (deep venous thrombosis) ([place]) 04/2015  . Hyperlipidemia   . Hypertension   . Hyperthyroidism   . Ischemic cardiomyopathy   . Prostate cancer ([place])    3 YEARS AGO(BEING OBSERVED)  . Pulmonary embolism ([place]) 04/2015     Surgical History:  Past Surgical History:  Procedure Laterality Date  . CARDIAC CATHETERIZATION N/A 01/22/2016   Procedure: Temporary Pacemaker;  Surgeon: Belva Crome, MD;  Location: [place] [facility];  Service: Cardiovascular;  Laterality: N/A;  . CATARACT EXTRACTION W/ INTRAOCULAR LENS  IMPLANT, BILATERAL  Bilateral   . CORONARY ARTERY BYPASS GRAFT  03/2011   triple  . INCISION AND DRAINAGE Right 04/28/2015   Procedure: INCISION AND DRAINAGE;  Surgeon: Vickey Huger, MD;  Location: [place];  Service: Orthopedics;  Laterality: Right;  . JOINT REPLACEMENT    . LAPAROSCOPIC CHOLECYSTECTOMY    . PROSTATE BIOPSY  ~ 2010  . SHOULDER OPEN ROTATOR CUFF REPAIR Left   . TOTAL KNEE ARTHROPLASTY Bilateral   . TOTAL KNEE REVISION Right 04/28/2015   Procedure: TOTAL KNEE REVISION;  Surgeon: Vickey Huger, MD;  Location: [facility];  Service: Orthopedics;  Laterality: Right;     Prescriptions Prior to Admission  Medication Sig Dispense Refill Last Dose  . acetaminophen (TYLENOL) 325 MG tablet Take 2 tablets (650 mg total) by mouth every 6 (six) hours as needed for mild pain (or Fever >/= 101).   01/21/2016 at Unknown time  . aspirin EC 81 MG tablet Take 81 mg by mouth daily.   01/21/2016 at Unknown time  . CALCIUM-MAGNESIUM-ZINC PO Take 1 tablet by mouth 3 (three) times daily. Reported on 12/15/2015   Past Week at Unknown time  . carvedilol (COREG) 3.125 MG tablet Take 3.125 mg by mouth 2 (two) times daily.   01/22/2016 at 0800  . Cholecalciferol (VITAMIN D-3 PO) Take 1,000 Units by mouth daily.     Past Week at Unknown time  . furosemide (LASIX) 40 MG tablet Take 1 tablet (40 mg total) by mouth daily. 30 tablet 6 01/21/2016  at Unknown time  . lisinopril (PRINIVIL,ZESTRIL) 5 MG tablet Take 1 tablet (5 mg total) by mouth daily. 30 tablet 6 01/22/2016 at Unknown time  . loratadine (CLARITIN) 10 MG tablet Take 10 mg by mouth at bedtime.   01/21/2016 at Unknown time  . methimazole (TAPAZOLE) 10 MG tablet Take 5 mg by mouth every other day.    01/22/2016 at Unknown time  . naphazoline-pheniramine (NAPHCON-A) 0.025-0.3 % ophthalmic solution Place 1 drop into both eyes at bedtime.   01/21/2016 at Unknown time  . sennosides-docusate sodium (SENOKOT-S) 8.6-50 MG tablet Take 1 tablet by mouth 2 (two) times daily.   01/22/2016 at Unknown  time  . simvastatin (ZOCOR) 10 MG tablet Take 5 mg by mouth at bedtime.    01/21/2016 at Unknown time    Inpatient Medications:  . lisinopril  5 mg Oral Daily  . methimazole  5 mg Oral QODAY  . senna-docusate  1 tablet Oral BID  . simvastatin  10 mg Oral QHS  . sodium chloride flush  3 mL Intravenous Q12H    Allergies: No Known Allergies  Social History   Social History  . Marital status: Married    Spouse name: N/A  . Number of children: N/A  . Years of education: N/A   Occupational History  . Not on file.   Social History Main Topics  . Smoking status: Never Smoker  . Smokeless tobacco: Never Used  . Alcohol use No  . Drug use: No  . Sexual activity: No   Other Topics Concern  . Not on file   Social History Narrative  . No narrative on file     Family History  Problem Relation Age of Onset  . Stroke Father   . Heart disease Brother   . Diabetes Sister      Review of Systems: All other systems reviewed and are otherwise negative except as noted above.  Physical Exam: Vitals:   01/23/16 0400 01/23/16 0500 01/23/16 0600 01/23/16 0700  BP: (!) 127/57 127/62 (!) 128/58 (!) 130/58  Pulse: (!) 59 (!) 55 (!) 54 (!) 54  Resp: 19 14 (!) 21 14  Temp: 98.8 F (37.1 C)     TempSrc: Oral     SpO2: 95% 95% 95% 96%  Weight:      Height:        GEN- The patient is well appearing, alert and oriented x 3 today.   HEENT: normocephalic, atraumatic; sclera clear, conjunctiva pink; hearing intact; oropharynx clear; neck supple, no JVP Lymph- no cervical lymphadenopathy Lungs- Clear to ausculation bilaterally, normal work of breathing.  No wheezes, rales, rhonchi Heart- Regular rate and rhythm,1/6 SM, no rubs or gallops, PMI not laterally displaced GI- soft, non-tender, non-distended, bowel sounds present Extremities- no clubbing, cyanosis, or edema MS- no significant deformity or atrophy Skin- warm and dry, no rash or lesion Psych- euthymic mood, full affect Neuro-  no gross deficits observed  Labs:   Lab Results  Component Value Date   WBC 6.8 01/23/2016   HGB 13.5 01/23/2016   HCT 41.3 01/23/2016   MCV 95.4 01/23/2016   PLT 165 01/23/2016    Recent Labs Lab 01/23/16 0446  NA 137  K 4.0  CL 105  CO2 25  BUN 16  CREATININE 0.77  CALCIUM 9.1  GLUCOSE 76      Radiology/Studies: No results found.  EKG: SR 1st degree AVblock TELEMETRY:  SR 1st degree AVBlock, periods of CHB with up to 6.8 second  pauses recurrently, this morning continues to high degree AVblock and pacing intermittently  Echo 09/22/15: Left ventricle: The cavity size was normal. Wall thickness was  normal. Systolic function was severely reduced. The estimated  ejection fraction was in the range of 25% to 30%. Diffuse  hypokinesis. - Left atrium: The atrium was moderately dilated. - Right ventricle: The cavity size was normal. Systolic function  was moderately reduced. - Pericardium, extracardiac: There was a left pleural effusion.  Assessment and Plan:   1. Recurrent syncope with trauma >> CHB with pauses up to 6.5 seconds observed on telemetry     S/p emergent temp pacing wire yesterday     On low dose coreg at home, was held here, last dose yesterday morning     He continues to have periods of high degree heart block and using temp pacer intermittently      Given the very low dose coreg, and now 24 hours Dr. Lovena Le feels is washed out at this point and no longer contributing to his heart block.      Dr. Lovena Le discussed PPM implant with the patient, risk/benefits and the patient wishes to proceed  2. CAD     No CP, negative Trop x3  3. ICM     Exam appears compensated  4. HTN     stable         Signed, Tommye Standard, Hershal Coria 01/23/2016 7:51 AM   EP Attending  Patient seen and examined. Agree with above. Elderly but well functioning man with symptomatic pauses admitted for PPM insertion. He was on 3.125 mg twice daily of coreg which was stopped and  he has had persistent pacing despite 36 hours of washout. He has had multiple prior syncopal episodes. He has known CAD but no anginal symptoms. Exam is notable for an iregular rhythm and clear lungs and no edema. Neuro is non-focal. Tele -NSR with ventricular pacing  A/P 1. CHB - persistent despite stopping his low dose beta blocker. I have discussed the indications for PPM insertion with the patient and he wishes to proceed.  2. CAD - stable 3. ICM - his advanced age precludes him from primary prevention ICD insertion 4. HTN - we can uptitrate his beta blocker once his PPM is inserted.  Mikle Bosworth.D.

## 2016-01-23 NOTE — Progress Notes (Signed)
Orthopedic Tech Progress Note Patient Details:  Anthony Osborn 1920-03-03 [id number]  Ortho Devices Type of Ortho Device: Arm sling   Anthony Osborn 01/23/2016, 3:58 PM

## 2016-01-24 ENCOUNTER — Inpatient Hospital Stay (HOSPITAL_COMMUNITY): Payer: Medicare Other

## 2016-01-24 DIAGNOSIS — Z950 Presence of cardiac pacemaker: Secondary | ICD-10-CM | POA: Diagnosis not present

## 2016-01-24 DIAGNOSIS — I255 Ischemic cardiomyopathy: Secondary | ICD-10-CM | POA: Diagnosis present

## 2016-01-24 MED ORDER — TRAMADOL HCL 50 MG PO TABS: 50.0000 mg | ORAL_TABLET | Freq: Four times a day (QID) | ORAL | 0 refills | Status: DC | PRN

## 2016-01-24 NOTE — Care Management Note (Signed)
Case Management Note  Patient Details  Name: Anthony Osborn MRN: [id number] Date of Birth: [birth year]  Subjective/Objective:                  high degree AVBlock, pauses, evaluate for PPM  Action/Plan: CM spoke to RN who states that patient needs a 3N1. Cm called Reggie with [facility] to request to bedside. CM called patient who said that he is in outpatient PT right now and does not have further needs. He lives at home with his spouse and said that all other needs are met at this time and no HH needed.   Expected Discharge Date:  01/24/16             Expected Discharge Plan:  Home/Self Care  In-House Referral:     Discharge planning Services  CM Consult  Post Acute Care Choice:  Durable Medical Equipment Choice offered to:  Patient  DME Arranged:  3-N-1 DME Agency:  [facility]:    [facility] Agency:     Status of Service:  Completed, signed off  If discussed at [place] of Stay Meetings, dates discussed:    Additional Comments:  Guido Sander, RN 01/24/2016, 10:49 AM

## 2016-01-24 NOTE — Progress Notes (Signed)
Bedside handoff communication to Myrtie Hawk, RN.

## 2016-01-24 NOTE — Discharge Summary (Signed)
Patient ID: Anthony Osborn,  MRN: [id number], DOB/AGE: 1920/04/04 82 y.o.  Admit date: 01/22/2016 Discharge date: 01/24/2016  Primary Care Provider: Cyndy Freeze, MD Primary Cardiologist: Dr Renakar/ Dr Lovena Le  Discharge Diagnoses Principal Problem:   Syncope Active Problems:   Third degree AV block [facility])   Pacemaker (St Jude) implanted 01/23/16   Hx of CABG 2012   Essential hypertension   Cardiomyopathy, ischemic-EF 25-30%   Hyperlipidemia    Procedures; T-TVDP 01/22/16                        P-TVDP (St Jude) 01/23/16   Hospital Course:  81 y.o. male with PMHx of CAD (CABG 2012), HTN, HLD, ICM-EF 25% April 2017 echo, DVT in Nov 2016 after knee surgery treated with Xarelto, with recent recurrent syncope, once 4 weeks ago resulting in R clavicle fracture.  He was seen in our office by Dr. Julianne Handler 01/22/16 with reports of recurrent syncope episodes with suspicion of heart block was directly admitted to [facility]. After admission he was then infact observed to have high grade heart block with multiple pauses up to 6.5 seconds on 01/22/16.  He was urgently brought to cath lab for temp pacing wire insertion. He was seen by Dr Lovena Le 01/23/16 and had a St Jude pacemaker placed which the pt tolerated well. Dr Rayann Heman saw him the morning of 01/24/16 and feels he can discharged. F/U at wound clinic has been arranged.   Discharge Vitals:  Blood pressure (!) 148/74, pulse 72, temperature 97.6 F (36.4 C), temperature source Oral, resp. rate 18, height 6' (1.829 m), weight 188 lb 7.9 oz (85.5 kg), SpO2 97 %.    Labs: No results found for this or any previous visit (from the past 24 hour(s)).  Disposition:  Follow-up Information    [place] [facility] Follow up on 02/04/2016.   Specialty:  Cardiology Why:  3:00PM, wound check Contact information: [address], [place] [phone]          Discharge Medications:    Medication List    TAKE these medications   acetaminophen 325 MG tablet Commonly known as:  TYLENOL Take 2 tablets (650 mg total) by mouth every 6 (six) hours as needed for mild pain (or Fever >/= 101).   aspirin EC 81 MG tablet Take 81 mg by mouth daily.   CALCIUM-MAGNESIUM-ZINC PO Take 1 tablet by mouth 3 (three) times daily. Reported on 12/15/2015   carvedilol 3.125 MG tablet Commonly known as:  COREG Take 3.125 mg by mouth 2 (two) times daily.   furosemide 40 MG tablet Commonly known as:  LASIX Take 1 tablet (40 mg total) by mouth daily.   lisinopril 5 MG tablet Commonly known as:  PRINIVIL,ZESTRIL Take 1 tablet (5 mg total) by mouth daily.   loratadine 10 MG tablet Commonly known as:  CLARITIN Take 10 mg by mouth at bedtime.   methimazole 10 MG tablet Commonly known as:  TAPAZOLE Take 5 mg by mouth every other day.   naphazoline-pheniramine 0.025-0.3 % ophthalmic solution Commonly known as:  NAPHCON-A Place 1 drop into both eyes at bedtime.   sennosides-docusate sodium 8.6-50 MG tablet Commonly known as:  SENOKOT-S Take 1 tablet by mouth 2 (two) times daily.   traMADol 50 MG tablet Commonly known as:  ULTRAM Take 1 tablet (50 mg total) by mouth every 6 (six) hours as needed for moderate pain.   VITAMIN D-3  PO Take 1,000 Units by mouth daily.   ZOCOR 10 MG tablet Generic drug:  simvastatin Take 5 mg by mouth at bedtime.        Duration of Discharge Encounter: Greater than 30 minutes including physician time.  Signed, Kerin Ransom PA-C 01/24/2016 11:03 AM    I have seen, examined the patient, and reviewed the above assessment and plan.  Changes to above are made where necessary.    Co Sign: Thompson Grayer, MD 01/24/2016 4:21 PM

## 2016-01-24 NOTE — Progress Notes (Signed)
   SUBJECTIVE: The patient is doing well today.  At this time, he denies chest pain, shortness of breath, or any new concerns.  Wants to go home.  Marland Kitchen aspirin EC  81 mg Oral Daily  . lisinopril  5 mg Oral Daily  . loratadine  10 mg Oral QHS  . methimazole  5 mg Oral QODAY  . naphazoline-pheniramine  1 drop Both Eyes QHS  . senna-docusate  1 tablet Oral BID  . simvastatin  10 mg Oral QHS  . sodium chloride flush  3 mL Intravenous Q12H   . sodium chloride 25 mL/hr at 01/22/16 2000    OBJECTIVE: Physical Exam: Vitals:   01/23/16 1715 01/23/16 1745 01/23/16 2118 01/24/16 0417  BP: 136/62 (!) 141/59 118/60 (!) 148/74  Pulse: 60 (!) 56 63 72  Resp:   19 18  Temp:   98.4 F (36.9 C) 97.6 F (36.4 C)  TempSrc:   Oral Oral  SpO2:   99% 97%  Weight:      Height:        Intake/Output Summary (Last 24 hours) at 01/24/16 0846 Last data filed at 01/24/16 0418  Gross per 24 hour  Intake            550.5 ml  Output              650 ml  Net            -99.5 ml    Telemetry reveals sinus rhythm with demand V pacing  GEN- The patient is elderly appearing, alert and oriented x 3 today.   Head- normocephalic, atraumatic Eyes-  Sclera clear, conjunctiva pink Ears- hearing intact Oropharynx- clear Neck- supple,   Lungs- Clear to ausculation bilaterally, normal work of breathing Heart- Regular rate and rhythm  GI- soft, NT, ND, + BS Extremities- no clubbing, cyanosis, or edema Skin- device pocket is without hematoma  LABS: Basic Metabolic Panel:  Recent Labs  01/22/16 1634 01/23/16 0446  NA 139 137  K 4.6 4.0  CL 105 105  CO2 26 25  GLUCOSE 104* 76  BUN 15 16  CREATININE 0.83 0.77  CALCIUM 9.5 9.1   Liver Function Tests: No results for input(s): AST, ALT, ALKPHOS, BILITOT, PROT, ALBUMIN in the last 72 hours. No results for input(s): LIPASE, AMYLASE in the last 72 hours. CBC:  Recent Labs  01/23/16 0446  WBC 6.8  HGB 13.5  HCT 41.3  MCV 95.4  PLT 165   Cardiac  Enzymes:  Recent Labs  01/22/16 1634 01/22/16 2215 01/23/16 0446  TROPONINI <0.03 0.03* 0.03*   Thyroid Function Tests:  Recent Labs  01/22/16 1432  TSH 2.608   Anemia Panel: No results for input(s): VITAMINB12, FOLATE, FERRITIN, TIBC, IRON, RETICCTPCT in the last 72 hours.  RADIOLOGY: CXR reviewed and reveals no ptx, stable leads  ASSESSMENT AND PLAN:  Principal Problem:   Syncope Active Problems:   CAD (coronary artery disease)   Third degree AV block (HCC)  Doing well s/p PPM.  Symptoms resolved Device interrogation is reviewed and normal  DC to home Routine wound care and follow-up Resume homoe medicines at discharge  Thompson Grayer, MD 01/24/2016 8:46 AM

## 2016-01-24 NOTE — Progress Notes (Signed)
Order received to discharge Pt.  Telemetry removed and CCMD notified.  IV removed with catheter intact.  Discharge education given to Pt with wife and family at bedside.  All questions answered.   Extended time discussing Pt ROM to left shoulder with use of handout for example.  Pt, wife and family indicate understanding.  Pt denies chest pain or sob.  Pt stable to discharge with family.

## 2016-01-26 ENCOUNTER — Encounter (HOSPITAL_COMMUNITY): Payer: Self-pay | Admitting: Internal Medicine

## 2016-01-26 LAB — VAS US CAROTID
LCCADDIAS: 15 cm/s
LCCADSYS: 88 cm/s
LEFT ECA DIAS: -1 cm/s
LEFT VERTEBRAL DIAS: -6 cm/s
LICADSYS: -118 cm/s
LICAPDIAS: 10 cm/s
LICAPSYS: 49 cm/s
Left CCA prox dias: 9 cm/s
Left CCA prox sys: 83 cm/s
Left ICA dist dias: -19 cm/s
RCCAPSYS: 73 cm/s
RIGHT ECA DIAS: -1 cm/s
RIGHT VERTEBRAL DIAS: -5 cm/s
Right CCA prox dias: 7 cm/s
Right cca dist sys: -69 cm/s

## 2016-01-27 ENCOUNTER — Telehealth: Payer: Self-pay | Admitting: Cardiovascular Disease

## 2016-01-27 NOTE — Telephone Encounter (Signed)
Follow Up:; ° ° °Returning your call. °

## 2016-01-27 NOTE — Telephone Encounter (Signed)
I spoke with pt's wife and reviewed carotid doppler results with her.  

## 2016-02-04 ENCOUNTER — Ambulatory Visit (INDEPENDENT_AMBULATORY_CARE_PROVIDER_SITE_OTHER): Payer: Medicare Other | Admitting: *Deleted

## 2016-02-04 DIAGNOSIS — I442 Atrioventricular block, complete: Secondary | ICD-10-CM

## 2016-02-04 DIAGNOSIS — Z950 Presence of cardiac pacemaker: Secondary | ICD-10-CM

## 2016-02-04 LAB — CUP PACEART INCLINIC DEVICE CHECK
Battery Voltage: 3.07 V
Brady Statistic RA Percent Paced: 2.6 %
Date Time Interrogation Session: 20170830180520
Implantable Lead Implant Date: 20170818
Implantable Lead Location: 753860
Lead Channel Impedance Value: 562.5 Ohm
Lead Channel Pacing Threshold Amplitude: 1 V
Lead Channel Pacing Threshold Pulse Width: 0.4 ms
Lead Channel Pacing Threshold Pulse Width: 0.4 ms
Lead Channel Setting Pacing Amplitude: 0.875
Lead Channel Setting Sensing Sensitivity: 2 mV
MDC IDC LEAD IMPLANT DT: 20170818
MDC IDC LEAD LOCATION: 753859
MDC IDC MSMT BATTERY REMAINING LONGEVITY: 134.4
MDC IDC MSMT LEADCHNL RA SENSING INTR AMPL: 4.3 mV
MDC IDC MSMT LEADCHNL RV IMPEDANCE VALUE: 600 Ohm
MDC IDC MSMT LEADCHNL RV PACING THRESHOLD AMPLITUDE: 0.625 V
MDC IDC SET LEADCHNL RA PACING AMPLITUDE: 2.5 V
MDC IDC SET LEADCHNL RV PACING PULSEWIDTH: 0.4 ms
MDC IDC STAT BRADY RV PERCENT PACED: 82 %
Pulse Gen Model: 2272
Pulse Gen Serial Number: 7939408

## 2016-02-04 NOTE — Progress Notes (Signed)
Wound check appointment. Steri-strips removed. Wound without redness or edema. Incision edges approximated, wound well healed. Normal device function. Thresholds, sensing, and impedances consistent with implant measurements. Device programmed with auto capture on (RV) for extra safety margin until 3 month visit. Histogram distribution appropriate for patient and level of activity. No mode switches or high ventricular rates noted. Patient educated about wound care, arm mobility, lifting restrictions. ROV with GT on 04/27/16.

## 2016-02-05 ENCOUNTER — Encounter: Payer: Self-pay | Admitting: *Deleted

## 2016-02-06 ENCOUNTER — Telehealth: Payer: Self-pay | Admitting: *Deleted

## 2016-02-06 NOTE — Telephone Encounter (Signed)
Fax confirmation received. 

## 2016-02-06 NOTE — Telephone Encounter (Signed)
Pt wife calling to follow up on a question she had at patients wound check appointment.

## 2016-02-06 NOTE — Telephone Encounter (Signed)
Spoke with patient's wife.  Advised that I faxed a clearance letter to [place] (fax # [phone]) and mailed a copy to the patient's home address.  Reviewed lifting/pushing/pulling restrictions for left arm with patient's wife.  Advised that she can now follow-up with the PT office to schedule patient's next therapy appointment.  Patient's wife appreciative and denies additional questions or concerns at this time.  Spoke with April, PT assistant at [facility], and advised that I am faxing the clearance letter.  She states that she will make the front desk aware so that the patient can be scheduled.  Verbally reviewed left arm restrictions with April and she verbalizes understanding.

## 2016-02-12 DIAGNOSIS — H353211 Exudative age-related macular degeneration, right eye, with active choroidal neovascularization: Secondary | ICD-10-CM | POA: Diagnosis not present

## 2016-02-16 DIAGNOSIS — M25511 Pain in right shoulder: Secondary | ICD-10-CM | POA: Diagnosis not present

## 2016-02-16 DIAGNOSIS — M6281 Muscle weakness (generalized): Secondary | ICD-10-CM | POA: Diagnosis not present

## 2016-02-16 DIAGNOSIS — S42021D Displaced fracture of shaft of right clavicle, subsequent encounter for fracture with routine healing: Secondary | ICD-10-CM | POA: Diagnosis not present

## 2016-02-19 DIAGNOSIS — M6281 Muscle weakness (generalized): Secondary | ICD-10-CM | POA: Diagnosis not present

## 2016-02-19 DIAGNOSIS — S42021D Displaced fracture of shaft of right clavicle, subsequent encounter for fracture with routine healing: Secondary | ICD-10-CM | POA: Diagnosis not present

## 2016-02-19 DIAGNOSIS — M25511 Pain in right shoulder: Secondary | ICD-10-CM | POA: Diagnosis not present

## 2016-02-23 DIAGNOSIS — M6281 Muscle weakness (generalized): Secondary | ICD-10-CM | POA: Diagnosis not present

## 2016-02-23 DIAGNOSIS — S42021D Displaced fracture of shaft of right clavicle, subsequent encounter for fracture with routine healing: Secondary | ICD-10-CM | POA: Diagnosis not present

## 2016-02-23 DIAGNOSIS — M25511 Pain in right shoulder: Secondary | ICD-10-CM | POA: Diagnosis not present

## 2016-02-25 DIAGNOSIS — M6281 Muscle weakness (generalized): Secondary | ICD-10-CM | POA: Diagnosis not present

## 2016-02-25 DIAGNOSIS — M25511 Pain in right shoulder: Secondary | ICD-10-CM | POA: Diagnosis not present

## 2016-02-25 DIAGNOSIS — S42021D Displaced fracture of shaft of right clavicle, subsequent encounter for fracture with routine healing: Secondary | ICD-10-CM | POA: Diagnosis not present

## 2016-03-02 DIAGNOSIS — M6281 Muscle weakness (generalized): Secondary | ICD-10-CM | POA: Diagnosis not present

## 2016-03-02 DIAGNOSIS — S42021D Displaced fracture of shaft of right clavicle, subsequent encounter for fracture with routine healing: Secondary | ICD-10-CM | POA: Diagnosis not present

## 2016-03-02 DIAGNOSIS — M25511 Pain in right shoulder: Secondary | ICD-10-CM | POA: Diagnosis not present

## 2016-03-04 DIAGNOSIS — M6281 Muscle weakness (generalized): Secondary | ICD-10-CM | POA: Diagnosis not present

## 2016-03-04 DIAGNOSIS — S42021D Displaced fracture of shaft of right clavicle, subsequent encounter for fracture with routine healing: Secondary | ICD-10-CM | POA: Diagnosis not present

## 2016-03-04 DIAGNOSIS — M25511 Pain in right shoulder: Secondary | ICD-10-CM | POA: Diagnosis not present

## 2016-03-10 DIAGNOSIS — M25511 Pain in right shoulder: Secondary | ICD-10-CM | POA: Diagnosis not present

## 2016-03-10 DIAGNOSIS — M6281 Muscle weakness (generalized): Secondary | ICD-10-CM | POA: Diagnosis not present

## 2016-03-10 DIAGNOSIS — S42021D Displaced fracture of shaft of right clavicle, subsequent encounter for fracture with routine healing: Secondary | ICD-10-CM | POA: Diagnosis not present

## 2016-03-11 ENCOUNTER — Other Ambulatory Visit: Payer: Self-pay

## 2016-03-11 DIAGNOSIS — L97511 Non-pressure chronic ulcer of other part of right foot limited to breakdown of skin: Secondary | ICD-10-CM | POA: Diagnosis not present

## 2016-03-11 MED ORDER — CARVEDILOL 3.125 MG PO TABS: 3.1250 mg | ORAL_TABLET | Freq: Two times a day (BID) | ORAL | 3 refills | Status: DC

## 2016-03-12 DIAGNOSIS — S42021D Displaced fracture of shaft of right clavicle, subsequent encounter for fracture with routine healing: Secondary | ICD-10-CM | POA: Diagnosis not present

## 2016-03-12 DIAGNOSIS — M6281 Muscle weakness (generalized): Secondary | ICD-10-CM | POA: Diagnosis not present

## 2016-03-12 DIAGNOSIS — M25511 Pain in right shoulder: Secondary | ICD-10-CM | POA: Diagnosis not present

## 2016-03-16 DIAGNOSIS — S42021D Displaced fracture of shaft of right clavicle, subsequent encounter for fracture with routine healing: Secondary | ICD-10-CM | POA: Diagnosis not present

## 2016-03-16 DIAGNOSIS — M6281 Muscle weakness (generalized): Secondary | ICD-10-CM | POA: Diagnosis not present

## 2016-03-16 DIAGNOSIS — M25511 Pain in right shoulder: Secondary | ICD-10-CM | POA: Diagnosis not present

## 2016-03-17 ENCOUNTER — Encounter: Payer: Self-pay | Admitting: Internal Medicine

## 2016-03-17 ENCOUNTER — Ambulatory Visit (INDEPENDENT_AMBULATORY_CARE_PROVIDER_SITE_OTHER): Payer: Medicare Other | Admitting: Internal Medicine

## 2016-03-17 VITALS — BP 130/71 | HR 58 | Temp 97.6°F | Wt 202.0 lb

## 2016-03-17 DIAGNOSIS — B965 Pseudomonas (aeruginosa) (mallei) (pseudomallei) as the cause of diseases classified elsewhere: Secondary | ICD-10-CM

## 2016-03-17 DIAGNOSIS — T8450XS Infection and inflammatory reaction due to unspecified internal joint prosthesis, sequela: Secondary | ICD-10-CM

## 2016-03-17 DIAGNOSIS — I2510 Atherosclerotic heart disease of native coronary artery without angina pectoris: Secondary | ICD-10-CM | POA: Diagnosis not present

## 2016-03-17 DIAGNOSIS — A498 Other bacterial infections of unspecified site: Secondary | ICD-10-CM

## 2016-03-17 NOTE — Progress Notes (Signed)
RFV: follow up on PsA prosthetic joint infection  Patient ID: Anthony Osborn, male   DOB: [birth year], 82 y.o.   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 He has been off of abtx > 1 month, where we are observing off of treatment. He had pacemaker placed since we last saw him, it was found that his syncopal spells were related to symptomatic bradycardia. He is doing well.  Outpatient Encounter Prescriptions as of 03/17/2016  Medication Sig  . acetaminophen (TYLENOL) 325 MG tablet Take 2 tablets (650 mg total) by mouth every 6 (six) hours as needed for mild pain (or Fever >/= 101).  Marland Kitchen aspirin EC 81 MG tablet Take 81 mg by mouth daily.  Marland Kitchen CALCIUM-MAGNESIUM-ZINC PO Take 1 tablet by mouth 3 (three) times daily. Reported on 12/15/2015  . carvedilol (COREG) 3.125 MG tablet Take 1 tablet (3.125 mg total) by mouth 2 (two) times daily.  . Cholecalciferol (VITAMIN D-3 PO) Take 1,000 Units by mouth daily.    . furosemide (LASIX) 40 MG tablet Take 1 tablet (40 mg total) by mouth daily.  Marland Kitchen lisinopril (PRINIVIL,ZESTRIL) 5 MG tablet Take 1 tablet (5 mg total) by mouth daily.  Marland Kitchen loratadine (CLARITIN) 10 MG tablet Take 10 mg by mouth at bedtime.  . methimazole (TAPAZOLE) 10 MG tablet Take 5 mg by mouth every other day.   . naphazoline-pheniramine (NAPHCON-A) 0.025-0.3 % ophthalmic solution Place 1 drop into both eyes at bedtime.  . sennosides-docusate sodium (SENOKOT-S) 8.6-50 MG tablet Take 1 tablet by mouth 2 (two) times daily.  . simvastatin (ZOCOR) 10 MG tablet Take 5 mg by mouth at bedtime.   . traMADol (ULTRAM) 50 MG tablet Take 1 tablet (50 mg total) by mouth every 6 (six) hours as needed for moderate pain.   No facility-administered encounter medications on file as of 03/17/2016.      Patient Active Problem List   Diagnosis Date Noted  . Cardiomyopathy, ischemic-EF 25-30% 01/24/2016    . Pacemaker (St Jude) implanted 01/23/16 01/24/2016  . Syncope 01/22/2016  . Third degree AV block ([place]) 01/22/2016  . Pseudomonas infection   . DVT (deep venous thrombosis) ([place]) 04/27/2015  . Pyogenic arthritis of right knee joint ([place])   . Essential hypertension   . Encounter for palliative care   . Hx of CABG 2012 04/21/2015  . Septic arthritis of knee, right ([place]) 04/21/2015  . Cough 04/21/2015  . Hyperlipidemia   . Prostate cancer ([place])   . Hyperthyroidism      Health Maintenance Due  Topic Date Due  . TETANUS/TDAP  10/31/1940  . ZOSTAVAX  10/31/1981  . INFLUENZA VACCINE  01/06/2016     Review of Systems Review of Systems  Constitutional: Negative for fever, chills, diaphoresis, activity change, appetite change, fatigue and unexpected weight change.  HENT: Negative for congestion, sore throat, rhinorrhea, sneezing, trouble swallowing and sinus pressure.  Eyes: Negative for photophobia and visual disturbance.  Respiratory: Negative for cough, chest tightness, shortness of breath, wheezing and stridor.  Cardiovascular: Negative for chest pain, palpitations and leg swelling.  Gastrointestinal: Negative for nausea, vomiting, abdominal pain, diarrhea, constipation, blood in stool, abdominal distention and anal bleeding.  Genitourinary: Negative for dysuria, hematuria, flank pain and difficulty urinating.  Musculoskeletal: Negative for myalgias, back pain, joint swelling, arthralgias and gait problem.  Skin: Negative for color change, pallor, rash and wound.  Neurological: Negative  for dizziness, tremors, weakness and light-headedness.  Hematological: Negative for adenopathy. Does not bruise/bleed easily.  Psychiatric/Behavioral: Negative for behavioral problems, confusion, sleep disturbance, dysphoric mood, decreased concentration and agitation.    Physical Exam   BP 130/71   Pulse (!) 58   Temp 97.6 F (36.4 C) (Oral)   Wt 91.6 kg (202 lb)   BMI 27.40 kg/m    Physical Exam  Constitutional: He is oriented to person, place, and time. He appears well-developed and well-nourished. No distress.  HENT:  Mouth/Throat: Oropharynx is clear and moist. No oropharyngeal exudate.  Cardiovascular: Normal rate, regular rhythm and normal heart sounds. Exam reveals no gallop and no friction rub.  No murmur heard.  Pulmonary/Chest: Effort normal and breath sounds normal. No respiratory distress. He has no wheezes.  Abdominal: Soft. Bowel sounds are normal. He exhibits no distension. There is no tenderness.  Lymphadenopathy:  He has no cervical adenopathy.  Neurological: He is alert and oriented to person, place, and time.  Skin: Skin is warm and dry. No rash noted. No erythema.  Psychiatric: He has a normal mood and affect. His behavior is normal.    CBC Lab Results  Component Value Date   WBC 6.8 01/23/2016   RBC 4.33 01/23/2016   HGB 13.5 01/23/2016   HCT 41.3 01/23/2016   PLT 165 01/23/2016   MCV 95.4 01/23/2016   MCH 31.2 01/23/2016   MCHC 32.7 01/23/2016   RDW 14.4 01/23/2016   LYMPHSABS 2,002 12/15/2015   MONOABS 770 12/15/2015   EOSABS 693 (H) 12/15/2015   BASOSABS 0 12/15/2015   BMET Lab Results  Component Value Date   NA 137 01/23/2016   K 4.0 01/23/2016   CL 105 01/23/2016   CO2 25 01/23/2016   GLUCOSE 76 01/23/2016   BUN 16 01/23/2016   CREATININE 0.77 01/23/2016   CALCIUM 9.1 01/23/2016   GFRNONAA >60 01/23/2016   GFRAA >60 01/23/2016   Lab Results  Component Value Date   ESRSEDRATE 4 03/17/2016   Lab Results  Component Value Date   CRP 0.5 12/15/2015     Assessment and Plan  I staged revision for PsA PJI = being observed off of tx. Will check sed rate and crp. Which have normalized. No further abtx are warranted in his case. rtc as neeed

## 2016-03-18 DIAGNOSIS — M25511 Pain in right shoulder: Secondary | ICD-10-CM | POA: Diagnosis not present

## 2016-03-18 DIAGNOSIS — M6281 Muscle weakness (generalized): Secondary | ICD-10-CM | POA: Diagnosis not present

## 2016-03-18 DIAGNOSIS — S42021D Displaced fracture of shaft of right clavicle, subsequent encounter for fracture with routine healing: Secondary | ICD-10-CM | POA: Diagnosis not present

## 2016-03-18 LAB — SEDIMENTATION RATE: Sed Rate: 4 mm/hr (ref 0–20)

## 2016-03-23 DIAGNOSIS — M6281 Muscle weakness (generalized): Secondary | ICD-10-CM | POA: Diagnosis not present

## 2016-03-23 DIAGNOSIS — L821 Other seborrheic keratosis: Secondary | ICD-10-CM | POA: Diagnosis not present

## 2016-03-23 DIAGNOSIS — L578 Other skin changes due to chronic exposure to nonionizing radiation: Secondary | ICD-10-CM | POA: Diagnosis not present

## 2016-03-23 DIAGNOSIS — L570 Actinic keratosis: Secondary | ICD-10-CM | POA: Diagnosis not present

## 2016-03-23 DIAGNOSIS — S42021D Displaced fracture of shaft of right clavicle, subsequent encounter for fracture with routine healing: Secondary | ICD-10-CM | POA: Diagnosis not present

## 2016-03-23 DIAGNOSIS — M25511 Pain in right shoulder: Secondary | ICD-10-CM | POA: Diagnosis not present

## 2016-03-24 DIAGNOSIS — S42021A Displaced fracture of shaft of right clavicle, initial encounter for closed fracture: Secondary | ICD-10-CM | POA: Diagnosis not present

## 2016-04-01 DIAGNOSIS — H353212 Exudative age-related macular degeneration, right eye, with inactive choroidal neovascularization: Secondary | ICD-10-CM | POA: Diagnosis not present

## 2016-04-02 DIAGNOSIS — Z23 Encounter for immunization: Secondary | ICD-10-CM | POA: Diagnosis not present

## 2016-04-12 DIAGNOSIS — C61 Malignant neoplasm of prostate: Secondary | ICD-10-CM | POA: Diagnosis not present

## 2016-04-19 DIAGNOSIS — C61 Malignant neoplasm of prostate: Secondary | ICD-10-CM | POA: Diagnosis not present

## 2016-04-20 DIAGNOSIS — L97511 Non-pressure chronic ulcer of other part of right foot limited to breakdown of skin: Secondary | ICD-10-CM | POA: Diagnosis not present

## 2016-04-20 DIAGNOSIS — S90211A Contusion of right great toe with damage to nail, initial encounter: Secondary | ICD-10-CM | POA: Diagnosis not present

## 2016-04-20 DIAGNOSIS — L600 Ingrowing nail: Secondary | ICD-10-CM | POA: Diagnosis not present

## 2016-04-27 ENCOUNTER — Ambulatory Visit (INDEPENDENT_AMBULATORY_CARE_PROVIDER_SITE_OTHER): Payer: Medicare Other | Admitting: Internal Medicine

## 2016-04-27 ENCOUNTER — Encounter (INDEPENDENT_AMBULATORY_CARE_PROVIDER_SITE_OTHER): Payer: Self-pay

## 2016-04-27 ENCOUNTER — Encounter: Payer: Self-pay | Admitting: Internal Medicine

## 2016-04-27 VITALS — BP 130/68 | HR 81 | Ht 72.0 in | Wt 205.6 lb

## 2016-04-27 DIAGNOSIS — I2510 Atherosclerotic heart disease of native coronary artery without angina pectoris: Secondary | ICD-10-CM | POA: Diagnosis not present

## 2016-04-27 DIAGNOSIS — Z4889 Encounter for other specified surgical aftercare: Secondary | ICD-10-CM | POA: Insufficient documentation

## 2016-04-27 DIAGNOSIS — Z950 Presence of cardiac pacemaker: Secondary | ICD-10-CM | POA: Diagnosis not present

## 2016-04-27 NOTE — Progress Notes (Signed)
HPI Mr. Anthony Osborn returns today for ongoing PPM evaluation. He is a pleasant 82 yo man with [place] syncope who underwent PPM insertion about 3 months ago. He has done well in the interim despite his advanced age. The patient has not had any more syncope. No chest pain or sob. He has minimal edema of the legs. No Known Allergies   Current Outpatient Prescriptions  Medication Sig Dispense Refill  . acetaminophen (TYLENOL) 325 MG tablet Take 2 tablets (650 mg total) by mouth every 6 (six) hours as needed for mild pain (or Fever >/= 101).    Marland Kitchen aspirin EC 81 MG tablet Take 81 mg by mouth daily.    Marland Kitchen CALCIUM-MAGNESIUM-ZINC PO Take 1 tablet by mouth 3 (three) times daily. Reported on 12/15/2015    . carvedilol (COREG) 3.125 MG tablet Take 1 tablet (3.125 mg total) by mouth 2 (two) times daily. 180 tablet 3  . Cholecalciferol (VITAMIN D-3 PO) Take 1,000 Units by mouth daily.      . furosemide (LASIX) 40 MG tablet Take 1 tablet (40 mg total) by mouth daily. 30 tablet 6  . lisinopril (PRINIVIL,ZESTRIL) 5 MG tablet Take 1 tablet (5 mg total) by mouth daily. 30 tablet 6  . loratadine (CLARITIN) 10 MG tablet Take 10 mg by mouth at bedtime.    . methimazole (TAPAZOLE) 5 MG tablet Take 2.5 mg by mouth every other day.    . naphazoline-pheniramine (NAPHCON-A) 0.025-0.3 % ophthalmic solution Place 1 drop into both eyes at bedtime.    . sennosides-docusate sodium (SENOKOT-S) 8.6-50 MG tablet Take 1 tablet by mouth 2 (two) times daily.    . simvastatin (ZOCOR) 10 MG tablet Take 5 mg by mouth at bedtime.      No current facility-administered medications for this visit.      Past Medical History:  Diagnosis Date  . CAD (coronary artery disease)   . Depression    "after 1st wife died"  . DVT (deep venous thrombosis) ([place]) 04/2015  . Hyperlipidemia   . Hypertension   . Hyperthyroidism   . Ischemic cardiomyopathy   . Prostate cancer ([place])    3 YEARS AGO(BEING OBSERVED)  . Pulmonary embolism  ([place]) 04/2015    ROS:   All systems reviewed and negative except as noted in the HPI.   Past Surgical History:  Procedure Laterality Date  . CARDIAC CATHETERIZATION N/A 01/22/2016   Procedure: Temporary Pacemaker;  Surgeon: Belva Crome, MD;  Location: [facility];  Service: Cardiovascular;  Laterality: N/A;  . CATARACT EXTRACTION W/ INTRAOCULAR LENS  IMPLANT, BILATERAL Bilateral   . CORONARY ARTERY BYPASS GRAFT  03/2011   triple  . EP IMPLANTABLE DEVICE N/A 01/23/2016   Procedure: Pacemaker Implant;  Surgeon: Evans Lance, MD;  Location: [facility];  Service: Cardiovascular;  Laterality: N/A;  . INCISION AND DRAINAGE Right 04/28/2015   Procedure: INCISION AND DRAINAGE;  Surgeon: Vickey Huger, MD;  Location: [facility];  Service: Orthopedics;  Laterality: Right;  . JOINT REPLACEMENT    . LAPAROSCOPIC CHOLECYSTECTOMY    . PROSTATE BIOPSY  ~ 2010  . SHOULDER OPEN ROTATOR CUFF REPAIR Left   . TOTAL KNEE ARTHROPLASTY Bilateral   . TOTAL KNEE REVISION Right 04/28/2015   Procedure: TOTAL KNEE REVISION;  Surgeon: Vickey Huger, MD;  Location: [facility];  Service: Orthopedics;  Laterality: Right;     Family History  Problem Relation Age of Onset  . Stroke Father   .  Heart disease Brother   . Diabetes Sister      Social History   Social History  . Marital status: Married    Spouse name: N/A  . Number of children: N/A  . Years of education: N/A   Occupational History  . Not on file.   Social History Main Topics  . Smoking status: Never Smoker  . Smokeless tobacco: Never Used  . Alcohol use No  . Drug use: No  . Sexual activity: No   Other Topics Concern  . Not on file   Social History Narrative  . No narrative on file     BP 130/68   Pulse 81   Ht 6' (1.829 m)   Wt 205 lb 9.6 oz (93.3 kg)   BMI 27.88 kg/m   Physical Exam:  Well appearing elderly man, NAD HEENT: Unremarkable Neck:  No JVD, no thyromegally Lymphatics:  No adenopathy Back:  No CVA  tenderness Lungs:  Clear with no wheezes HEART:  Regular rate rhythm, no murmurs, no rubs, no clicks Abd:  soft, positive bowel sounds, no organomegally, no rebound, no guarding Ext:  2 plus pulses, no edema, no cyanosis, no clubbing Skin:  No rashes no nodules Neuro:  CN II through XII intact, motor grossly intact  EKG - nsr with AV pacing  DEVICE  Normal device function.  See PaceArt for details.   Assess/Plan: 1. PPM - his St. Jude DDD PM is working normally. Will recheck in several months. 2. HTN - his blood pressure is controlled. He will continue his current meds. 3. CAD - he denies anginal symptoms. No change in his meds  Mikle Bosworth.D.

## 2016-04-27 NOTE — Patient Instructions (Signed)
Medication Instructions:  Your physician recommends that you continue on your current medications as directed. Please refer to the Current Medication list given to you today.   Labwork: None Ordered   Testing/Procedures: None Ordered   Follow-Up: Your physician wants you to follow-up in: 9 months with Dr. Lovena Le. You will receive a reminder letter in the mail two months in advance. If you don't receive a letter, please call our office to schedule the follow-up appointment.  Remote monitoring is used to monitor your Pacemaker from home. This monitoring reduces the number of office visits required to check your device to one time per year. It allows [place] to keep an eye on the functioning of your device to ensure it is working properly. You are scheduled for a device check from home on 07/27/16. You may send your transmission at any time that day. If you have a wireless device, the transmission will be sent automatically. After your physician reviews your transmission, you will receive a postcard with your next transmission date.    Any Other Special Instructions Will Be Listed Below (If Applicable).     If you need a refill on your cardiac medications before your next appointment, please call your pharmacy.

## 2016-05-04 DIAGNOSIS — W19XXXA Unspecified fall, initial encounter: Secondary | ICD-10-CM | POA: Diagnosis not present

## 2016-05-04 DIAGNOSIS — S50312A Abrasion of left elbow, initial encounter: Secondary | ICD-10-CM | POA: Diagnosis not present

## 2016-05-04 DIAGNOSIS — Y92009 Unspecified place in unspecified non-institutional (private) residence as the place of occurrence of the external cause: Secondary | ICD-10-CM | POA: Diagnosis not present

## 2016-05-05 DIAGNOSIS — S42021A Displaced fracture of shaft of right clavicle, initial encounter for closed fracture: Secondary | ICD-10-CM | POA: Diagnosis not present

## 2016-05-06 DIAGNOSIS — H353211 Exudative age-related macular degeneration, right eye, with active choroidal neovascularization: Secondary | ICD-10-CM | POA: Diagnosis not present

## 2016-05-10 ENCOUNTER — Other Ambulatory Visit: Payer: Self-pay | Admitting: Cardiovascular Disease

## 2016-05-11 LAB — CUP PACEART INCLINIC DEVICE CHECK
Implantable Lead Implant Date: 20170818
Implantable Lead Location: 753860
Lead Channel Impedance Value: 530 Ohm
Lead Channel Pacing Threshold Amplitude: 0.625 V
Lead Channel Pacing Threshold Pulse Width: 0.4 ms
Lead Channel Pacing Threshold Pulse Width: 0.4 ms
Lead Channel Setting Pacing Amplitude: 0.875
Lead Channel Setting Pacing Amplitude: 2.5 V
Lead Channel Setting Pacing Pulse Width: 0.4 ms
Lead Channel Setting Sensing Sensitivity: 2 mV
MDC IDC LEAD IMPLANT DT: 20170818
MDC IDC LEAD LOCATION: 753859
MDC IDC MSMT LEADCHNL RA PACING THRESHOLD AMPLITUDE: 1.25 V
MDC IDC MSMT LEADCHNL RA SENSING INTR AMPL: 4.8 mV
MDC IDC MSMT LEADCHNL RV IMPEDANCE VALUE: 560 Ohm
MDC IDC MSMT LEADCHNL RV SENSING INTR AMPL: 10.3 mV
MDC IDC PG IMPLANT DT: 20170818
MDC IDC SESS DTM: 20171205095733
Pulse Gen Serial Number: 7939408

## 2016-05-13 DIAGNOSIS — L03116 Cellulitis of left lower limb: Secondary | ICD-10-CM | POA: Diagnosis not present

## 2016-05-13 DIAGNOSIS — R600 Localized edema: Secondary | ICD-10-CM | POA: Diagnosis not present

## 2016-05-16 ENCOUNTER — Inpatient Hospital Stay (HOSPITAL_COMMUNITY)
Admission: EM | Admit: 2016-05-16 | Discharge: 2016-05-18 | DRG: 603 | Disposition: A | Discharge status: Home / Self Care | Payer: Medicare Other | Attending: Internal Medicine | Admitting: Internal Medicine

## 2016-05-16 ENCOUNTER — Encounter (HOSPITAL_COMMUNITY): Payer: Self-pay | Admitting: *Deleted

## 2016-05-16 DIAGNOSIS — I82409 Acute embolism and thrombosis of unspecified deep veins of unspecified lower extremity: Secondary | ICD-10-CM | POA: Diagnosis present

## 2016-05-16 DIAGNOSIS — Z7982 Long term (current) use of aspirin: Secondary | ICD-10-CM

## 2016-05-16 DIAGNOSIS — E0590 Thyrotoxicosis, unspecified without thyrotoxic crisis or storm: Secondary | ICD-10-CM | POA: Diagnosis not present

## 2016-05-16 DIAGNOSIS — L03116 Cellulitis of left lower limb: Principal | ICD-10-CM

## 2016-05-16 DIAGNOSIS — Z951 Presence of aortocoronary bypass graft: Secondary | ICD-10-CM | POA: Diagnosis not present

## 2016-05-16 DIAGNOSIS — C61 Malignant neoplasm of prostate: Secondary | ICD-10-CM | POA: Diagnosis present

## 2016-05-16 DIAGNOSIS — Z86711 Personal history of pulmonary embolism: Secondary | ICD-10-CM

## 2016-05-16 DIAGNOSIS — Z96653 Presence of artificial knee joint, bilateral: Secondary | ICD-10-CM | POA: Diagnosis present

## 2016-05-16 DIAGNOSIS — Z8249 Family history of ischemic heart disease and other diseases of the circulatory system: Secondary | ICD-10-CM

## 2016-05-16 DIAGNOSIS — I2510 Atherosclerotic heart disease of native coronary artery without angina pectoris: Secondary | ICD-10-CM | POA: Diagnosis not present

## 2016-05-16 DIAGNOSIS — I110 Hypertensive heart disease with heart failure: Secondary | ICD-10-CM | POA: Diagnosis not present

## 2016-05-16 DIAGNOSIS — Z950 Presence of cardiac pacemaker: Secondary | ICD-10-CM

## 2016-05-16 DIAGNOSIS — Z86718 Personal history of other venous thrombosis and embolism: Secondary | ICD-10-CM

## 2016-05-16 DIAGNOSIS — L0390 Cellulitis, unspecified: Secondary | ICD-10-CM | POA: Diagnosis present

## 2016-05-16 DIAGNOSIS — I10 Essential (primary) hypertension: Secondary | ICD-10-CM | POA: Diagnosis present

## 2016-05-16 DIAGNOSIS — I5022 Chronic systolic (congestive) heart failure: Secondary | ICD-10-CM | POA: Diagnosis present

## 2016-05-16 DIAGNOSIS — Z823 Family history of stroke: Secondary | ICD-10-CM

## 2016-05-16 DIAGNOSIS — Z79899 Other long term (current) drug therapy: Secondary | ICD-10-CM

## 2016-05-16 DIAGNOSIS — E785 Hyperlipidemia, unspecified: Secondary | ICD-10-CM | POA: Diagnosis present

## 2016-05-16 DIAGNOSIS — I255 Ischemic cardiomyopathy: Secondary | ICD-10-CM | POA: Diagnosis present

## 2016-05-16 DIAGNOSIS — Z8546 Personal history of malignant neoplasm of prostate: Secondary | ICD-10-CM

## 2016-05-16 LAB — COMPREHENSIVE METABOLIC PANEL
ALT: 17 U/L (ref 17–63)
AST: 24 U/L (ref 15–41)
Albumin: 2.9 g/dL — ABNORMAL LOW (ref 3.5–5.0)
Alkaline Phosphatase: 100 U/L (ref 38–126)
Anion gap: 3 — ABNORMAL LOW (ref 5–15)
BUN: 19 mg/dL (ref 6–20)
CHLORIDE: 100 mmol/L — AB (ref 101–111)
CO2: 32 mmol/L (ref 22–32)
CREATININE: 0.9 mg/dL (ref 0.61–1.24)
Calcium: 8.9 mg/dL (ref 8.9–10.3)
GFR calc non Af Amer: >60 mL/min (ref >60)
Glucose, Bld: 110 mg/dL — ABNORMAL HIGH (ref 65–99)
POTASSIUM: 4.7 mmol/L (ref 3.5–5.1)
SODIUM: 135 mmol/L (ref 135–145)
Total Bilirubin: 1 mg/dL (ref 0.3–1.2)
Total Protein: 6 g/dL — ABNORMAL LOW (ref 6.5–8.1)

## 2016-05-16 LAB — CBC WITH DIFFERENTIAL/PLATELET
Basophils Absolute: 0 10*3/uL (ref 0.0–0.1)
Basophils Relative: 0 %
EOS ABS: 0.2 10*3/uL (ref 0.0–0.7)
EOS PCT: 2 %
HCT: 40.8 % (ref 39.0–52.0)
Hemoglobin: 13.6 g/dL (ref 13.0–17.0)
LYMPHS ABS: 2.2 10*3/uL (ref 0.7–4.0)
Lymphocytes Relative: 21 %
MCH: 31.4 pg (ref 26.0–34.0)
MCHC: 33.3 g/dL (ref 30.0–36.0)
MCV: 94.2 fL (ref 78.0–100.0)
MONO ABS: 1.3 10*3/uL — AB (ref 0.1–1.0)
MONOS PCT: 13 %
Neutro Abs: 6.7 10*3/uL (ref 1.7–7.7)
Neutrophils Relative %: 64 %
PLATELETS: 217 10*3/uL (ref 150–400)
RBC: 4.33 MIL/uL (ref 4.22–5.81)
RDW: 13.1 % (ref 11.5–15.5)
WBC: 10.4 10*3/uL (ref 4.0–10.5)

## 2016-05-16 LAB — I-STAT CG4 LACTIC ACID, ED: LACTIC ACID, VENOUS: 1.68 mmol/L (ref 0.5–1.9)

## 2016-05-16 MED ORDER — SODIUM CHLORIDE 0.9 % IV SOLN
1500.0000 mg | Freq: Once | INTRAVENOUS | Status: AC
  Administered 2016-05-16: 1500 mg via INTRAVENOUS
  Filled 2016-05-16: qty 1500

## 2016-05-16 MED ORDER — NAPHAZOLINE-PHENIRAMINE 0.025-0.3 % OP SOLN
1.0000 [drp] | Freq: Every day | OPHTHALMIC | Status: DC
  Administered 2016-05-16 – 2016-05-17 (×2): 1 [drp] via OPHTHALMIC
  Filled 2016-05-16: qty 5

## 2016-05-16 MED ORDER — CARVEDILOL 3.125 MG PO TABS
3.1250 mg | ORAL_TABLET | Freq: Two times a day (BID) | ORAL | Status: DC
  Administered 2016-05-16 – 2016-05-18 (×4): 3.125 mg via ORAL
  Filled 2016-05-16 (×4): qty 1

## 2016-05-16 MED ORDER — LISINOPRIL 5 MG PO TABS
5.0000 mg | ORAL_TABLET | Freq: Every day | ORAL | Status: DC
  Administered 2016-05-17 – 2016-05-18 (×2): 5 mg via ORAL
  Filled 2016-05-16 (×2): qty 1

## 2016-05-16 MED ORDER — LORATADINE 10 MG PO TABS
10.0000 mg | ORAL_TABLET | Freq: Every day | ORAL | Status: DC
  Administered 2016-05-16 – 2016-05-17 (×2): 10 mg via ORAL
  Filled 2016-05-16 (×2): qty 1

## 2016-05-16 MED ORDER — SENNOSIDES-DOCUSATE SODIUM 8.6-50 MG PO TABS
1.0000 | ORAL_TABLET | Freq: Two times a day (BID) | ORAL | Status: DC
  Administered 2016-05-16 – 2016-05-18 (×4): 1 via ORAL
  Filled 2016-05-16 (×4): qty 1

## 2016-05-16 MED ORDER — CEFAZOLIN SODIUM-DEXTROSE 2-4 GM/100ML-% IV SOLN
2.0000 g | Freq: Three times a day (TID) | INTRAVENOUS | Status: DC
  Administered 2016-05-16 – 2016-05-17 (×2): 2 g via INTRAVENOUS
  Filled 2016-05-16 (×4): qty 100

## 2016-05-16 MED ORDER — ACETAMINOPHEN 325 MG PO TABS: 650.0000 mg | ORAL_TABLET | Freq: Four times a day (QID) | ORAL | Status: DC | PRN

## 2016-05-16 MED ORDER — SODIUM CHLORIDE 0.9 % IV SOLN: 250.0000 mL | INTRAVENOUS | Status: DC | PRN

## 2016-05-16 MED ORDER — SIMVASTATIN 10 MG PO TABS
5.0000 mg | ORAL_TABLET | Freq: Every day | ORAL | Status: DC
  Administered 2016-05-16 – 2016-05-17 (×2): 5 mg via ORAL
  Filled 2016-05-16 (×2): qty 1

## 2016-05-16 MED ORDER — ASPIRIN EC 81 MG PO TBEC
81.0000 mg | DELAYED_RELEASE_TABLET | Freq: Every evening | ORAL | Status: DC
  Administered 2016-05-16 – 2016-05-17 (×2): 81 mg via ORAL
  Filled 2016-05-16 (×2): qty 1

## 2016-05-16 MED ORDER — FUROSEMIDE 40 MG PO TABS
40.0000 mg | ORAL_TABLET | Freq: Every day | ORAL | Status: DC
  Administered 2016-05-17 – 2016-05-18 (×2): 40 mg via ORAL
  Filled 2016-05-16 (×2): qty 1

## 2016-05-16 MED ORDER — VITAMIN D 1000 UNITS PO TABS
1000.0000 [IU] | ORAL_TABLET | Freq: Every day | ORAL | Status: DC
  Administered 2016-05-17 – 2016-05-18 (×2): 1000 [IU] via ORAL
  Filled 2016-05-16 (×2): qty 1

## 2016-05-16 MED ORDER — SODIUM CHLORIDE 0.9% FLUSH
3.0000 mL | Freq: Two times a day (BID) | INTRAVENOUS | Status: DC
  Administered 2016-05-16 – 2016-05-18 (×4): 3 mL via INTRAVENOUS

## 2016-05-16 MED ORDER — METHIMAZOLE 5 MG PO TABS
2.5000 mg | ORAL_TABLET | ORAL | Status: DC
  Administered 2016-05-18: 2.5 mg via ORAL
  Filled 2016-05-16: qty 1

## 2016-05-16 MED ORDER — SENNA-DOCUSATE SODIUM 8.6-50 MG PO TABS: 1.0000 | ORAL_TABLET | Freq: Two times a day (BID) | ORAL | Status: DC

## 2016-05-16 MED ORDER — VANCOMYCIN HCL 10 G IV SOLR: 1250.0000 mg | INTRAVENOUS | Status: DC

## 2016-05-16 MED ORDER — ENOXAPARIN SODIUM 40 MG/0.4ML ~~LOC~~ SOLN
40.0000 mg | SUBCUTANEOUS | Status: DC
  Administered 2016-05-16 – 2016-05-17 (×2): 40 mg via SUBCUTANEOUS
  Filled 2016-05-16 (×2): qty 0.4

## 2016-05-16 MED ORDER — SODIUM CHLORIDE 0.9% FLUSH: 3.0000 mL | INTRAVENOUS | Status: DC | PRN

## 2016-05-16 NOTE — Progress Notes (Signed)
Pharmacy Antibiotic Note Suleiman Vacanti s a 82 y.o. male admitted on 05/16/2016 with left leg swelling and redness that failed to respond to outpatient Keflex.  Pharmacy has been consulted for vancomycin dosing.  Plan: Vancomycin 1250 IV every 24 hours.  Goal trough 10-15 mcg/mL.   Height: 6' (182.9 cm) Weight: 205 lb (93 kg) IBW/kg (Calculated) : 77.6  Temp (24hrs), Avg:97.5 F (36.4 C), Min:97.5 F (36.4 C), Max:97.5 F (36.4 C)   Recent Labs Lab 05/16/16 1429 05/16/16 1443  WBC 10.4  --   CREATININE 0.90  --   LATICACIDVEN  --  1.68    Estimated Creatinine Clearance: 55.1 mL/min (by C-G formula based on SCr of 0.9 mg/dL).    No Known Allergies   Thank you for allowing pharmacy to be a part of this patient's care.  Vincenza Hews, PharmD, BCPS 05/16/2016, 3:22 PM Pager: [phone]

## 2016-05-16 NOTE — ED Provider Notes (Signed)
[facility] DEPT Provider Note   CSN: [id number] Arrival date & time: 05/16/16  1409     History   Chief Complaint Chief Complaint  Patient presents with  . Leg Pain    HPI Anthony Osborn is a 82 y.o. male. He lives at home with his wife. He is active. He walks most days. For the last 5 days he's had redness and swelling in his left lower leg. Seen by primary care on Thursday, was seen at a podiatry clinic on Friday. Negative Doppler ultrasound of his leg. As progressive redness pain and swelling of the left leg. Wednesday and Thursday had fever. Has not had fever since. Started on Keflex Thursday. Today is day 4. His had progressive redness and pain and swelling to the leg and he states he can barely walk on it because of the pain now.  HPI  Past Medical History:  Diagnosis Date  . CAD (coronary artery disease)   . Depression    "after 1st wife died"  . DVT (deep venous thrombosis) ([place]) 04/2015  . Hyperlipidemia   . Hypertension   . Hyperthyroidism   . Ischemic cardiomyopathy   . Prostate cancer ([place])    3 YEARS AGO(BEING OBSERVED)  . Pulmonary embolism ([place]) 04/2015    Patient Active Problem List   Diagnosis Date Noted  . Cardiomyopathy, ischemic-EF 25-30% 01/24/2016  . Pacemaker (St Jude) implanted 01/23/16 01/24/2016  . Syncope 01/22/2016  . Third degree AV block ([place]) 01/22/2016  . Pseudomonas infection   . DVT (deep venous thrombosis) ([place]) 04/27/2015  . Pyogenic arthritis of right knee joint ([place])   . Essential hypertension   . Encounter for palliative care   . Hx of CABG 2012 04/21/2015  . Septic arthritis of knee, right ([place]) 04/21/2015  . Cough 04/21/2015  . Hyperlipidemia   . Prostate cancer ([place])   . Hyperthyroidism     Past Surgical History:  Procedure Laterality Date  . CARDIAC CATHETERIZATION N/A 01/22/2016   Procedure: Temporary Pacemaker;  Surgeon: Belva Crome, MD;  Location: [facility];  Service: Cardiovascular;  Laterality: N/A;    . CATARACT EXTRACTION W/ INTRAOCULAR LENS  IMPLANT, BILATERAL Bilateral   . CORONARY ARTERY BYPASS GRAFT  03/2011   triple  . EP IMPLANTABLE DEVICE N/A 01/23/2016   Procedure: Pacemaker Implant;  Surgeon: Evans Lance, MD;  Location: [facility];  Service: Cardiovascular;  Laterality: N/A;  . INCISION AND DRAINAGE Right 04/28/2015   Procedure: INCISION AND DRAINAGE;  Surgeon: Vickey Huger, MD;  Location: [facility];  Service: Orthopedics;  Laterality: Right;  . JOINT REPLACEMENT    . LAPAROSCOPIC CHOLECYSTECTOMY    . PROSTATE BIOPSY  ~ 2010  . SHOULDER OPEN ROTATOR CUFF REPAIR Left   . TOTAL KNEE ARTHROPLASTY Bilateral   . TOTAL KNEE REVISION Right 04/28/2015   Procedure: TOTAL KNEE REVISION;  Surgeon: Vickey Huger, MD;  Location: [facility];  Service: Orthopedics;  Laterality: Right;       Home Medications    Prior to Admission medications   Medication Sig Start Date End Date Taking? Authorizing Provider  acetaminophen (TYLENOL) 325 MG tablet Take 2 tablets (650 mg total) by mouth every 6 (six) hours as needed for mild pain (or Fever >/= 101). 04/30/15   Shanker Kristeen Mans, MD  aspirin EC 81 MG tablet Take 81 mg by mouth daily.    Historical Provider, MD  CALCIUM-MAGNESIUM-ZINC PO Take 1 tablet by mouth 3 (three) times daily. Reported on  12/15/2015    Historical Provider, MD  carvedilol (COREG) 3.125 MG tablet Take 1 tablet (3.125 mg total) by mouth 2 (two) times daily. 03/11/16   Burnell Blanks, MD  Cholecalciferol (VITAMIN D-3 PO) Take 1,000 Units by mouth daily.      Historical Provider, MD  furosemide (LASIX) 40 MG tablet Take 1 tablet (40 mg total) by mouth daily. 09/22/15   Burnell Blanks, MD  lisinopril (PRINIVIL,ZESTRIL) 5 MG tablet TAKE ONE TABLET BY MOUTH ONCE DAILY 05/11/16   Burnell Blanks, MD  loratadine (CLARITIN) 10 MG tablet Take 10 mg by mouth at bedtime.    Historical Provider, MD  methimazole (TAPAZOLE) 5 MG tablet Take 2.5 mg by mouth every other  day.    Historical Provider, MD  naphazoline-pheniramine (NAPHCON-A) 0.025-0.3 % ophthalmic solution Place 1 drop into both eyes at bedtime.    Historical Provider, MD  sennosides-docusate sodium (SENOKOT-S) 8.6-50 MG tablet Take 1 tablet by mouth 2 (two) times daily.    Historical Provider, MD  simvastatin (ZOCOR) 10 MG tablet Take 5 mg by mouth at bedtime.     Historical Provider, MD    Family History Family History  Problem Relation Age of Onset  . Stroke Father   . Heart disease Brother   . Diabetes Sister     Social History Social History  Substance Use Topics  . Smoking status: Never Smoker  . Smokeless tobacco: Never Used  . Alcohol use No     Allergies   Patient has no known allergies.   Review of Systems Review of Systems  Constitutional: Negative for appetite change, chills, diaphoresis, fatigue and fever.  HENT: Negative for mouth sores, sore throat and trouble swallowing.   Eyes: Negative for visual disturbance.  Respiratory: Negative for cough, chest tightness, shortness of breath and wheezing.   Cardiovascular: Negative for chest pain.  Gastrointestinal: Negative for abdominal distention, abdominal pain, diarrhea, nausea and vomiting.  Endocrine: Negative for polydipsia, polyphagia and polyuria.  Genitourinary: Negative for dysuria, frequency and hematuria.  Musculoskeletal: Negative for gait problem.  Skin: Positive for color change and rash. Negative for pallor.  Neurological: Negative for dizziness, syncope, light-headedness and headaches.  Hematological: Does not bruise/bleed easily.  Psychiatric/Behavioral: Negative for behavioral problems and confusion.     Physical Exam Updated Vital Signs BP 106/67   Pulse 80   Temp 97.5 F (36.4 C) (Oral)   Resp 18   Ht 6' (1.829 m)   Wt 205 lb (93 kg)   SpO2 97%   BMI 27.80 kg/m   Physical Exam  Constitutional: He is oriented to person, place, and time. He appears well-developed and well-nourished. No  distress.  HENT:  Head: Normocephalic.  Eyes: Conjunctivae are normal. Pupils are equal, round, and reactive to light. No scleral icterus.  Neck: Normal range of motion. Neck supple. No thyromegaly present.  Cardiovascular: Normal rate and regular rhythm.  Exam reveals no gallop and no friction rub.   No murmur heard. Pulmonary/Chest: Effort normal and breath sounds normal. No respiratory distress. He has no wheezes. He has no rales.  Abdominal: Soft. Bowel sounds are normal. He exhibits no distension. There is no tenderness. There is no rebound.  Musculoskeletal: Normal range of motion.  Neurological: He is alert and oriented to person, place, and time.  Skin: Skin is warm and dry. No rash noted.  Soft tissue swelling redness and warmth to the left lower leg from the knee downward. Has thickened toenails more so on  the great toe. Has a ulcer on the lateral aspect of the left foot overlying the fifth metatarsal head.  Psychiatric: He has a normal mood and affect. His behavior is normal.     ED Treatments / Results  Labs (all labs ordered are listed, but only abnormal results are displayed) Labs Reviewed  COMPREHENSIVE METABOLIC PANEL - Abnormal; Notable for the following:       Result Value   Chloride 100 (*)    Glucose, Bld 110 (*)    Total Protein 6.0 (*)    Albumin 2.9 (*)    Anion gap 3 (*)    All other components within normal limits  CBC WITH DIFFERENTIAL/PLATELET - Abnormal; Notable for the following:    Monocytes Absolute 1.3 (*)    All other components within normal limits  CULTURE, BLOOD (ROUTINE X 2)  CULTURE, BLOOD (ROUTINE X 2)  I-STAT CG4 LACTIC ACID, ED    EKG  EKG Interpretation None       Radiology No results found.  Procedures Procedures (including critical care time)  Medications Ordered in ED Medications  vancomycin (VANCOCIN) 1,500 mg in sodium chloride 0.9 % 500 mL IVPB (not administered)  vancomycin (VANCOCIN) 1,250 mg in sodium chloride 0.9  % 250 mL IVPB (not administered)     Initial Impression / Assessment and Plan / ED Course  I have reviewed the triage vital signs and the nursing notes.  Pertinent labs & imaging results that were available during my care of the patient were reviewed by me and considered in my medical decision making (see chart for details).  Clinical Course     Patient has failed 4 days outpatient cephalexin for cellulitis and symptoms are progressing. Had negative Doppler. Plan will be cultures, vancomycin. We'll discussed hospitalist regarding inpatient admission for IV antibiotics  Final Clinical Impressions(s) / ED Diagnoses   Final diagnoses:  Cellulitis of left lower extremity    New Prescriptions New Prescriptions   No medications on file     Tanna Furry, MD 05/16/16 1523

## 2016-05-16 NOTE — ED Triage Notes (Addendum)
Pt having left leg swelling and redness since wed. Denies injury. Moderate swelling and redness noted. Denies fever. Started cephalexin on thurs evening.

## 2016-05-16 NOTE — Progress Notes (Signed)
Patient arrived to unit, oriented to unit and room, vitals stable, vancomycin running, IV site red, notified provider.  Betha Loa Courtnay Petrilla, RN

## 2016-05-16 NOTE — H&P (Signed)
Date: 05/16/2016               Patient Name:  Anthony Osborn MRN: [id number]  DOB: [birth year] Age / Sex: 82 y.o., male   PCP: Cyndy Freeze, MD         Medical Service: Internal Medicine Teaching Service         Attending Physician: Dr. Aldine Contes, MD    First Contact: Dr. Heber Sappington  Pager: [phone]  Second Contact: Dr. Charlynn Grimes Pager: [phone]       After Hours (After 5p/  First Contact Pager: [phone]  weekends / holidays): Second Contact Pager: [phone]   Chief Complaint: Left leg cellulitis   History of Present Illness: Mr. Anthony Osborn is a 82 year old gentleman with history of CAD/p triple-vessel CABG, Chronic Systolic CHF-EF by echo [id number] on 09/22/2015, Essential hypertension, and hyperthyroidism that presents to the ED for 4 day history of left leg swelling and redness.  Patient reports going to podiatry on 05/13/16 for painful left great toenail.  At that visit he stated that he had developed left leg redness and swelling for 1 day and was placed on Keflex at that visit.  He states he finished his antibiotic course and was told to go to the ED if redness did not improve. He currently denies pain to the area unless he lets his left leg hang over the side of the bed and if he bears weight on the leg.  He states this has never happened before and does not have a history of lower extremity swelling.  He denies trauma to the area.  Patient denies Fever/chills, nausea/vomiting, chest pain, shortness of breath or calf tenderness.    Meds:  Current Meds  Medication Sig  . acetaminophen (TYLENOL) 325 MG tablet Take 2 tablets (650 mg total) by mouth every 6 (six) hours as needed for mild pain (or Fever >/= 101).  Marland Kitchen aspirin EC 81 MG tablet Take 81 mg by mouth every evening.   Marland Kitchen CALCIUM-MAGNESIUM-ZINC PO Take 1 tablet by mouth 3 (three) times daily. Reported on 12/15/2015  . carvedilol (COREG) 3.125 MG tablet Take 1 tablet (3.125 mg total) by mouth 2 (two) times daily.  . cephALEXin  (KEFLEX) 500 MG capsule Take 500 mg by mouth 4 (four) times daily. Take for 10 days Started on 05-13-16  . Cholecalciferol (VITAMIN D-3 PO) Take 1,000 Units by mouth daily.    . furosemide (LASIX) 40 MG tablet Take 1 tablet (40 mg total) by mouth daily.  Marland Kitchen lisinopril (PRINIVIL,ZESTRIL) 5 MG tablet TAKE ONE TABLET BY MOUTH ONCE DAILY  . loratadine (CLARITIN) 10 MG tablet Take 10 mg by mouth at bedtime.  . methimazole (TAPAZOLE) 5 MG tablet Take 2.5 mg by mouth every other day.  . Multiple Vitamins-Minerals (ICAPS) CAPS Take 2 capsules by mouth daily.  . naphazoline-pheniramine (NAPHCON-A) 0.025-0.3 % ophthalmic solution Place 1 drop into both eyes at bedtime.  . sennosides-docusate sodium (SENOKOT-S) 8.6-50 MG tablet Take 1 tablet by mouth 2 (two) times daily.  . simvastatin (ZOCOR) 10 MG tablet Take 5 mg by mouth at bedtime.      Allergies: Allergies as of 05/16/2016  . (No Known Allergies)   Past Medical History:  Diagnosis Date  . CAD (coronary artery disease)   . Depression    "after 1st wife died"  . DVT (deep venous thrombosis) ([place]) 04/2015  . Hyperlipidemia   . Hypertension   . Hyperthyroidism   . Ischemic cardiomyopathy   .  Prostate cancer ([place])    3 YEARS AGO(BEING OBSERVED)  . Pulmonary embolism ([place]) 04/2015    Family History:  Family History  Problem Relation Age of Onset  . Stroke Father   . Heart disease Brother   . Diabetes Sister    Social History: Tobacco use: Never smoker Alcohol use: denies Illicit drug use: denies  Review of Systems: A complete ROS was negative except as per HPI.   Physical Exam: Blood pressure 112/55, pulse 71, temperature 97.5 F (36.4 C), temperature source Oral, resp. rate 18, height 6' (1.829 m), weight 205 lb (93 kg), SpO2 99 %. Vitals:   05/16/16 1500 05/16/16 1530 05/16/16 1600 05/16/16 1704  BP: 106/67 (!) 109/47 112/55 101/73  Pulse: 80 71 71 76  Resp: 18   19  Temp:    97.3 F (36.3 C)  TempSrc:    Oral  SpO2: 97%  99% 99% 97%  Weight:    206 lb 6.4 oz (93.6 kg)  Height:    6' (1.829 m)   General: Vital signs reviewed.  Patient is well-developed and well-nourished, in no acute distress and cooperative with exam.  Head: Normocephalic and atraumatic. Eyes: EOMI, conjunctivae normal, no scleral icterus.  Neck: Supple, trachea midline, normal ROM  Cardiovascular: RRR, S1 normal, S2 normal, no murmurs, gallops, or rubs. Pulmonary/Chest: Clear to auscultation bilaterally, no wheezes, rales, or rhonchi. Abdominal: Soft, non-tender, non-distended, BS +, no masses, organomegaly, or guarding present.  Musculoskeletal: No joint deformities, erythema, or stiffness, ROM full and nontender. Extremities: Erythema noted in the left leg from the ankle to upper shin,  pulses 2+ in right foot and faint in left foot. No cyanosis or clubbing. Neurological: A&O x3, no focal motor deficit, sensory intact to light touch bilaterally.  Psychiatric: Normal mood and affect. speech and behavior is normal. Cognition and memory are normal.  Skin: Warm, dry and intact. No rashes or erythema.      CBC    Component Value Date/Time   WBC 10.4 05/16/2016 1429   RBC 4.33 05/16/2016 1429   HGB 13.6 05/16/2016 1429   HCT 40.8 05/16/2016 1429   PLT 217 05/16/2016 1429   MCV 94.2 05/16/2016 1429   MCH 31.4 05/16/2016 1429   MCHC 33.3 05/16/2016 1429   RDW 13.1 05/16/2016 1429   LYMPHSABS 2.2 05/16/2016 1429   MONOABS 1.3 (H) 05/16/2016 1429   EOSABS 0.2 05/16/2016 1429   BASOSABS 0.0 05/16/2016 1429   BMET    Component Value Date/Time   NA 135 05/16/2016 1429   K 4.7 05/16/2016 1429   CL 100 (L) 05/16/2016 1429   CO2 32 05/16/2016 1429   GLUCOSE 110 (H) 05/16/2016 1429   BUN 19 05/16/2016 1429   CREATININE 0.90 05/16/2016 1429   CREATININE 0.75 12/15/2015 1636   CALCIUM 8.9 05/16/2016 1429   GFRNONAA >60 05/16/2016 1429   GFRAA >60 05/16/2016 1429    EKG: none  CXR: none  Assessment & Plan by Problem: Active  Problems:  Cellulitis Patient visited podiatry on 12/7 and was started on Keflex for cellulitis of the left lower extremity.  He reports compliance with keflex without benefit, but has only taken 3 days.  Today he returns because the redness has not improved.  He denies pain, fever or chills.  Patient is afebrile with no leukocytosis.  Leg is warm to the touch with mild swelling.  Current appearence is consistent with cellulitis.  Will give IV antibiotics inpatient.   - IV Ancef  History of provoked DVT During the hospital course on 04/21/15 for septic arthritis of right knee patient was found to have bilateral DVTs s/p right excision arthroplasty.  He was on Xarelto for 3 months for provoked DVT.  Today there is no calf tenderness on exam but with history of DVT and current presentation will obtain left lower extremity doppler. - left lower extremity Dopplers    CAD/p triple-vessel CABG on 03/17/2011 Patient does not complain of chest pain or shortness of breath. -continue ASA/Statin and Coreg.    Chronic Systolic CHF-EF by echo [id number] on 09/22/2015 -Continue carvedilol 3.125mg  BID - Continue Lasix 40mg  daiy  Hyperlipidemia -continue simvastatin  Hyperthyroidism No complaints of heat intolerance, dizziness or palpitations. - continue methimazole.  TSH was 2.6 on 01/22/2016  Essential hypertension Stable at 112/55 - continue lisinopril 5mg    Diet: Heart healthy Code status: full code DVT prophylaxis: lovenox subq   Dispo: Admit patient to Observation with expected length of stay less than 2 midnights.  Signed: Valinda Party, DO 05/16/2016, 4:39 PM  Pager: [phone]

## 2016-05-17 ENCOUNTER — Observation Stay (HOSPITAL_COMMUNITY): Payer: Medicare Other

## 2016-05-17 DIAGNOSIS — Z86711 Personal history of pulmonary embolism: Secondary | ICD-10-CM | POA: Diagnosis not present

## 2016-05-17 DIAGNOSIS — Z79899 Other long term (current) drug therapy: Secondary | ICD-10-CM | POA: Diagnosis not present

## 2016-05-17 DIAGNOSIS — L03116 Cellulitis of left lower limb: Secondary | ICD-10-CM | POA: Diagnosis not present

## 2016-05-17 DIAGNOSIS — Z950 Presence of cardiac pacemaker: Secondary | ICD-10-CM | POA: Diagnosis not present

## 2016-05-17 DIAGNOSIS — I255 Ischemic cardiomyopathy: Secondary | ICD-10-CM | POA: Diagnosis present

## 2016-05-17 DIAGNOSIS — Z7982 Long term (current) use of aspirin: Secondary | ICD-10-CM | POA: Diagnosis not present

## 2016-05-17 DIAGNOSIS — I2510 Atherosclerotic heart disease of native coronary artery without angina pectoris: Secondary | ICD-10-CM

## 2016-05-17 DIAGNOSIS — Z96653 Presence of artificial knee joint, bilateral: Secondary | ICD-10-CM | POA: Diagnosis present

## 2016-05-17 DIAGNOSIS — I5022 Chronic systolic (congestive) heart failure: Secondary | ICD-10-CM

## 2016-05-17 DIAGNOSIS — E0590 Thyrotoxicosis, unspecified without thyrotoxic crisis or storm: Secondary | ICD-10-CM

## 2016-05-17 DIAGNOSIS — Z8546 Personal history of malignant neoplasm of prostate: Secondary | ICD-10-CM | POA: Diagnosis not present

## 2016-05-17 DIAGNOSIS — E785 Hyperlipidemia, unspecified: Secondary | ICD-10-CM | POA: Diagnosis present

## 2016-05-17 DIAGNOSIS — Z8249 Family history of ischemic heart disease and other diseases of the circulatory system: Secondary | ICD-10-CM | POA: Diagnosis not present

## 2016-05-17 DIAGNOSIS — Z951 Presence of aortocoronary bypass graft: Secondary | ICD-10-CM

## 2016-05-17 DIAGNOSIS — B9689 Other specified bacterial agents as the cause of diseases classified elsewhere: Secondary | ICD-10-CM

## 2016-05-17 DIAGNOSIS — Z86718 Personal history of other venous thrombosis and embolism: Secondary | ICD-10-CM | POA: Diagnosis not present

## 2016-05-17 DIAGNOSIS — M79609 Pain in unspecified limb: Secondary | ICD-10-CM | POA: Diagnosis not present

## 2016-05-17 DIAGNOSIS — I110 Hypertensive heart disease with heart failure: Secondary | ICD-10-CM | POA: Diagnosis not present

## 2016-05-17 DIAGNOSIS — Z823 Family history of stroke: Secondary | ICD-10-CM | POA: Diagnosis not present

## 2016-05-17 LAB — BASIC METABOLIC PANEL
ANION GAP: 9 (ref 5–15)
BUN: 16 mg/dL (ref 6–20)
CHLORIDE: 102 mmol/L (ref 101–111)
CO2: 25 mmol/L (ref 22–32)
CREATININE: 0.78 mg/dL (ref 0.61–1.24)
Calcium: 8.5 mg/dL — ABNORMAL LOW (ref 8.9–10.3)
GFR calc non Af Amer: >60 mL/min (ref >60)
Glucose, Bld: 99 mg/dL (ref 65–99)
POTASSIUM: 4 mmol/L (ref 3.5–5.1)
SODIUM: 136 mmol/L (ref 135–145)

## 2016-05-17 LAB — CBC
HEMATOCRIT: 38 % — AB (ref 39.0–52.0)
HEMOGLOBIN: 12.3 g/dL — AB (ref 13.0–17.0)
MCH: 30.5 pg (ref 26.0–34.0)
MCHC: 32.4 g/dL (ref 30.0–36.0)
MCV: 94.3 fL (ref 78.0–100.0)
PLATELETS: 212 10*3/uL (ref 150–400)
RBC: 4.03 MIL/uL — AB (ref 4.22–5.81)
RDW: 13.5 % (ref 11.5–15.5)
WBC: 9.3 10*3/uL (ref 4.0–10.5)

## 2016-05-17 MED ORDER — DOXYCYCLINE HYCLATE 100 MG IV SOLR
100.0000 mg | Freq: Two times a day (BID) | INTRAVENOUS | Status: DC
  Administered 2016-05-17 (×2): 100 mg via INTRAVENOUS
  Filled 2016-05-17 (×5): qty 100

## 2016-05-17 NOTE — Progress Notes (Signed)
   Subjective: Patient was evaluated this morning on rounds. He reports stinging pain in his left leg overnight.  Patient denies fever or chills, shortness of breath or chest pain.  Objective:  Vital signs in last 24 hours: Vitals:   05/16/16 2251 05/17/16 0458 05/17/16 0500 05/17/16 0850  BP: 123/63 128/71  (!) 149/73  Pulse: 77 (!) 45  81  Resp: 18 18    Temp: 98.1 F (36.7 C) 97.2 F (36.2 C)    TempSrc: Oral Oral    SpO2: 95% 93%    Weight:   204 lb 1.6 oz (92.6 kg)   Height:       Physical Exam  Constitutional: He is well-developed, well-nourished, and in no distress.  Cardiovascular: Normal rate, regular rhythm and normal heart sounds.  Exam reveals no gallop and no friction rub.   No murmur heard. Pulmonary/Chest: Effort normal and breath sounds normal. No respiratory distress. He has no wheezes. He has no rales.  Abdominal: Soft. He exhibits no distension. There is no tenderness.  Skin:  Erythema and swelling from ankle to mid shin on left leg ~unchanged from yesterday     Assessment/Plan:  Active Problems:   Prostate cancer ([place])   Hx of CABG 2012   Hyperthyroidism   Essential hypertension   DVT (deep venous thrombosis) ([facility])   Cardiomyopathy, ischemic-EF 25-30%   Pacemaker (St Jude) implanted 01/23/16   Cellulitis  Cellulitis Patient was started on Ancef yesterday and erythema and swelling has not improved.  He denies pain, fever or chills.  Patient is afebrile with no leukocytosis.  Leg is still warm to the touch.  Will start IV doxycycline to have better coverage for MRSA.  If patient shows improvement can switch to oral medications tomorrow. If patient does not improve may need to try IV ceftaroline.  Will have PT eval and treat as patient states he enjoys walking but is having difficulty with current situation. - IV Doxycycline - PT eval and treat  History of provoked DVT Patient had a left lower duplex completed that showed no evidence of deep vein  thrombosis or baker's cyst.   CAD/p triple-vessel CABG on 03/17/2011 Patient does not complain of chest pain or shortness of breath. -continue ASA/Statin and Coreg.    Chronic Systolic CHF-EF by echo [id number] on 09/22/2015 -Continue carvedilol 3.125mg  BID - Continue Lasix 40mg  daiy  Hyperlipidemia -continue simvastatin  Hyperthyroidism No complaints of heat intolerance, dizziness or palpitations. - continue methimazole.  TSH was 2.6 on 01/22/2016  Essential hypertension Stable at 128/71 - continue lisinopril 5mg    Diet: Heart healthy Code status: full code DVT prophylaxis: lovenox subq  Dispo: Anticipated discharge in approximately 2 day(s).   Valinda Party, DO 05/17/2016, 2:48 PM Pager: [phone]

## 2016-05-17 NOTE — Progress Notes (Signed)
*  PRELIMINARY RESULTS* Vascular Ultrasound Left lower extremity venous duplex has been completed.  Preliminary findings: No evidence of deep vein thrombosis or baker's cysts in the left lower extremity. Moderate edema noted in calf.  Everrett Coombe 05/17/2016, 10:56 AM

## 2016-05-17 NOTE — Progress Notes (Signed)
Subjective: Anthony Osborn happily greeted [place] upon entry. He reports no complaints overnight. He states he has no pain with walking. He confirmed his recent history of taking antibiotics and worsening of the redness. He looks forward to switching to a better antibiotic.  Objective: Vital signs in last 24 hours: Vitals:   05/16/16 2251 05/17/16 0458 05/17/16 0500 05/17/16 0850  BP: 123/63 128/71  (!) 149/73  Pulse: 77 (!) 45  81  Resp: 18 18    Temp: 98.1 F (36.7 C) 97.2 F (36.2 C)    TempSrc: Oral Oral    SpO2: 95% 93%    Weight:   92.6 kg (204 lb 1.6 oz)   Height:       Weight change:   Intake/Output Summary (Last 24 hours) at 05/17/16 1021 Last data filed at 05/17/16 0900  Gross per 24 hour  Intake              556 ml  Output              395 ml  Net              161 ml   Gen: Well appearing male looking younger than stated age. Fully conversant with team. HEENT: PERRL. MMM. Adequate dentition.  Cv: Nl s1 s2. No murmur or heave.  Resp: NWB. CTAB.  Ab: NTND.  Neuro: EOM intact. Facial motion symmetric. No dysarthria.  Skin: Erythematous, blanching cellulitis present on the uniformly on the dorsum of the foot, laterally extending to the ankle and upward to mid calf. Marked with sharpie. See media for images. Left great toe with wound at toe nail. Homan's sign negative.   Lab Results: BMP wnl. CBC notable for hg of 12.3 Micro Results: Pending Blood Cx x 2 Studies/Results: DVT ultrasound Medications: I have reviewed the patient's current medications. Scheduled Meds: . aspirin EC  81 mg Oral QPM  . carvedilol  3.125 mg Oral BID WC  . cholecalciferol  1,000 Units Oral Daily  . doxycycline (VIBRAMYCIN) IV  100 mg Intravenous Q12H  . enoxaparin (LOVENOX) injection  40 mg Subcutaneous Q24H  . furosemide  40 mg Oral Daily  . lisinopril  5 mg Oral Daily  . loratadine  10 mg Oral QHS  . [START ON 05/18/2016] methimazole  2.5 mg Oral QODAY  . naphazoline-pheniramine  1 drop Both  Eyes QHS  . senna-docusate  1 tablet Oral BID  . simvastatin  5 mg Oral QHS  . sodium chloride flush  3 mL Intravenous Q12H   Continuous Infusions: PRN Meds:.sodium chloride, acetaminophen, sodium chloride flush Assessment/Plan: Active Problems:   Prostate cancer ([place])   Hx of CABG 2012   Hyperthyroidism   Essential hypertension   DVT (deep venous thrombosis) ([facility])   Cardiomyopathy, ischemic-EF 25-30%   Pacemaker (St Jude) implanted 01/23/16   Cellulitis   Mr. Anthony Osborn is a 82 year old gentleman with history of CAD/p triple-vessel CABG, Chronic Systolic CHF-EF by echo [id number] on 09/22/2015, Essential hypertension, hyperthyroidism, history of DVT (2016) and prostate cancer that presents to the ED for cellulitis.  Cellulitis   - 4 day history of redness and swelling with a source for infection at the L toe. DVT is a possibility with a h/o prior DVT, no response to antibiotics, old age and h/o prostate cancer make this also a possibility. However, the patient walks daily, has a negative homan's sign and has no coagulopathy associated diseases. Treatment for MRSA coverage should cover the swelling.  A [place] will be used to make a DVT less likely.   - Doxycycline 100 mg q12 IV  - CBCqd  History of provoked DVT During the hospital course on 04/21/15 for septic arthritis of right knee patient was found to have bilateral DVTs s/p right excision arthroplasty.  He was on Xarelto for 3 months for provoked DVT.  Today there is no calf tenderness on exam but with history of DVT and current presentation will obtain left lower extremity doppler. - left lower extremity Dopplers    CAD/p triple-vessel CABG on 03/17/2011 Patient does not complain of chest pain or shortness of breath.  -continue ASA/Statin and Coreg.    Chronic Systolic CHF-EF by echo [id number] on 09/22/2015  - Continue carvedilol 3.125mg  BID  - Continue Lasix 40mg  daiy  Hyperlipidemia  - continue simvastatin  Hyperthyroidism  -  continue methimazole.  TSH was 2.6 on 01/22/2016  Essential hypertension Stable at 112/55 - continue lisinopril 5mg    Diet: Heart healthy Code status: full code DVT prophylaxis: lovenox subq   This is a Careers information officer Note.  The care of the patient was discussed with Dr. Heber [place] and the assessment and plan formulated with their assistance.  Please see their attached note for official documentation of the daily encounter.   LOS: 0 days   Benn Moulder, Medical Student 05/17/2016, 10:21 AM

## 2016-05-17 NOTE — Care Management Note (Signed)
Case Management Note  Patient Details  Name: Anthony Osborn MRN: [id number] Date of Birth: 1919-07-13  Subjective/Objective:          Presents with L leg cellulitis,  history of CAD/ CABG, Chronic Systolic CHF-EF  123XX123 , hypertension, and hyperthyroidism. From home with wife. States independent with ADL's and uses no DME PTA, however, owns a walker.        Younis Felgar (Spouse)     [phone]       PCP: Cyndy Freeze  Action/Plan: Plan is to d/c to home when medically stable. CM to f/u with disposition needs.  Expected Discharge Date:                  Expected Discharge Plan:  Home/Self Care  In-House Referral:     Discharge planning Services  CM Consult  Post Acute Care Choice:    Choice offered to:     DME Arranged:    DME Agency:     HH Arranged:    HH Agency:     Status of Service:  In process, will continue to follow  If discussed at Long Length of Stay Meetings, dates discussed:    Additional Comments:  Sharin Mons, RN 05/17/2016, 12:53 PM

## 2016-05-17 NOTE — Progress Notes (Signed)
  Date: 05/17/2016  Patient name: Zam Ent  Medical record number: [id number]  Date of birth: 05/09/1920   I have seen and evaluated Naser Dalton and discussed their care with the Residency Team. In brief, patient is a 82 year old male with a past medical history of CAD status post CABG 3 vessels, chronic systolic heart failure with an EF of 25-30%, hypertension and hyperthyroidism who presented to the ED with left lower extremity swelling and erythema over the last 4 days. Patient went to the podiatrist on 05/13/2016 for pain in his left great toenail. At that time he was noted to have mild left lower extremity swelling and redness and was placed on Keflex by his podiatrist. Patient states that he took 3 days of the antibiotic and had no improvement in his leg. He came to the ED for further workup. He denies any pain in the leg. No fevers or chills, no nausea or vomiting, no chest pain, no calf tenderness, no shortness of breath, no syncope.  Patient feels well today with no new complaints. States that his left leg has not improved since starting IV Ancef but has not worsened either.  PMHx, Fam Hx, and/or Soc Hx : As per resident admit note  Vitals:   05/17/16 1519 05/17/16 1523  BP: 109/65 (!) 106/57  Pulse: 76 73  Resp: 18 18  Temp: 98.6 F (37 C) 97.7 F (36.5 C)   Gen.: Awake alert and oriented 3, NAD CVS: Regular rate and rhythm, normal heart sounds Lungs: Clear to auscultation bilaterally Abdomen: Soft, nontender, nondistended, normoactive bowel sounds Extremities: Left lower extremity erythema and pitting edema noted extending from ankle to mid shin  Assessment and Plan: I have seen and evaluated the patient as outlined above. I agree with the formulated Assessment and Plan as detailed in the residents' note, with the following changes:   1. Left lower extremity cellulitis: - Patient presents with left lower extremity swelling and redness consistent with cellulitis. He failed  outpatient treatment with oral Keflex which was started by his podiatrist. He was started on IV Ancef by the admitting team and has had no improvement. Lower extremity Dopplers were done which showed no evidence of DVT on preliminary results. - Given that patient has failed treatment with Keflex I'm inclined to believe that this may be a MRSA infection. We will DC IV Ancef and start IV Doxy to cover for MRSA. - If he improves on IV Doxy we will transition him to oral Doxy in the morning and discharge him home - PT eval prior to discharge  Aldine Contes, MD 12/11/20173:35 PM

## 2016-05-18 LAB — CBC
HEMATOCRIT: 38.1 % — AB (ref 39.0–52.0)
HEMOGLOBIN: 12.3 g/dL — AB (ref 13.0–17.0)
MCH: 30.7 pg (ref 26.0–34.0)
MCHC: 32.3 g/dL (ref 30.0–36.0)
MCV: 95 fL (ref 78.0–100.0)
Platelets: 216 10*3/uL (ref 150–400)
RBC: 4.01 MIL/uL — AB (ref 4.22–5.81)
RDW: 13.5 % (ref 11.5–15.5)
WBC: 10.3 10*3/uL (ref 4.0–10.5)

## 2016-05-18 MED ORDER — DOXYCYCLINE HYCLATE 100 MG PO TABS: 100.0000 mg | ORAL_TABLET | Freq: Two times a day (BID) | ORAL | 0 refills | Status: DC

## 2016-05-18 MED ORDER — DOXYCYCLINE HYCLATE 100 MG PO TABS
100.0000 mg | ORAL_TABLET | Freq: Two times a day (BID) | ORAL | Status: DC
  Administered 2016-05-18: 100 mg via ORAL
  Filled 2016-05-18: qty 1

## 2016-05-18 NOTE — Progress Notes (Signed)
   Subjective: Patient was evaluated this morning on rounds. He states improvement in swelling and redness to his left leg.  He denies any fever/chills, shortness of breath or chest pain overnight or this morning.  Objective:  Vital signs in last 24 hours: Vitals:   05/17/16 1726 05/17/16 2117 05/18/16 0443 05/18/16 0929  BP: (!) 122/56 (!) 115/57 (!) 127/58 (!) 145/83  Pulse: 72 70 83 90  Resp:  18 18   Temp:  97.3 F (36.3 C) 98.1 F (36.7 C)   TempSrc:  Oral Oral   SpO2:  97% 96%   Weight:   203 lb 12.8 oz (92.4 kg)   Height:       Physical Exam  Constitutional: He is well-developed, well-nourished, and in no distress.  Cardiovascular: Normal rate, regular rhythm and normal heart sounds.  Exam reveals no gallop and no friction rub.   No murmur heard. Pulmonary/Chest: Effort normal and breath sounds normal. No respiratory distress. He has no wheezes.  Abdominal: Soft. Bowel sounds are normal. He exhibits no distension. There is no tenderness.  Skin:  Erythema below outline, now at shin Very mild swelling in ankle and none in leg ~ this is decreased from yesterday          Assessment/Plan:  Active Problems:   Prostate cancer ([place])   Hx of CABG 2012   Hyperthyroidism   Essential hypertension   DVT (deep venous thrombosis) (HCC)   Cardiomyopathy, ischemic-EF 25-30%   Pacemaker (St Jude) implanted 01/23/16   Cellulitis  Cellulitis Patient was started on IV Doxycycline yesterday and has received 2 doses prior to being evaluated this morning .  Erythema and swelling of the left leg has improved. He denies pain, fever or chills. Patient is afebrile with no leukocytosis. Leg is mildly warm to the touch. He was started on oral doxycycline this morning and will be discharged with an additional 6 day course.  PT evaluated and did not recommend further PT at discharge. - Oral Doxycycline for additional 6 days on discharge  - PT eval and treat  CAD/p triple-vessel CABG on  03/17/2011 Patient does not complain of chest pain or shortness of breath. -continue ASA/Statin and Coreg.   Chronic Systolic CHF-EF by echo 123XX123 on 09/22/2015 -Continue carvedilol 3.125mg  BID - Continue Lasix 40mg  daiy  Hyperlipidemia -continue simvastatin  Hyperthyroidism No complaints of heat intolerance, dizziness or palpitations. - continue methimazole. TSH was 2.6 on 01/22/2016  Essential hypertension Stable at 145/83 - continue lisinopril 5mg    Dispo: Anticipated discharge today.   Valinda Party, DO 05/18/2016, 11:34 AM Pager: [phone]

## 2016-05-18 NOTE — Progress Notes (Signed)
Internal Medicine Attending:   I saw and examined the patient. I reviewed the resident's note and I agree with the resident's findings and plan as documented in the resident's note.  Patient feels well today with no new complaints. He states his left leg feels better with decreased swelling and redness. On exam erythema has improved and warmth has decreased. Will transition patient to PO doxycycline to complete 7 day course. Patient will need outpatient f/u with his PCP.

## 2016-05-18 NOTE — Evaluation (Signed)
Physical Therapy Evaluation and Discharge Patient Details Name: Anthony Osborn MRN: [id number] DOB: [birth year] Today's Date: 05/18/2016   History of Present Illness   82 year old gentleman with history of CAD/p triple-vessel CABG, Chronic Systolic CHF-EF by echo [id number] on 09/22/2015, Essential hypertension, and hyperthyroidism that presents to the ED for 4 day history of left leg swelling and redness.    Clinical Impression  Patient evaluated by Physical Therapy with no further acute PT needs identified. All education has been completed and the patient has no further questions. Recommend continued ambulation with nursing due to slight imbalance with Lt foot edema (no overt loss of balance). PT is signing off. Thank you for this referral.     Follow Up Recommendations No PT follow up;Supervision for mobility/OOB    Equipment Recommendations  None recommended by PT    Recommendations for Other Services       Precautions / Restrictions Precautions Precautions: Fall      Mobility  Bed Mobility Overal bed mobility: Modified Independent             General bed mobility comments: incr effort and time  Transfers Overall transfer level: Needs assistance Equipment used: None Transfers: Sit to/from Stand Sit to Stand: Min guard         General transfer comment: pt reports first time on his feet; minguard for safety  Ambulation/Gait Ambulation/Gait assistance: Min assist;Supervision Ambulation Distance (Feet): 325 Feet Assistive device: None Gait Pattern/deviations: Step-through pattern;Wide base of support;Decreased stride length   Gait velocity interpretation: Below normal speed for age/gender General Gait Details: slight imbalance noted--pt reports due to edema in foot, denied pain; gait improved in steadiness with incr distance  Stairs            Wheelchair Mobility    Modified Rankin (Stroke Patients Only)       Balance Overall balance assessment: Needs  assistance         Standing balance support: No upper extremity supported Standing balance-Leahy Scale: Good                               Pertinent Vitals/Pain Pain Assessment: No/denies pain    Home Living Family/patient expects to be discharged to:: Private residence Living Arrangements: Spouse/significant other Available Help at Discharge: Family;Available 24 hours/day (reports his wife is in good health and cares for him) Type of Home: House Home Access: Level entry     Home Layout: One level Home Equipment: Walker - 2 wheels      Prior Function Level of Independence: Independent               Hand Dominance        Extremity/Trunk Assessment   Upper Extremity Assessment: Overall WFL for tasks assessed           Lower Extremity Assessment: LLE deficits/detail   LLE Deficits / Details: +edema and erythema mid-calf to toes; AROM WFL  Cervical / Trunk Assessment: Normal  Communication   Communication: HOH  Cognition Arousal/Alertness: Awake/alert Behavior During Therapy: WFL for tasks assessed/performed Overall Cognitive Status: Within Functional Limits for tasks assessed                      General Comments      Exercises     Assessment/Plan    PT Assessment Patent does not need any further PT services  PT Problem List  PT Treatment Interventions      PT Goals (Current goals can be found in the Care Plan section)  Acute Rehab PT Goals Patient Stated Goal: go home today PT Goal Formulation: All assessment and education complete, DC therapy    Frequency     Barriers to discharge        Co-evaluation               End of Session Equipment Utilized During Treatment: Gait belt Activity Tolerance: Patient tolerated treatment well Patient left: in chair;with call bell/phone within reach;with chair alarm set           Time: [id number] PT Time Calculation (min) (ACUTE ONLY): 18  min   Charges:   PT Evaluation $PT Eval Low Complexity: 1 Procedure     PT G CodesJeanie Cooks [place] 02-Jun-2016, 11:01 AM Pager [phone]

## 2016-05-18 NOTE — Care Management Note (Signed)
Case Management Note  Patient Details  Name: Anthony Osborn MRN: [id number] Date of Birth: 1919-08-16  Subjective/Objective:                    Action/Plan: Plan is to d/c to home today.  Expected Discharge Date:    05/18/2016              Expected Discharge Plan:  Home/Self Care  In-House Referral:     Discharge planning Services  CM Consult    Status of Service:  Completed, signed off  If discussed at [place] of Stay Meetings, dates discussed:    Additional Comments:  Sharin Mons, RN 05/18/2016, 1:55 PM

## 2016-05-18 NOTE — Discharge Summary (Signed)
Name: Anthony Osborn MRN: [id number] DOB: 11/26/1919 82 y.o. PCP: Cyndy Freeze, MD  Date of Admission: 05/16/2016  2:33 PM Date of Discharge: 05/18/2016 Attending Physician: Aldine Contes MD  Discharge Diagnosis: 1. Cellulitis of left lower extremity  Active Problems:   Prostate cancer ([place])   Hx of CABG 2012   Hyperthyroidism   Essential hypertension   DVT (deep venous thrombosis) (HCC)   Cardiomyopathy, ischemic-EF 25-30%   Pacemaker (St Jude) implanted 01/23/16   Cellulitis   Discharge Medications: Allergies as of 05/18/2016   No Known Allergies     Medication List    STOP taking these medications   cephALEXin 500 MG capsule Commonly known as:  KEFLEX     TAKE these medications   acetaminophen 325 MG tablet Commonly known as:  TYLENOL Take 2 tablets (650 mg total) by mouth every 6 (six) hours as needed for mild pain (or Fever >/= 101).   aspirin EC 81 MG tablet Take 81 mg by mouth every evening.   CALCIUM-MAGNESIUM-ZINC PO Take 1 tablet by mouth 3 (three) times daily. Reported Osborn 12/15/2015   carvedilol 3.125 MG tablet Commonly known as:  COREG Take 1 tablet (3.125 mg total) by mouth 2 (two) times daily.   doxycycline 100 MG tablet Commonly known as:  VIBRA-TABS Take 1 tablet (100 mg total) by mouth every 12 (twelve) hours.   furosemide 40 MG tablet Commonly known as:  LASIX Take 1 tablet (40 mg total) by mouth daily.   ICAPS Caps Take 2 capsules by mouth daily.   lisinopril 5 MG tablet Commonly known as:  PRINIVIL,ZESTRIL TAKE ONE TABLET BY MOUTH ONCE DAILY   loratadine 10 MG tablet Commonly known as:  CLARITIN Take 10 mg by mouth at bedtime.   methimazole 5 MG tablet Commonly known as:  TAPAZOLE Take 2.5 mg by mouth every other day.   naphazoline-pheniramine 0.025-0.3 % ophthalmic solution Commonly known as:  NAPHCON-A Place 1 drop into both eyes at bedtime.   sennosides-docusate sodium 8.6-50 MG tablet Commonly known as:   SENOKOT-S Take 1 tablet by mouth 2 (two) times daily.   VITAMIN D-3 PO Take 1,000 Units by mouth daily.   ZOCOR 10 MG tablet Generic drug:  simvastatin Take 5 mg by mouth at bedtime.       Disposition and follow-up:   [name] was discharged from [facility] in Good condition.  At the hospital follow up visit please address:  1.  Cellulitis - Please assess completion of antibiotic course.  Please assess if patient has followed up with podiatry for left toe nail.   2.  Labs / imaging needed at time of follow-up: NA  3.  Pending labs/ test needing follow-up: NA  Follow-up Appointments: Follow-up Information    Cyndy Freeze, MD Follow up.   Specialty:  Family Medicine Contact information: [place] [address] [address] Hospital Course by problem list: Active Problems:   Prostate cancer [facility])   Hx of CABG 2012   Hyperthyroidism   Essential hypertension   DVT (deep venous thrombosis) (HCC)   Cardiomyopathy, ischemic-EF 25-30%   Pacemaker (St Jude) implanted 01/23/16   Cellulitis   1. Cellulitis of Left Lower Extremity The patient had a four day history of redness and swelling of the left lower extremity while Osborn keflex.  The patient was clinically stable with a erythematous, blanching cellulitis that presented uniformly Osborn the dorsum of the foot,  laterally extending to the ankle and upward to mid calf.  US doppler was negative for DVT. Because the patient failed outpatient keflex, the patient was started Osborn IV ancef however, with little improvement.   IV Doxycycline was started and the following day there was improvement to the area. WBC was wnl. The patient was discharged with oral doxycycline for 5 additional days.  2. CAD s/p CABG x 3 03/2011  No complaints of chest pain or SOB. Continued with home ASA/Statin and coreg.   3.  HFrEF (25-30% 09/2015)  No signs of volume overload. No SOB. Continued with home coreg and  lasix.   4. Hyperthyroidism  TSH was 2.6 01/2016. Continued with methimazole.  5. HTN  Stable around 120s/60s. Continued home lisinopril 5 mg.   Discharge Vitals:   BP (!) 145/83   Pulse 90   Temp 98.1 F (36.7 C) (Oral)   Resp 18   Ht 6' (1.829 m)   Wt 203 lb 12.8 oz (92.4 kg)   SpO2 96%   BMI 27.64 kg/m   Pertinent Labs, Studies, and Procedures:  CBC  Discharge Instructions:  Discharge Instructions    Diet - low sodium heart healthy    Complete by:  As directed    Discharge instructions    Complete by:  As directed    Please take your antibiotic starting tonight.  You will need to continue the antibiotic for 5 more days after tonight  Please follow up with your primary care provider after discharge Please follow up with her podiatrist after discharge   Increase activity slowly    Complete by:  As directed       Signed: Valinda Party, DO 05/25/2016, 9:40 PM

## 2016-05-21 LAB — CULTURE, BLOOD (ROUTINE X 2)
Culture: NO GROWTH
Culture: NO GROWTH

## 2016-06-09 ENCOUNTER — Encounter: Payer: Self-pay | Admitting: Sports Medicine

## 2016-06-09 ENCOUNTER — Ambulatory Visit (INDEPENDENT_AMBULATORY_CARE_PROVIDER_SITE_OTHER): Payer: Medicare Other | Admitting: Sports Medicine

## 2016-06-09 DIAGNOSIS — L603 Nail dystrophy: Secondary | ICD-10-CM | POA: Diagnosis not present

## 2016-06-09 DIAGNOSIS — M79675 Pain in left toe(s): Secondary | ICD-10-CM | POA: Diagnosis not present

## 2016-06-09 NOTE — Progress Notes (Signed)
Subjective: Anthony Osborn is a 83 y.o. male patient seen today in office with complaint of painful thickened and elongated left 1st toenail; unable to trim. Patient denies history of Diabetes, Neuropathy, or known Vascular disease; states has pacemaker. Admits to past history of left leg cellulitis and nail infection of which he was hospitalized for > 1 year ago and is concerned that this could happen again. Patient has no other pedal complaints at this time.   Patient Active Problem List   Diagnosis Date Noted  . Cellulitis 05/16/2016  . Cellulitis of left lower extremity 05/13/2016  . Leg edema, left 05/13/2016  . Aftercare following surgery 04/27/2016  . Ingrown nail 04/20/2016  . Subungual hematoma of great toe of right foot 04/20/2016  . Ulcer of right foot, limited to breakdown of skin ([place]) 03/11/2016  . Cardiomyopathy, ischemic-EF 25-30% 01/24/2016  . Pacemaker (St Jude) implanted 01/23/16 01/24/2016  . Syncope 01/22/2016  . Third degree AV block ([place]) 01/22/2016  . Onychomycosis due to dermatophyte 01/20/2016  . Status post revision of total replacement of right knee 05/12/2015  . Pseudomonas infection   . DVT (deep venous thrombosis) ([place]) 04/27/2015  . Pyogenic arthritis of right knee joint ([place])   . Essential hypertension   . Encounter for palliative care   . Hx of CABG 2012 04/21/2015  . Septic arthritis of knee, right ([place]) 04/21/2015  . Cough 04/21/2015  . Hyperlipidemia   . Prostate cancer ([place])   . Hyperthyroidism     Current Outpatient Prescriptions on File Prior to Visit  Medication Sig Dispense Refill  . acetaminophen (TYLENOL) 325 MG tablet Take 2 tablets (650 mg total) by mouth every 6 (six) hours as needed for mild pain (or Fever >/= 101).    Marland Kitchen aspirin EC 81 MG tablet Take 81 mg by mouth every evening.     Marland Kitchen CALCIUM-MAGNESIUM-ZINC PO Take 1 tablet by mouth 3 (three) times daily. Reported on 12/15/2015    . carvedilol (COREG) 3.125 MG tablet Take 1 tablet (3.125  mg total) by mouth 2 (two) times daily. 180 tablet 3  . Cholecalciferol (VITAMIN D-3 PO) Take 1,000 Units by mouth daily.      Marland Kitchen doxycycline (VIBRA-TABS) 100 MG tablet Take 1 tablet (100 mg total) by mouth every 12 (twelve) hours. 11 tablet 0  . furosemide (LASIX) 40 MG tablet Take 1 tablet (40 mg total) by mouth daily. 30 tablet 6  . lisinopril (PRINIVIL,ZESTRIL) 5 MG tablet TAKE ONE TABLET BY MOUTH ONCE DAILY 90 tablet 2  . loratadine (CLARITIN) 10 MG tablet Take 10 mg by mouth at bedtime.    . methimazole (TAPAZOLE) 5 MG tablet Take 2.5 mg by mouth every other day.    . Multiple Vitamins-Minerals (ICAPS) CAPS Take 2 capsules by mouth daily.    . naphazoline-pheniramine (NAPHCON-A) 0.025-0.3 % ophthalmic solution Place 1 drop into both eyes at bedtime.    . sennosides-docusate sodium (SENOKOT-S) 8.6-50 MG tablet Take 1 tablet by mouth 2 (two) times daily.    . simvastatin (ZOCOR) 10 MG tablet Take 5 mg by mouth at bedtime.      No current facility-administered medications on file prior to visit.     No Known Allergies  Objective: Physical Exam  General: Well developed, nourished, no acute distress, awake, alert and oriented x 3  Vascular: Dorsalis pedis artery 1/4 bilateral, Posterior tibial artery 1/4 bilateral, skin temperature warm to warm proximal to distal bilateral lower extremities, + varicosities, no pedal hair present bilateral. +  1 pitting edema bilateral.   Neurological: Gross sensation present via light touch bilateral. Protective and vibratory diminished bilateral.   Dermatological: Skin is warm, dry, and supple bilateral, Left>Right hallux nails are tender, long, thick, and discolored with moderate subungal debris, no webspace macerations present bilateral, no open lesions present bilateral, no callus/corns/hyperkeratotic tissue present bilateral. No acute signs of infection bilateral.  Musculoskeletal: Asymptomatic hammertoe boney deformities noted bilateral. Muscular  strength within normal limits without painon range of motion. No pain with calf compression bilateral.  Assessment and Plan:  Problem List Items Addressed This Visit    None    Visit Diagnoses    Nail dystrophy    -  Primary   Toe pain, left          -Examined patient.  -Discussed treatment options for painful mycotic nails. -Mechanically debrided and reduced mycotic left and right hallux nails with sterile nail nipper and dremel nail file without incident. -Recommend compression socks from elastic therapy  -Patient to return in 3 months for follow up evaluation or sooner if symptoms worsen.  Landis Martins, DPM

## 2016-06-14 ENCOUNTER — Other Ambulatory Visit: Payer: Self-pay | Admitting: Cardiovascular Disease

## 2016-06-15 DIAGNOSIS — S42021A Displaced fracture of shaft of right clavicle, initial encounter for closed fracture: Secondary | ICD-10-CM | POA: Diagnosis not present

## 2016-06-18 DIAGNOSIS — H353211 Exudative age-related macular degeneration, right eye, with active choroidal neovascularization: Secondary | ICD-10-CM | POA: Diagnosis not present

## 2016-07-27 ENCOUNTER — Ambulatory Visit (INDEPENDENT_AMBULATORY_CARE_PROVIDER_SITE_OTHER): Payer: Medicare Other | Admitting: *Deleted

## 2016-07-27 ENCOUNTER — Telehealth: Payer: Self-pay | Admitting: Cardiology

## 2016-07-27 DIAGNOSIS — I442 Atrioventricular block, complete: Secondary | ICD-10-CM

## 2016-07-27 NOTE — Telephone Encounter (Signed)
Confirmed remote transmission w/ pt wife.   

## 2016-07-28 ENCOUNTER — Encounter: Payer: Self-pay | Admitting: Cardiology

## 2016-07-28 LAB — CUP PACEART REMOTE DEVICE CHECK
Brady Statistic AP VP Percent: 4.6 %
Brady Statistic AP VS Percent: 1 %
Brady Statistic AS VP Percent: 94 %
Brady Statistic RA Percent Paced: 4.3 %
Brady Statistic RV Percent Paced: 99 %
Implantable Lead Implant Date: 20170818
Implantable Lead Location: 753859
Implantable Lead Location: 753860
Lead Channel Impedance Value: 530 Ohm
Lead Channel Impedance Value: 530 Ohm
Lead Channel Pacing Threshold Amplitude: 0.75 V
Lead Channel Pacing Threshold Pulse Width: 0.4 ms
Lead Channel Pacing Threshold Pulse Width: 0.4 ms
Lead Channel Sensing Intrinsic Amplitude: 12 mV
Lead Channel Setting Pacing Amplitude: 1 V
Lead Channel Setting Pacing Amplitude: 2.5 V
Lead Channel Setting Pacing Pulse Width: 0.4 ms
MDC IDC LEAD IMPLANT DT: 20170818
MDC IDC MSMT BATTERY REMAINING LONGEVITY: 131 mo
MDC IDC MSMT BATTERY REMAINING PERCENTAGE: 95.5 %
MDC IDC MSMT BATTERY VOLTAGE: 3.01 V
MDC IDC MSMT LEADCHNL RA PACING THRESHOLD AMPLITUDE: 1.25 V
MDC IDC MSMT LEADCHNL RA SENSING INTR AMPL: 3.8 mV
MDC IDC PG IMPLANT DT: 20170818
MDC IDC SESS DTM: 20180220192623
MDC IDC SET LEADCHNL RV SENSING SENSITIVITY: 2 mV
MDC IDC STAT BRADY AS VS PERCENT: 1 %
Pulse Gen Model: 2272
Pulse Gen Serial Number: 7939408

## 2016-07-28 NOTE — Progress Notes (Signed)
Remote pacemaker transmission.   

## 2016-08-05 DIAGNOSIS — H353211 Exudative age-related macular degeneration, right eye, with active choroidal neovascularization: Secondary | ICD-10-CM | POA: Diagnosis not present

## 2016-09-08 ENCOUNTER — Telehealth: Payer: Self-pay | Admitting: *Deleted

## 2016-09-08 ENCOUNTER — Ambulatory Visit (INDEPENDENT_AMBULATORY_CARE_PROVIDER_SITE_OTHER): Payer: Medicare Other | Admitting: Sports Medicine

## 2016-09-08 DIAGNOSIS — I739 Peripheral vascular disease, unspecified: Secondary | ICD-10-CM

## 2016-09-08 DIAGNOSIS — R0989 Other specified symptoms and signs involving the circulatory and respiratory systems: Secondary | ICD-10-CM

## 2016-09-08 DIAGNOSIS — B351 Tinea unguium: Secondary | ICD-10-CM | POA: Diagnosis not present

## 2016-09-08 DIAGNOSIS — M79674 Pain in right toe(s): Secondary | ICD-10-CM

## 2016-09-08 DIAGNOSIS — M79675 Pain in left toe(s): Secondary | ICD-10-CM

## 2016-09-08 NOTE — Telephone Encounter (Addendum)
-----   Message from Landis Martins, [place] sent at 09/08/2016  2:31 PM EDT ----- Regarding: ABI/PVRs Order ABI/PVRs bilateral [place]  -Dr Chauncey Cruel. Orders faxed to [facility].

## 2016-09-08 NOTE — Progress Notes (Signed)
Subjective: Anthony Osborn is a 83 y.o. male patient seen today in office with complaint of painful thickened and elongated left 1st toenail; unable to trim. Reports that he trim the nails himself. Using a knife and call some bleeding. States that the nail is constantly painful and wants it removed. Patient has no other pedal complaints at this time.   Patient Active Problem List   Diagnosis Date Noted  . Cellulitis 05/16/2016  . Cellulitis of left lower extremity 05/13/2016  . Leg edema, left 05/13/2016  . Aftercare following surgery 04/27/2016  . Ingrown nail 04/20/2016  . Subungual hematoma of great toe of right foot 04/20/2016  . Ulcer of right foot, limited to breakdown of skin ([place]) 03/11/2016  . Cardiomyopathy, ischemic-EF 25-30% 01/24/2016  . Pacemaker (St Jude) implanted 01/23/16 01/24/2016  . Syncope 01/22/2016  . Third degree AV block ([place]) 01/22/2016  . Onychomycosis due to dermatophyte 01/20/2016  . Status post revision of total replacement of right knee 05/12/2015  . Pseudomonas infection   . DVT (deep venous thrombosis) ([place]) 04/27/2015  . Pyogenic arthritis of right knee joint ([place])   . Essential hypertension   . Encounter for palliative care   . Hx of CABG 2012 04/21/2015  . Septic arthritis of knee, right ([place]) 04/21/2015  . Cough 04/21/2015  . Hyperlipidemia   . Prostate cancer ([place])   . Hyperthyroidism     Current Outpatient Prescriptions on File Prior to Visit  Medication Sig Dispense Refill  . acetaminophen (TYLENOL) 325 MG tablet Take 2 tablets (650 mg total) by mouth every 6 (six) hours as needed for mild pain (or Fever >/= 101).    Marland Kitchen aspirin EC 81 MG tablet Take 81 mg by mouth every evening.     Marland Kitchen CALCIUM-MAGNESIUM-ZINC PO Take 1 tablet by mouth 3 (three) times daily. Reported on 12/15/2015    . carvedilol (COREG) 3.125 MG tablet Take 1 tablet (3.125 mg total) by mouth 2 (two) times daily. 180 tablet 3  . Cholecalciferol (VITAMIN D-3 PO) Take 1,000 Units by  mouth daily.      Marland Kitchen doxycycline (VIBRA-TABS) 100 MG tablet Take 1 tablet (100 mg total) by mouth every 12 (twelve) hours. 11 tablet 0  . furosemide (LASIX) 40 MG tablet TAKE ONE TABLET BY MOUTH DAILY 30 tablet 6  . lisinopril (PRINIVIL,ZESTRIL) 5 MG tablet TAKE ONE TABLET BY MOUTH ONCE DAILY 90 tablet 2  . loratadine (CLARITIN) 10 MG tablet Take 10 mg by mouth at bedtime.    . methimazole (TAPAZOLE) 5 MG tablet Take 2.5 mg by mouth every other day.    . Multiple Vitamins-Minerals (ICAPS) CAPS Take 2 capsules by mouth daily.    . naphazoline-pheniramine (NAPHCON-A) 0.025-0.3 % ophthalmic solution Place 1 drop into both eyes at bedtime.    . sennosides-docusate sodium (SENOKOT-S) 8.6-50 MG tablet Take 1 tablet by mouth 2 (two) times daily.    . simvastatin (ZOCOR) 10 MG tablet Take 5 mg by mouth at bedtime.      No current facility-administered medications on file prior to visit.     No Known Allergies  Objective: Physical Exam  General: Well developed, nourished, no acute distress, awake, alert and oriented x 3  Vascular: Dorsalis pedis artery, Very faint 1/4 bilateral, Posterior tibial artery , very faint 1/4 bilateral, skin temperature warm to warm proximal to distal bilateral lower extremities, + varicosities, no pedal hair present bilateral. +1 pitting edema bilateral.   Neurological: Gross sensation present via light touch bilateral. Protective  and vibratory diminished bilateral.   Dermatological: Skin is warm, dry, and supple bilateral, Left>Right hallux nails are tender, long, thick, and discolored with moderate subungal debris, no webspace macerations present bilateral, mild reactive calluses pre-ulcerative in nature, sub-met 1 and 5 bilateral. No acute signs of infection bilateral.  Musculoskeletal: Asymptomatic fat pad atrophy and hammertoe boney deformities noted bilateral. Muscular strength within normal limits without painon range of motion. No pain with calf compression  bilateral.  Assessment and Plan:  Problem List Items Addressed This Visit    None    Visit Diagnoses    Pain due to onychomycosis of toenails of both feet    -  Primary   PVD (peripheral vascular disease) ([place])          -Examined patient.  -Discussed treatment options for painful mycotic nails. -Mechanically debrided and reduced mycotic nails with sterile nail nipper and dremel nail file without incident. -Recommend Arterial studies to evaluate if patient can indeed heal a nail avulsion procedure at left great toe. Advised patient that we will get the studies first before attempting to do any type of procedure. Due to his history of PVD and history of cellulitis previously in the left lower extremity -Patient to return after vascular studies for follow up evaluation or sooner if symptoms worsen.  Landis Martins, DPM

## 2016-09-16 DIAGNOSIS — H353211 Exudative age-related macular degeneration, right eye, with active choroidal neovascularization: Secondary | ICD-10-CM | POA: Diagnosis not present

## 2016-09-22 DIAGNOSIS — R0989 Other specified symptoms and signs involving the circulatory and respiratory systems: Secondary | ICD-10-CM | POA: Diagnosis not present

## 2016-10-12 DIAGNOSIS — C61 Malignant neoplasm of prostate: Secondary | ICD-10-CM | POA: Diagnosis not present

## 2016-10-14 DIAGNOSIS — H353211 Exudative age-related macular degeneration, right eye, with active choroidal neovascularization: Secondary | ICD-10-CM | POA: Diagnosis not present

## 2016-10-19 DIAGNOSIS — C61 Malignant neoplasm of prostate: Secondary | ICD-10-CM | POA: Diagnosis not present

## 2016-10-20 ENCOUNTER — Telehealth: Payer: Self-pay | Admitting: *Deleted

## 2016-10-20 NOTE — Telephone Encounter (Addendum)
-----   Message from [place], [place] sent at 10/20/2016  1:07 PM EDT ----- Regarding: Vascular studies  Can you let the patient know that his circulation test shows that there is decreased blood flow to the toes (0.24, normal healing is anything above 0.45).  I do not recommend a nail procedure because he will not be able to heal from it. Advise him to closely monitor toes, if he feels increased pain or sees discoloration to toes we will have to refer him to a vascular doctor. -Dr. Chauncey Cruel. Left message informing pt Dr. Cannon Kettle had reviewed test and to call for results.10/20/2016-Pt's wife, [name] called for results.10/21/2016-Left message to call for results, and that I would call again if I had not spoken with her or pt. I informed pt's wife, [name] of Dr. Leeanne Rio review of vascular studies.

## 2016-10-21 ENCOUNTER — Telehealth: Payer: Self-pay | Admitting: Sports Medicine

## 2016-10-21 NOTE — Telephone Encounter (Signed)
Pts wife calling for results of pts test

## 2016-10-26 ENCOUNTER — Ambulatory Visit (INDEPENDENT_AMBULATORY_CARE_PROVIDER_SITE_OTHER): Payer: Medicare Other | Admitting: *Deleted

## 2016-10-26 DIAGNOSIS — I442 Atrioventricular block, complete: Secondary | ICD-10-CM

## 2016-10-26 NOTE — Progress Notes (Signed)
Remote pacemaker transmission.   

## 2016-10-27 ENCOUNTER — Encounter: Payer: Self-pay | Admitting: Cardiology

## 2016-10-28 LAB — CUP PACEART REMOTE DEVICE CHECK
Battery Remaining Longevity: 127 mo
Brady Statistic AP VS Percent: 1 %
Brady Statistic AS VP Percent: 93 %
Brady Statistic AS VS Percent: 2.1 %
Implantable Lead Location: 753860
Lead Channel Impedance Value: 460 Ohm
Lead Channel Impedance Value: 510 Ohm
Lead Channel Pacing Threshold Amplitude: 0.75 V
Lead Channel Sensing Intrinsic Amplitude: 9 mV
Lead Channel Setting Pacing Amplitude: 2.5 V
Lead Channel Setting Pacing Pulse Width: 0.4 ms
Lead Channel Setting Sensing Sensitivity: 2 mV
MDC IDC LEAD IMPLANT DT: 20170818
MDC IDC LEAD IMPLANT DT: 20170818
MDC IDC LEAD LOCATION: 753859
MDC IDC MSMT BATTERY REMAINING PERCENTAGE: 95.5 %
MDC IDC MSMT BATTERY VOLTAGE: 3.01 V
MDC IDC MSMT LEADCHNL RA PACING THRESHOLD AMPLITUDE: 1.25 V
MDC IDC MSMT LEADCHNL RA PACING THRESHOLD PULSEWIDTH: 0.4 ms
MDC IDC MSMT LEADCHNL RA SENSING INTR AMPL: 4.1 mV
MDC IDC MSMT LEADCHNL RV PACING THRESHOLD PULSEWIDTH: 0.4 ms
MDC IDC PG IMPLANT DT: 20170818
MDC IDC SESS DTM: 20180522060016
MDC IDC SET LEADCHNL RV PACING AMPLITUDE: 1 V
MDC IDC STAT BRADY AP VP PERCENT: 4.1 %
MDC IDC STAT BRADY RA PERCENT PACED: 3.6 %
MDC IDC STAT BRADY RV PERCENT PACED: 97 %
Pulse Gen Model: 2272
Pulse Gen Serial Number: 7939408

## 2016-11-18 DIAGNOSIS — H353211 Exudative age-related macular degeneration, right eye, with active choroidal neovascularization: Secondary | ICD-10-CM | POA: Diagnosis not present

## 2016-11-25 ENCOUNTER — Ambulatory Visit (INDEPENDENT_AMBULATORY_CARE_PROVIDER_SITE_OTHER): Payer: Medicare Other | Admitting: Sports Medicine

## 2016-11-25 DIAGNOSIS — M79675 Pain in left toe(s): Secondary | ICD-10-CM

## 2016-11-25 DIAGNOSIS — I739 Peripheral vascular disease, unspecified: Secondary | ICD-10-CM

## 2016-11-25 DIAGNOSIS — B351 Tinea unguium: Secondary | ICD-10-CM | POA: Diagnosis not present

## 2016-11-25 DIAGNOSIS — M79674 Pain in right toe(s): Secondary | ICD-10-CM

## 2016-11-25 DIAGNOSIS — R0989 Other specified symptoms and signs involving the circulatory and respiratory systems: Secondary | ICD-10-CM

## 2016-11-25 NOTE — Progress Notes (Signed)
Subjective: Anthony Osborn is a 83 y.o. male patient seen today in office with complaint of painful thickened and elongated toenails; unable to trim. Reports that nail trims help his feet feel less painful. Patient has no other pedal complaints at this time.   Patient Active Problem List   Diagnosis Date Noted  . Cellulitis 05/16/2016  . Cellulitis of left lower extremity 05/13/2016  . Leg edema, left 05/13/2016  . Aftercare following surgery 04/27/2016  . Ingrown nail 04/20/2016  . Subungual hematoma of great toe of right foot 04/20/2016  . Ulcer of right foot, limited to breakdown of skin ([place]) 03/11/2016  . Cardiomyopathy, ischemic-EF 25-30% 01/24/2016  . Pacemaker (St Jude) implanted 01/23/16 01/24/2016  . Syncope 01/22/2016  . Third degree AV block ([place]) 01/22/2016  . Onychomycosis due to dermatophyte 01/20/2016  . Status post revision of total replacement of right knee 05/12/2015  . Pseudomonas infection   . DVT (deep venous thrombosis) ([place]) 04/27/2015  . Pyogenic arthritis of right knee joint ([place])   . Essential hypertension   . Encounter for palliative care   . Hx of CABG 2012 04/21/2015  . Septic arthritis of knee, right ([place]) 04/21/2015  . Cough 04/21/2015  . Hyperlipidemia   . Prostate cancer ([place])   . Hyperthyroidism     Current Outpatient Prescriptions on File Prior to Visit  Medication Sig Dispense Refill  . acetaminophen (TYLENOL) 325 MG tablet Take 2 tablets (650 mg total) by mouth every 6 (six) hours as needed for mild pain (or Fever >/= 101).    Marland Kitchen aspirin EC 81 MG tablet Take 81 mg by mouth every evening.     Marland Kitchen CALCIUM-MAGNESIUM-ZINC PO Take 1 tablet by mouth 3 (three) times daily. Reported on 12/15/2015    . carvedilol (COREG) 3.125 MG tablet Take 1 tablet (3.125 mg total) by mouth 2 (two) times daily. 180 tablet 3  . Cholecalciferol (VITAMIN D-3 PO) Take 1,000 Units by mouth daily.      Marland Kitchen doxycycline (VIBRA-TABS) 100 MG tablet Take 1 tablet (100 mg total) by  mouth every 12 (twelve) hours. 11 tablet 0  . furosemide (LASIX) 40 MG tablet TAKE ONE TABLET BY MOUTH DAILY 30 tablet 6  . lisinopril (PRINIVIL,ZESTRIL) 5 MG tablet TAKE ONE TABLET BY MOUTH ONCE DAILY 90 tablet 2  . loratadine (CLARITIN) 10 MG tablet Take 10 mg by mouth at bedtime.    . methimazole (TAPAZOLE) 5 MG tablet Take 2.5 mg by mouth every other day.    . Multiple Vitamins-Minerals (ICAPS) CAPS Take 2 capsules by mouth daily.    . naphazoline-pheniramine (NAPHCON-A) 0.025-0.3 % ophthalmic solution Place 1 drop into both eyes at bedtime.    . sennosides-docusate sodium (SENOKOT-S) 8.6-50 MG tablet Take 1 tablet by mouth 2 (two) times daily.    . simvastatin (ZOCOR) 10 MG tablet Take 5 mg by mouth at bedtime.      No current facility-administered medications on file prior to visit.     No Known Allergies  Objective: Physical Exam  General: Well developed, nourished, no acute distress, awake, alert and oriented x 3  Vascular: Dorsalis pedis artery, Very faint 1/4 bilateral, Posterior tibial artery , very faint 1/4 bilateral, skin temperature warm to warm proximal to distal bilateral lower extremities, + varicosities, no pedal hair present bilateral. +1 pitting edema bilateral.   Neurological: Gross sensation present via light touch bilateral. Protective and vibratory diminished bilateral.   Dermatological: Skin is warm, dry, and supple bilateral, Left>Right hallux nails  are tender, long, thick, and discolored with moderate subungal debris, no webspace macerations present bilateral, mild reactive calluses pre-ulcerative in nature, sub-met 1 and 5 bilateral. No acute signs of infection bilateral.  Musculoskeletal: Asymptomatic fat pad atrophy and hammertoe boney deformities noted bilateral. Muscular strength within normal limits without painon range of motion. No pain with calf compression bilateral.  Assessment and Plan:  Problem List Items Addressed This Visit    None    Visit  Diagnoses    Pain due to onychomycosis of toenails of both feet    -  Primary   Diminished pulses in lower extremity       PVD (peripheral vascular disease) ([place])          -Examined patient.  -Discussed treatment options for painful mycotic nails. -Mechanically debrided and reduced mycotic nails with sterile nail nipper and dremel nail file without incident. -Will closely monitor feet since TBI is low -Patient to return after 3 months for follow up evaluation or sooner if symptoms worsen.  Landis Martins, DPM

## 2016-12-30 DIAGNOSIS — H353212 Exudative age-related macular degeneration, right eye, with inactive choroidal neovascularization: Secondary | ICD-10-CM | POA: Diagnosis not present

## 2017-01-03 DIAGNOSIS — H353211 Exudative age-related macular degeneration, right eye, with active choroidal neovascularization: Secondary | ICD-10-CM | POA: Diagnosis not present

## 2017-01-03 DIAGNOSIS — H353121 Nonexudative age-related macular degeneration, left eye, early dry stage: Secondary | ICD-10-CM | POA: Diagnosis not present

## 2017-01-25 ENCOUNTER — Ambulatory Visit (INDEPENDENT_AMBULATORY_CARE_PROVIDER_SITE_OTHER): Payer: Medicare Other | Admitting: *Deleted

## 2017-01-25 DIAGNOSIS — I442 Atrioventricular block, complete: Secondary | ICD-10-CM | POA: Diagnosis not present

## 2017-01-26 NOTE — Progress Notes (Signed)
Remote pacemaker transmission.   

## 2017-02-02 LAB — CUP PACEART REMOTE DEVICE CHECK
Brady Statistic AP VP Percent: 3.7 %
Brady Statistic AP VS Percent: 1 %
Brady Statistic AS VP Percent: 86 %
Brady Statistic AS VS Percent: 9.2 %
Brady Statistic RV Percent Paced: 90 %
Implantable Lead Implant Date: 20170818
Implantable Lead Location: 753859
Lead Channel Impedance Value: 460 Ohm
Lead Channel Impedance Value: 540 Ohm
Lead Channel Pacing Threshold Amplitude: 0.75 V
Lead Channel Pacing Threshold Pulse Width: 0.4 ms
Lead Channel Pacing Threshold Pulse Width: 0.4 ms
Lead Channel Sensing Intrinsic Amplitude: 11.1 mV
Lead Channel Setting Pacing Amplitude: 1 V
Lead Channel Setting Pacing Amplitude: 2.5 V
MDC IDC LEAD IMPLANT DT: 20170818
MDC IDC LEAD LOCATION: 753860
MDC IDC MSMT BATTERY REMAINING LONGEVITY: 126 mo
MDC IDC MSMT BATTERY REMAINING PERCENTAGE: 95.5 %
MDC IDC MSMT BATTERY VOLTAGE: 3.01 V
MDC IDC MSMT LEADCHNL RA PACING THRESHOLD AMPLITUDE: 1.25 V
MDC IDC MSMT LEADCHNL RA SENSING INTR AMPL: 4.6 mV
MDC IDC PG IMPLANT DT: 20170818
MDC IDC SESS DTM: 20180821081908
MDC IDC SET LEADCHNL RV PACING PULSEWIDTH: 0.4 ms
MDC IDC SET LEADCHNL RV SENSING SENSITIVITY: 2 mV
MDC IDC STAT BRADY RA PERCENT PACED: 3.2 %
Pulse Gen Serial Number: 7939408

## 2017-02-03 DIAGNOSIS — H353212 Exudative age-related macular degeneration, right eye, with inactive choroidal neovascularization: Secondary | ICD-10-CM | POA: Diagnosis not present

## 2017-02-04 ENCOUNTER — Encounter: Payer: Self-pay | Admitting: Cardiology

## 2017-02-07 ENCOUNTER — Other Ambulatory Visit: Payer: Self-pay | Admitting: Cardiovascular Disease

## 2017-02-25 ENCOUNTER — Encounter (INDEPENDENT_AMBULATORY_CARE_PROVIDER_SITE_OTHER): Payer: Self-pay

## 2017-02-25 ENCOUNTER — Ambulatory Visit (INDEPENDENT_AMBULATORY_CARE_PROVIDER_SITE_OTHER): Payer: Medicare Other | Admitting: Sports Medicine

## 2017-02-25 DIAGNOSIS — M79674 Pain in right toe(s): Secondary | ICD-10-CM

## 2017-02-25 DIAGNOSIS — L84 Corns and callosities: Secondary | ICD-10-CM

## 2017-02-25 DIAGNOSIS — I739 Peripheral vascular disease, unspecified: Secondary | ICD-10-CM | POA: Diagnosis not present

## 2017-02-25 DIAGNOSIS — R0989 Other specified symptoms and signs involving the circulatory and respiratory systems: Secondary | ICD-10-CM

## 2017-02-25 DIAGNOSIS — B351 Tinea unguium: Secondary | ICD-10-CM

## 2017-02-25 DIAGNOSIS — M79675 Pain in left toe(s): Secondary | ICD-10-CM | POA: Diagnosis not present

## 2017-02-26 NOTE — Progress Notes (Signed)
Subjective: Anthony Osborn is a 83 y.o. male patient seen today in office with complaint of painful thickened and elongated toenails and callus; unable to trim. Patient has no other pedal complaints at this time.   Patient Active Problem List   Diagnosis Date Noted  . Cellulitis 05/16/2016  . Cellulitis of left lower extremity 05/13/2016  . Leg edema, left 05/13/2016  . Aftercare following surgery 04/27/2016  . Ingrown nail 04/20/2016  . Subungual hematoma of great toe of right foot 04/20/2016  . Ulcer of right foot, limited to breakdown of skin ([place]) 03/11/2016  . Cardiomyopathy, ischemic-EF 25-30% 01/24/2016  . Pacemaker (St Jude) implanted 01/23/16 01/24/2016  . Syncope 01/22/2016  . Third degree AV block ([place]) 01/22/2016  . Onychomycosis due to dermatophyte 01/20/2016  . Status post revision of total replacement of right knee 05/12/2015  . Pseudomonas infection   . DVT (deep venous thrombosis) ([place]) 04/27/2015  . Pyogenic arthritis of right knee joint ([place])   . Essential hypertension   . Encounter for palliative care   . Hx of CABG 2012 04/21/2015  . Septic arthritis of knee, right ([place]) 04/21/2015  . Cough 04/21/2015  . Hyperlipidemia   . Prostate cancer ([place])   . Hyperthyroidism     Current Outpatient Prescriptions on File Prior to Visit  Medication Sig Dispense Refill  . acetaminophen (TYLENOL) 325 MG tablet Take 2 tablets (650 mg total) by mouth every 6 (six) hours as needed for mild pain (or Fever >/= 101).    Marland Kitchen aspirin EC 81 MG tablet Take 81 mg by mouth every evening.     Marland Kitchen CALCIUM-MAGNESIUM-ZINC PO Take 1 tablet by mouth 3 (three) times daily. Reported on 12/15/2015    . carvedilol (COREG) 3.125 MG tablet Take 1 tablet (3.125 mg total) by mouth 2 (two) times daily. 180 tablet 3  . Cholecalciferol (VITAMIN D-3 PO) Take 1,000 Units by mouth daily.      Marland Kitchen doxycycline (VIBRA-TABS) 100 MG tablet Take 1 tablet (100 mg total) by mouth every 12 (twelve) hours. (Patient not  taking: Reported on 02/25/2017) 11 tablet 0  . furosemide (LASIX) 40 MG tablet TAKE ONE TABLET BY MOUTH DAILY 30 tablet 6  . lisinopril (PRINIVIL,ZESTRIL) 5 MG tablet TAKE ONE TABLET BY MOUTH ONCE DAILY 90 tablet 0  . loratadine (CLARITIN) 10 MG tablet Take 10 mg by mouth at bedtime.    . methimazole (TAPAZOLE) 5 MG tablet Take 2.5 mg by mouth every other day.    . Multiple Vitamins-Minerals (ICAPS) CAPS Take 2 capsules by mouth daily.    . naphazoline-pheniramine (NAPHCON-A) 0.025-0.3 % ophthalmic solution Place 1 drop into both eyes at bedtime.    . sennosides-docusate sodium (SENOKOT-S) 8.6-50 MG tablet Take 1 tablet by mouth 2 (two) times daily.    . simvastatin (ZOCOR) 10 MG tablet Take 5 mg by mouth at bedtime.      No current facility-administered medications on file prior to visit.     No Known Allergies  Objective: Physical Exam  General: Well developed, nourished, no acute distress, awake, alert and oriented x 3  Vascular: Dorsalis pedis artery, Very faint 1/4 bilateral, Posterior tibial artery , very faint 1/4 bilateral, skin temperature warm to warm proximal to distal bilateral lower extremities, + varicosities, no pedal hair present bilateral. +1 pitting edema bilateral.   Neurological: Gross sensation present via light touch bilateral. Protective and vibratory diminished bilateral.   Dermatological: Skin is warm, dry, and supple bilateral, Left>Right hallux nails are tender,  long, thick, and discolored with moderate subungal debris, no webspace macerations present bilateral, mild reactive calluses pre-ulcerative in nature, sub-met 1 and 5 bilateral. No acute signs of infection bilateral.  Musculoskeletal: Asymptomatic fat pad atrophy and hammertoe boney deformities noted bilateral. Muscular strength within normal limits without painon range of motion. No pain with calf compression bilateral.  Assessment and Plan:  Problem List Items Addressed This Visit    None    Visit  Diagnoses    Pain due to onychomycosis of toenails of both feet    -  Primary   Pre-ulcerative calluses       Diminished pulses in lower extremity       PVD (peripheral vascular disease) ([place])          -Examined patient.  -Discussed treatment options for painful mycotic nails. -Mechanically debrided pre-ulcerative callus bilateral using sterile chisel blade and reduced mycotic nails with sterile nail nipper and dremel nail file without incident. -Patient to return signed ABN at next visit, wants to discuss with wife -Will closely monitor feet since TBI is diminished and will consult vascular if acute changes are noted  -Patient to return after 3 months for follow up evaluation or sooner if symptoms worsen.  Landis Martins, DPM

## 2017-03-14 ENCOUNTER — Other Ambulatory Visit: Payer: Self-pay | Admitting: Cardiovascular Disease

## 2017-03-21 DIAGNOSIS — Z23 Encounter for immunization: Secondary | ICD-10-CM | POA: Diagnosis not present

## 2017-03-23 DIAGNOSIS — B351 Tinea unguium: Secondary | ICD-10-CM | POA: Diagnosis not present

## 2017-03-23 DIAGNOSIS — L570 Actinic keratosis: Secondary | ICD-10-CM | POA: Diagnosis not present

## 2017-03-23 DIAGNOSIS — I781 Nevus, non-neoplastic: Secondary | ICD-10-CM | POA: Diagnosis not present

## 2017-03-23 DIAGNOSIS — L821 Other seborrheic keratosis: Secondary | ICD-10-CM | POA: Diagnosis not present

## 2017-04-07 DIAGNOSIS — H353211 Exudative age-related macular degeneration, right eye, with active choroidal neovascularization: Secondary | ICD-10-CM | POA: Diagnosis not present

## 2017-04-15 DIAGNOSIS — C61 Malignant neoplasm of prostate: Secondary | ICD-10-CM | POA: Diagnosis not present

## 2017-04-22 ENCOUNTER — Other Ambulatory Visit (HOSPITAL_COMMUNITY): Payer: Self-pay | Admitting: Urology

## 2017-04-22 DIAGNOSIS — R351 Nocturia: Secondary | ICD-10-CM | POA: Diagnosis not present

## 2017-04-22 DIAGNOSIS — C61 Malignant neoplasm of prostate: Secondary | ICD-10-CM

## 2017-04-22 DIAGNOSIS — N401 Enlarged prostate with lower urinary tract symptoms: Secondary | ICD-10-CM | POA: Diagnosis not present

## 2017-04-26 ENCOUNTER — Ambulatory Visit (INDEPENDENT_AMBULATORY_CARE_PROVIDER_SITE_OTHER): Payer: Medicare Other | Admitting: *Deleted

## 2017-04-26 DIAGNOSIS — I442 Atrioventricular block, complete: Secondary | ICD-10-CM | POA: Diagnosis not present

## 2017-04-27 LAB — CUP PACEART REMOTE DEVICE CHECK
Battery Remaining Longevity: 132 mo
Battery Remaining Percentage: 95.5 %
Brady Statistic AS VP Percent: 85 %
Brady Statistic RA Percent Paced: 2.9 %
Date Time Interrogation Session: 20181120070605
Implantable Lead Implant Date: 20170818
Implantable Lead Location: 753859
Implantable Pulse Generator Implant Date: 20170818
Lead Channel Impedance Value: 510 Ohm
Lead Channel Pacing Threshold Pulse Width: 0.4 ms
Lead Channel Sensing Intrinsic Amplitude: 9.9 mV
Lead Channel Setting Pacing Amplitude: 2.5 V
MDC IDC LEAD IMPLANT DT: 20170818
MDC IDC LEAD LOCATION: 753860
MDC IDC MSMT BATTERY VOLTAGE: 3.01 V
MDC IDC MSMT LEADCHNL RA PACING THRESHOLD AMPLITUDE: 1.25 V
MDC IDC MSMT LEADCHNL RA SENSING INTR AMPL: 4.4 mV
MDC IDC MSMT LEADCHNL RV IMPEDANCE VALUE: 440 Ohm
MDC IDC MSMT LEADCHNL RV PACING THRESHOLD AMPLITUDE: 0.75 V
MDC IDC MSMT LEADCHNL RV PACING THRESHOLD PULSEWIDTH: 0.4 ms
MDC IDC SET LEADCHNL RV PACING AMPLITUDE: 1 V
MDC IDC SET LEADCHNL RV PACING PULSEWIDTH: 0.4 ms
MDC IDC SET LEADCHNL RV SENSING SENSITIVITY: 2 mV
MDC IDC STAT BRADY AP VP PERCENT: 3.4 %
MDC IDC STAT BRADY AP VS PERCENT: 1 %
MDC IDC STAT BRADY AS VS PERCENT: 11 %
MDC IDC STAT BRADY RV PERCENT PACED: 88 %
Pulse Gen Model: 2272
Pulse Gen Serial Number: 7939408

## 2017-04-27 NOTE — Progress Notes (Signed)
Remote pacemaker transmission.   

## 2017-05-05 ENCOUNTER — Encounter: Payer: Self-pay | Admitting: Cardiology

## 2017-05-05 DIAGNOSIS — H353211 Exudative age-related macular degeneration, right eye, with active choroidal neovascularization: Secondary | ICD-10-CM | POA: Diagnosis not present

## 2017-05-09 ENCOUNTER — Other Ambulatory Visit: Payer: Self-pay | Admitting: Cardiovascular Disease

## 2017-05-10 ENCOUNTER — Encounter (HOSPITAL_COMMUNITY)
Admission: RE | Admit: 2017-05-10 | Discharge: 2017-05-10 | Disposition: A | Discharge status: Home / Self Care | Payer: Medicare Other | Source: Ambulatory Visit | Attending: Urology | Admitting: Urology

## 2017-05-10 DIAGNOSIS — C61 Malignant neoplasm of prostate: Secondary | ICD-10-CM | POA: Diagnosis not present

## 2017-05-10 DIAGNOSIS — N400 Enlarged prostate without lower urinary tract symptoms: Secondary | ICD-10-CM | POA: Diagnosis not present

## 2017-05-10 MED ORDER — TECHNETIUM TC 99M MEDRONATE IV KIT
21.4000 | PACK | Freq: Once | INTRAVENOUS | Status: AC | PRN
  Administered 2017-05-10: 21.4 via INTRAVENOUS

## 2017-05-20 ENCOUNTER — Encounter: Payer: Medicare Other | Admitting: Internal Medicine

## 2017-05-20 ENCOUNTER — Encounter: Payer: Self-pay | Admitting: Nurse Practitioner

## 2017-05-20 ENCOUNTER — Ambulatory Visit (INDEPENDENT_AMBULATORY_CARE_PROVIDER_SITE_OTHER): Payer: Medicare Other | Admitting: Nurse Practitioner

## 2017-05-20 VITALS — BP 149/73 | HR 70 | Ht 72.0 in | Wt 211.8 lb

## 2017-05-20 DIAGNOSIS — Z950 Presence of cardiac pacemaker: Secondary | ICD-10-CM | POA: Diagnosis not present

## 2017-05-20 DIAGNOSIS — I442 Atrioventricular block, complete: Secondary | ICD-10-CM

## 2017-05-20 DIAGNOSIS — R55 Syncope and collapse: Secondary | ICD-10-CM | POA: Diagnosis not present

## 2017-05-20 MED ORDER — CARVEDILOL 6.25 MG PO TABS: 6.2500 mg | ORAL_TABLET | Freq: Two times a day (BID) | ORAL | 3 refills | Status: AC

## 2017-05-20 MED ORDER — LISINOPRIL 5 MG PO TABS: 5.0000 mg | ORAL_TABLET | Freq: Every day | ORAL | 3 refills | Status: AC

## 2017-05-20 NOTE — Progress Notes (Signed)
Electrophysiology Office Note Date: 05/20/2017  ID:  Anthony Osborn, DOB 04-04-1920, MRN [id number]  PCP: Cyndy Freeze, MD Electrophysiologist: Lovena Le  CC: Pacemaker follow-up  Anthony Osborn is a 83 y.o. male seen today for Dr Lovena Le.  He presents today for routine electrophysiology followup.  Since last being seen in our clinic, the patient reports doing very well.  He denies chest pain, palpitations, dyspnea, PND, orthopnea, nausea, vomiting, dizziness, syncope, edema, weight gain, or early satiety.  He remains very active. He is concerned about his blood pressures at home - has been 672'C systolic.   Device History: STJ dual chamber PPM implanted 2017 for complete heart block    Past Medical History:  Diagnosis Date  . CAD (coronary artery disease)   . Depression    "after 1st wife died"  . DVT (deep venous thrombosis) ([place]) 04/2015  . Hyperlipidemia   . Hypertension   . Hyperthyroidism   . Ischemic cardiomyopathy   . Prostate cancer ([place])    3 YEARS AGO(BEING OBSERVED)  . Pulmonary embolism ([place]) 04/2015   Past Surgical History:  Procedure Laterality Date  . CARDIAC CATHETERIZATION N/A 01/22/2016   Procedure: Temporary Pacemaker;  Surgeon: Belva Crome, MD;  Location: [facility];  Service: Cardiovascular;  Laterality: N/A;  . CATARACT EXTRACTION W/ INTRAOCULAR LENS  IMPLANT, BILATERAL Bilateral   . CORONARY ARTERY BYPASS GRAFT  03/2011   triple  . EP IMPLANTABLE DEVICE N/A 01/23/2016   Procedure: Pacemaker Implant;  Surgeon: Evans Lance, MD;  Location: [facility];  Service: Cardiovascular;  Laterality: N/A;  . INCISION AND DRAINAGE Right 04/28/2015   Procedure: INCISION AND DRAINAGE;  Surgeon: Vickey Huger, MD;  Location: [facility];  Service: Orthopedics;  Laterality: Right;  . JOINT REPLACEMENT    . LAPAROSCOPIC CHOLECYSTECTOMY    . PROSTATE BIOPSY  ~ 2010  . SHOULDER OPEN ROTATOR CUFF REPAIR Left   . TOTAL KNEE ARTHROPLASTY Bilateral   . TOTAL KNEE  REVISION Right 04/28/2015   Procedure: TOTAL KNEE REVISION;  Surgeon: Vickey Huger, MD;  Location: [facility];  Service: Orthopedics;  Laterality: Right;    Current Outpatient Medications  Medication Sig Dispense Refill  . acetaminophen (TYLENOL) 325 MG tablet Take 2 tablets (650 mg total) by mouth every 6 (six) hours as needed for mild pain (or Fever >/= 101).    Marland Kitchen aspirin EC 81 MG tablet Take 81 mg by mouth every evening.     Marland Kitchen CALCIUM-MAGNESIUM-ZINC PO Take 1 tablet by mouth 2 (two) times daily. Reported on 12/15/2015    . carvedilol (COREG) 6.25 MG tablet Take 1 tablet (6.25 mg total) by mouth 2 (two) times daily. 180 tablet 3  . Cholecalciferol (VITAMIN D-3 PO) Take 1,000 Units by mouth daily.      . furosemide (LASIX) 40 MG tablet TAKE ONE TABLET BY MOUTH DAILY 30 tablet 6  . lisinopril (PRINIVIL,ZESTRIL) 5 MG tablet Take 1 tablet (5 mg total) by mouth daily. Please make overdue yearly appt with Dr. Angelena Form before anymore refills. 1st attempt 90 tablet 3  . loratadine (CLARITIN) 10 MG tablet Take 10 mg by mouth at bedtime.    . Multiple Vitamins-Minerals (ICAPS) CAPS Take 2 capsules by mouth daily.    . naphazoline-pheniramine (NAPHCON-A) 0.025-0.3 % ophthalmic solution Place 1 drop into both eyes at bedtime.    . simvastatin (ZOCOR) 10 MG tablet Take 5 mg by mouth at bedtime.      No current facility-administered medications for  this visit.     Allergies:   Patient has no known allergies.   Social History: Social History   Socioeconomic History  . Marital status: Married    Spouse name: Not on file  . Number of children: Not on file  . Years of education: Not on file  . Highest education level: Not on file  Social Needs  . Financial resource strain: Not on file  . Food insecurity - worry: Not on file  . Food insecurity - inability: Not on file  . Transportation needs - medical: Not on file  . Transportation needs - non-medical: Not on file  Occupational History  . Not on file    Tobacco Use  . Smoking status: Never Smoker  . Smokeless tobacco: Never Used  Substance and Sexual Activity  . Alcohol use: No    Alcohol/week: 0.0 oz  . Drug use: No  . Sexual activity: No  Other Topics Concern  . Not on file  Social History Narrative  . Not on file    Family History: Family History  Problem Relation Age of Onset  . Stroke Father   . Heart disease Brother   . Diabetes Sister      Review of Systems: All other systems reviewed and are otherwise negative except as noted above.   Physical Exam: VS:  BP (!) 149/73   Pulse 70   Ht 6' (1.829 m)   Wt 211 lb 12.8 oz (96.1 kg)   SpO2 95%   BMI 28.73 kg/m  , BMI Body mass index is 28.73 kg/m.  GEN- The patient is elderly appearing, alert and oriented x 3 today.   HEENT: normocephalic, atraumatic; sclera clear, conjunctiva pink; hearing intact; oropharynx clear; neck supple  Lungs- Clear to ausculation bilaterally, normal work of breathing.  No wheezes, rales, rhonchi Heart- Regular rate and rhythm (paced) GI- soft, non-tender, non-distended, bowel sounds present  Extremities- no clubbing, cyanosis, +dependent edema MS- no significant deformity or atrophy Skin- warm and dry, no rash or lesion; PPM pocket well healed Psych- euthymic mood, full affect Neuro- strength and sensation are intact  PPM Interrogation- reviewed in detail today,  See PACEART report  EKG:  EKG is not ordered today.  Recent Labs: No results found for requested labs within last 8760 hours.   Wt Readings from Last 3 Encounters:  05/20/17 211 lb 12.8 oz (96.1 kg)  05/18/16 203 lb 12.8 oz (92.4 kg)  04/27/16 205 lb 9.6 oz (93.3 kg)     Other studies Reviewed: Additional studies/ records that were reviewed today include: Dr Tanna Furry office notes  Assessment and Plan:  1.  Complete heart block  Normal PPM function See Pace Art report No changes today  2.  HTN BP elevated at home recently Will increase Coreg to 6.25mg   twice daily Continue Lisinopril/Lasix [facility] states [facility]    Current medicines are reviewed at length with the patient today.   The patient does not have concerns regarding his medicines.  The following changes were made today:  none  Labs/ tests ordered today include: none No orders of the defined types were placed in this encounter.    Disposition:   Follow up with Delilah Shan, Dr Lovena Le 1 year    Signed, Chanetta Marshall, NP 05/20/2017 2:55 PM  [facility] [address] [place] [address] [phone] (office) [phone] (fax)

## 2017-05-20 NOTE — Patient Instructions (Addendum)
Medication Instructions:  Your physician has recommended you make the following change in your medication:  1) Increase Carvedilol to 6.25 mg twice daily   Labwork: None ordered   Testing/Procedures: None ordered   Follow-Up: Remote monitoring is used to monitor your Pacemaker of from home. This monitoring reduces the number of office visits required to check your device to one time per year. It allows [place] to keep an eye on the functioning of your device to ensure it is working properly. You are scheduled for a device check from home on 07/26/17. You may send your transmission at any time that day. If you have a wireless device, the transmission will be sent automatically. After your physician reviews your transmission, you will receive a postcard with your next transmission date.  Your physician wants you to follow-up in: 12 months with Dr Knox Saliva will receive a reminder letter in the mail two months in advance. If you don't receive a letter, please call our office to schedule the follow-up appointment.      Any Other Special Instructions Will Be Listed Below (If Applicable).    Check medications when you get home and call if different

## 2017-05-27 ENCOUNTER — Ambulatory Visit (INDEPENDENT_AMBULATORY_CARE_PROVIDER_SITE_OTHER): Payer: Medicare Other | Admitting: Sports Medicine

## 2017-05-27 ENCOUNTER — Encounter: Payer: Self-pay | Admitting: Sports Medicine

## 2017-05-27 DIAGNOSIS — L84 Corns and callosities: Secondary | ICD-10-CM | POA: Diagnosis not present

## 2017-05-27 DIAGNOSIS — I739 Peripheral vascular disease, unspecified: Secondary | ICD-10-CM | POA: Diagnosis not present

## 2017-05-27 DIAGNOSIS — M79674 Pain in right toe(s): Secondary | ICD-10-CM | POA: Diagnosis not present

## 2017-05-27 DIAGNOSIS — R0989 Other specified symptoms and signs involving the circulatory and respiratory systems: Secondary | ICD-10-CM

## 2017-05-27 DIAGNOSIS — M79675 Pain in left toe(s): Secondary | ICD-10-CM | POA: Diagnosis not present

## 2017-05-27 DIAGNOSIS — B351 Tinea unguium: Secondary | ICD-10-CM | POA: Diagnosis not present

## 2017-05-27 NOTE — Progress Notes (Signed)
Subjective: Alejo Beamer is a 83 y.o. male patient seen today in office with complaint of painful thickened and elongated toenails and callus; unable to trim. Patient has no other pedal complaints at this time.   Patient Active Problem List   Diagnosis Date Noted  . Cellulitis 05/16/2016  . Cellulitis of left lower extremity 05/13/2016  . Leg edema, left 05/13/2016  . Aftercare following surgery 04/27/2016  . Ingrown nail 04/20/2016  . Subungual hematoma of great toe of right foot 04/20/2016  . Ulcer of right foot, limited to breakdown of skin ([place]) 03/11/2016  . Cardiomyopathy, ischemic-EF 25-30% 01/24/2016  . Pacemaker (St Jude) implanted 01/23/16 01/24/2016  . Syncope 01/22/2016  . Third degree AV block ([place]) 01/22/2016  . Onychomycosis due to dermatophyte 01/20/2016  . Status post revision of total replacement of right knee 05/12/2015  . Pseudomonas infection   . DVT (deep venous thrombosis) ([place]) 04/27/2015  . Pyogenic arthritis of right knee joint ([place])   . Essential hypertension   . Encounter for palliative care   . Hx of CABG 2012 04/21/2015  . Septic arthritis of knee, right ([place]) 04/21/2015  . Cough 04/21/2015  . Hyperlipidemia   . Prostate cancer ([place])   . Hyperthyroidism     Current Outpatient Medications on File Prior to Visit  Medication Sig Dispense Refill  . acetaminophen (TYLENOL) 325 MG tablet Take 2 tablets (650 mg total) by mouth every 6 (six) hours as needed for mild pain (or Fever >/= 101).    Marland Kitchen aspirin EC 81 MG tablet Take 81 mg by mouth every evening.     Marland Kitchen CALCIUM-MAGNESIUM-ZINC PO Take 1 tablet by mouth 2 (two) times daily. Reported on 12/15/2015    . carvedilol (COREG) 6.25 MG tablet Take 1 tablet (6.25 mg total) by mouth 2 (two) times daily. 180 tablet 3  . Cholecalciferol (VITAMIN D-3 PO) Take 1,000 Units by mouth daily.      . furosemide (LASIX) 40 MG tablet TAKE ONE TABLET BY MOUTH DAILY 30 tablet 6  . lisinopril (PRINIVIL,ZESTRIL) 5 MG tablet  Take 1 tablet (5 mg total) by mouth daily. Please make overdue yearly appt with Dr. Angelena Form before anymore refills. 1st attempt 90 tablet 3  . loratadine (CLARITIN) 10 MG tablet Take 10 mg by mouth at bedtime.    . Multiple Vitamins-Minerals (ICAPS) CAPS Take 2 capsules by mouth daily.    . naphazoline-pheniramine (NAPHCON-A) 0.025-0.3 % ophthalmic solution Place 1 drop into both eyes at bedtime.    . simvastatin (ZOCOR) 10 MG tablet Take 5 mg by mouth at bedtime.      No current facility-administered medications on file prior to visit.     No Known Allergies  Objective: Physical Exam  General: Well developed, nourished, no acute distress, awake, alert and oriented x 3  Vascular: Dorsalis pedis artery, Very faint 1/4 bilateral, Posterior tibial artery , very faint 1/4 bilateral, skin temperature warm to warm proximal to distal bilateral lower extremities, + varicosities, no pedal hair present bilateral. +1 pitting edema bilateral.   Neurological: Gross sensation present via light touch bilateral. Protective and vibratory diminished bilateral.   Dermatological: Skin is warm, dry, and supple bilateral, Left>Right hallux nails are tender, long, thick, and discolored with moderate subungal debris, no webspace macerations present bilateral, mild reactive calluses pre-ulcerative in nature, sub-met 1 and 5 bilateral. No acute signs of infection bilateral.  Musculoskeletal: Asymptomatic fat pad atrophy and hammertoe boney deformities noted bilateral. Muscular strength within normal limits without painon  range of motion. No pain with calf compression bilateral.  Assessment and Plan:  Problem List Items Addressed This Visit    None    Visit Diagnoses    Pain due to onychomycosis of toenails of both feet    -  Primary   Pre-ulcerative calluses       Diminished pulses in lower extremity       PVD (peripheral vascular disease) ([place])         -Examined patient.  -Discussed treatment options for  painful mycotic nails. -Mechanically debrided pre-ulcerative callus bilateral using sterile chisel blade and reduced mycotic nails with sterile nail nipper and dremel nail file without incident. -Advised patient to apply neosporin to these pre-ulcerative areas if any bleeding is noted -Will continue to closely monitor feet since TBI is diminished and will consult vascular if acute changes are noted  -Patient to return after 3 months for follow up evaluation or sooner if symptoms worsen.  Landis Martins, DPM

## 2017-06-01 DIAGNOSIS — L0390 Cellulitis, unspecified: Secondary | ICD-10-CM | POA: Diagnosis not present

## 2017-06-01 DIAGNOSIS — L723 Sebaceous cyst: Secondary | ICD-10-CM | POA: Diagnosis not present

## 2017-06-09 DIAGNOSIS — H353211 Exudative age-related macular degeneration, right eye, with active choroidal neovascularization: Secondary | ICD-10-CM | POA: Diagnosis not present

## 2017-06-29 LAB — CUP PACEART INCLINIC DEVICE CHECK
Date Time Interrogation Session: 20190123084407
Implantable Lead Implant Date: 20170818
Implantable Lead Location: 753860
MDC IDC LEAD IMPLANT DT: 20170818
MDC IDC LEAD LOCATION: 753859
MDC IDC PG IMPLANT DT: 20170818
MDC IDC PG SERIAL: 7939408

## 2017-07-11 ENCOUNTER — Other Ambulatory Visit: Payer: Self-pay | Admitting: Cardiovascular Disease

## 2017-07-13 DIAGNOSIS — L02219 Cutaneous abscess of trunk, unspecified: Secondary | ICD-10-CM | POA: Diagnosis not present

## 2017-07-13 DIAGNOSIS — I781 Nevus, non-neoplastic: Secondary | ICD-10-CM | POA: Diagnosis not present

## 2017-07-18 DIAGNOSIS — M25561 Pain in right knee: Secondary | ICD-10-CM | POA: Diagnosis not present

## 2017-07-21 DIAGNOSIS — H353211 Exudative age-related macular degeneration, right eye, with active choroidal neovascularization: Secondary | ICD-10-CM | POA: Diagnosis not present

## 2017-07-26 ENCOUNTER — Ambulatory Visit (INDEPENDENT_AMBULATORY_CARE_PROVIDER_SITE_OTHER): Payer: Medicare Other | Admitting: *Deleted

## 2017-07-26 DIAGNOSIS — I442 Atrioventricular block, complete: Secondary | ICD-10-CM | POA: Diagnosis not present

## 2017-07-26 NOTE — Progress Notes (Signed)
Remote pacemaker transmission.   

## 2017-07-27 LAB — CUP PACEART REMOTE DEVICE CHECK
Brady Statistic AP VS Percent: 1 %
Brady Statistic AS VP Percent: 98 %
Brady Statistic RV Percent Paced: 99 %
Date Time Interrogation Session: 20190219074917
Implantable Lead Location: 753860
Lead Channel Impedance Value: 490 Ohm
Lead Channel Pacing Threshold Amplitude: 0.625 V
Lead Channel Pacing Threshold Pulse Width: 0.4 ms
Lead Channel Sensing Intrinsic Amplitude: 5.7 mV
Lead Channel Setting Pacing Amplitude: 0.875
Lead Channel Setting Pacing Amplitude: 2.5 V
Lead Channel Setting Pacing Pulse Width: 0.4 ms
MDC IDC LEAD IMPLANT DT: 20170818
MDC IDC LEAD IMPLANT DT: 20170818
MDC IDC LEAD LOCATION: 753859
MDC IDC MSMT BATTERY REMAINING LONGEVITY: 132 mo
MDC IDC MSMT BATTERY REMAINING PERCENTAGE: 95.5 %
MDC IDC MSMT BATTERY VOLTAGE: 3.01 V
MDC IDC MSMT LEADCHNL RA PACING THRESHOLD AMPLITUDE: 1 V
MDC IDC MSMT LEADCHNL RA PACING THRESHOLD PULSEWIDTH: 0.4 ms
MDC IDC MSMT LEADCHNL RA SENSING INTR AMPL: 3.6 mV
MDC IDC MSMT LEADCHNL RV IMPEDANCE VALUE: 440 Ohm
MDC IDC PG IMPLANT DT: 20170818
MDC IDC SET LEADCHNL RV SENSING SENSITIVITY: 2 mV
MDC IDC STAT BRADY AP VP PERCENT: 1.4 %
MDC IDC STAT BRADY AS VS PERCENT: 1 %
MDC IDC STAT BRADY RA PERCENT PACED: 1.2 %
Pulse Gen Model: 2272
Pulse Gen Serial Number: 7939408

## 2017-07-28 ENCOUNTER — Encounter: Payer: Self-pay | Admitting: Cardiology

## 2017-08-22 DIAGNOSIS — R350 Frequency of micturition: Secondary | ICD-10-CM | POA: Diagnosis not present

## 2017-08-22 DIAGNOSIS — R351 Nocturia: Secondary | ICD-10-CM | POA: Diagnosis not present

## 2017-08-22 DIAGNOSIS — C61 Malignant neoplasm of prostate: Secondary | ICD-10-CM | POA: Diagnosis not present

## 2017-08-22 DIAGNOSIS — C7951 Secondary malignant neoplasm of bone: Secondary | ICD-10-CM | POA: Diagnosis not present

## 2017-08-26 ENCOUNTER — Ambulatory Visit (INDEPENDENT_AMBULATORY_CARE_PROVIDER_SITE_OTHER): Payer: Medicare Other | Admitting: Sports Medicine

## 2017-08-26 ENCOUNTER — Encounter: Payer: Self-pay | Admitting: Sports Medicine

## 2017-08-26 DIAGNOSIS — B351 Tinea unguium: Secondary | ICD-10-CM | POA: Diagnosis not present

## 2017-08-26 DIAGNOSIS — M79675 Pain in left toe(s): Secondary | ICD-10-CM | POA: Diagnosis not present

## 2017-08-26 DIAGNOSIS — R0989 Other specified symptoms and signs involving the circulatory and respiratory systems: Secondary | ICD-10-CM | POA: Diagnosis not present

## 2017-08-26 DIAGNOSIS — I739 Peripheral vascular disease, unspecified: Secondary | ICD-10-CM

## 2017-08-26 DIAGNOSIS — L84 Corns and callosities: Secondary | ICD-10-CM

## 2017-08-26 DIAGNOSIS — M79674 Pain in right toe(s): Secondary | ICD-10-CM | POA: Diagnosis not present

## 2017-08-26 NOTE — Progress Notes (Signed)
Subjective: Anthony Osborn is a 84 y.o. male patient seen today in office with complaint of painful thickened and elongated toenails and callus; unable to trim. Patient has no other pedal complaints at this time.   Patient Active Problem List   Diagnosis Date Noted  . Cellulitis 05/16/2016  . Cellulitis of left lower extremity 05/13/2016  . Leg edema, left 05/13/2016  . Aftercare following surgery 04/27/2016  . Ingrown nail 04/20/2016  . Subungual hematoma of great toe of right foot 04/20/2016  . Ulcer of right foot, limited to breakdown of skin ([place]) 03/11/2016  . Cardiomyopathy, ischemic-EF 25-30% 01/24/2016  . Pacemaker (St Jude) implanted 01/23/16 01/24/2016  . Syncope 01/22/2016  . Third degree AV block ([place]) 01/22/2016  . Onychomycosis due to dermatophyte 01/20/2016  . Status post revision of total replacement of right knee 05/12/2015  . Pseudomonas infection   . DVT (deep venous thrombosis) ([place]) 04/27/2015  . Pyogenic arthritis of right knee joint ([place])   . Essential hypertension   . Encounter for palliative care   . Hx of CABG 2012 04/21/2015  . Septic arthritis of knee, right ([place]) 04/21/2015  . Cough 04/21/2015  . Hyperlipidemia   . Prostate cancer ([place])   . Hyperthyroidism     Current Outpatient Medications on File Prior to Visit  Medication Sig Dispense Refill  . acetaminophen (TYLENOL) 325 MG tablet Take 2 tablets (650 mg total) by mouth every 6 (six) hours as needed for mild pain (or Fever >/= 101).    Marland Kitchen aspirin EC 81 MG tablet Take 81 mg by mouth every evening.     Marland Kitchen CALCIUM-MAGNESIUM-ZINC PO Take 1 tablet by mouth 2 (two) times daily. Reported on 12/15/2015    . carvedilol (COREG) 6.25 MG tablet Take 1 tablet (6.25 mg total) by mouth 2 (two) times daily. 180 tablet 3  . Cholecalciferol (VITAMIN D-3 PO) Take 1,000 Units by mouth daily.      . furosemide (LASIX) 40 MG tablet TAKE ONE TABLET BY MOUTH ONCE DAILY 30 tablet 10  . lisinopril (PRINIVIL,ZESTRIL) 5 MG  tablet Take 1 tablet (5 mg total) by mouth daily. Please make overdue yearly appt with Dr. Angelena Form before anymore refills. 1st attempt 90 tablet 3  . loratadine (CLARITIN) 10 MG tablet Take 10 mg by mouth at bedtime.    . Multiple Vitamins-Minerals (ICAPS) CAPS Take 2 capsules by mouth daily.    . naphazoline-pheniramine (NAPHCON-A) 0.025-0.3 % ophthalmic solution Place 1 drop into both eyes at bedtime.    . simvastatin (ZOCOR) 10 MG tablet Take 5 mg by mouth at bedtime.      No current facility-administered medications on file prior to visit.     No Known Allergies  Objective: Physical Exam  General: Well developed, nourished, no acute distress, awake, alert and oriented x 3  Vascular: Dorsalis pedis artery, Very faint 1/4 bilateral, Posterior tibial artery , very faint 1/4 bilateral, skin temperature warm to warm proximal to distal bilateral lower extremities, + varicosities, no pedal hair present bilateral. +1 pitting edema bilateral.   Neurological: Gross sensation present via light touch bilateral. Protective and vibratory diminished bilateral.   Dermatological: Skin is warm, dry, and supple bilateral, Left>Right hallux nails are tender, long, thick, and discolored with moderate subungal debris, no webspace macerations present bilateral, mild reactive calluses pre-ulcerative in nature, sub-met 1 and 5 bilateral.  Today on exam right sub-met 5 callus had significant amount of bloody drainage which was evacuated doing treatment today and advised patient to  closely monitor area and to apply Betadine dressing however there are no acute signs of infection bilateral.  Musculoskeletal: Asymptomatic fat pad atrophy and hammertoe boney deformities noted bilateral. Muscular strength within normal limits without painon range of motion. No pain with calf compression bilateral.  Assessment and Plan:  Problem List Items Addressed This Visit    None    Visit Diagnoses    Pain due to onychomycosis  of toenails of both feet    -  Primary   Pre-ulcerative calluses       Diminished pulses in lower extremity       PVD (peripheral vascular disease) ([place])         -Examined patient.  -ABN signed -Discussed treatment options for painful mycotic nails and pre-ulcerative calluses. -Mechanically debrided pre-ulcerative callus bilateral using sterile chisel blade and reduced mycotic nails with sterile nail nipper and dremel nail file without incident. -Advised patient to apply Betadine to right sub met 5 callus since pre-ulcerative and had excessive bloody drainage from the area today.  Advised patient to watch closely for any signs of infection if occurs to return to office or ER immediately. -Dispensed offloading padding to use as instructed to pre-ulcerative areas -Will continue to closely monitor feet since TBI is diminished and will consult vascular if acute changes are noted  -Patient to return after 3 months for follow up evaluation or sooner if symptoms worsen.  Landis Martins, DPM

## 2017-09-08 DIAGNOSIS — H353211 Exudative age-related macular degeneration, right eye, with active choroidal neovascularization: Secondary | ICD-10-CM | POA: Diagnosis not present

## 2017-10-10 DIAGNOSIS — I255 Ischemic cardiomyopathy: Secondary | ICD-10-CM | POA: Diagnosis not present

## 2017-10-10 DIAGNOSIS — Z951 Presence of aortocoronary bypass graft: Secondary | ICD-10-CM | POA: Diagnosis not present

## 2017-10-10 DIAGNOSIS — C61 Malignant neoplasm of prostate: Secondary | ICD-10-CM | POA: Diagnosis not present

## 2017-10-10 DIAGNOSIS — R5382 Chronic fatigue, unspecified: Secondary | ICD-10-CM | POA: Insufficient documentation

## 2017-10-10 DIAGNOSIS — E0590 Thyrotoxicosis, unspecified without thyrotoxic crisis or storm: Secondary | ICD-10-CM | POA: Diagnosis not present

## 2017-10-10 DIAGNOSIS — Z950 Presence of cardiac pacemaker: Secondary | ICD-10-CM | POA: Diagnosis not present

## 2017-10-10 DIAGNOSIS — D539 Nutritional anemia, unspecified: Secondary | ICD-10-CM | POA: Diagnosis not present

## 2017-10-10 DIAGNOSIS — E782 Mixed hyperlipidemia: Secondary | ICD-10-CM | POA: Diagnosis not present

## 2017-10-13 DIAGNOSIS — D539 Nutritional anemia, unspecified: Secondary | ICD-10-CM | POA: Diagnosis not present

## 2017-10-19 DIAGNOSIS — E875 Hyperkalemia: Secondary | ICD-10-CM | POA: Diagnosis not present

## 2017-10-20 DIAGNOSIS — H353211 Exudative age-related macular degeneration, right eye, with active choroidal neovascularization: Secondary | ICD-10-CM | POA: Diagnosis not present

## 2017-10-21 DIAGNOSIS — D539 Nutritional anemia, unspecified: Secondary | ICD-10-CM | POA: Insufficient documentation

## 2017-10-25 ENCOUNTER — Ambulatory Visit (INDEPENDENT_AMBULATORY_CARE_PROVIDER_SITE_OTHER): Payer: Medicare Other | Admitting: *Deleted

## 2017-10-25 ENCOUNTER — Telehealth: Payer: Self-pay | Admitting: Cardiology

## 2017-10-25 DIAGNOSIS — I442 Atrioventricular block, complete: Secondary | ICD-10-CM

## 2017-10-25 NOTE — Telephone Encounter (Signed)
LMOVM reminding pt to send remote transmission.   

## 2017-10-26 NOTE — Progress Notes (Signed)
Remote pacemaker transmission.   

## 2017-10-27 ENCOUNTER — Encounter: Payer: Self-pay | Admitting: Cardiology

## 2017-10-27 LAB — CUP PACEART REMOTE DEVICE CHECK
Battery Remaining Percentage: 95.5 %
Battery Voltage: 2.99 V
Brady Statistic AP VS Percent: 1 %
Brady Statistic AS VP Percent: 98 %
Brady Statistic RV Percent Paced: 99 %
Implantable Lead Implant Date: 20170818
Implantable Lead Implant Date: 20170818
Implantable Lead Location: 753859
Implantable Lead Location: 753860
Lead Channel Pacing Threshold Amplitude: 0.75 V
Lead Channel Sensing Intrinsic Amplitude: 12 mV
Lead Channel Sensing Intrinsic Amplitude: 3.3 mV
Lead Channel Setting Pacing Amplitude: 2.5 V
Lead Channel Setting Pacing Pulse Width: 0.4 ms
Lead Channel Setting Sensing Sensitivity: 2 mV
MDC IDC MSMT BATTERY REMAINING LONGEVITY: 133 mo
MDC IDC MSMT LEADCHNL RA IMPEDANCE VALUE: 510 Ohm
MDC IDC MSMT LEADCHNL RA PACING THRESHOLD AMPLITUDE: 1 V
MDC IDC MSMT LEADCHNL RA PACING THRESHOLD PULSEWIDTH: 0.4 ms
MDC IDC MSMT LEADCHNL RV IMPEDANCE VALUE: 490 Ohm
MDC IDC MSMT LEADCHNL RV PACING THRESHOLD PULSEWIDTH: 0.4 ms
MDC IDC PG IMPLANT DT: 20170818
MDC IDC SESS DTM: 20190521193807
MDC IDC SET LEADCHNL RV PACING AMPLITUDE: 1 V
MDC IDC STAT BRADY AP VP PERCENT: 1.6 %
MDC IDC STAT BRADY AS VS PERCENT: 1 %
MDC IDC STAT BRADY RA PERCENT PACED: 1.4 %
Pulse Gen Model: 2272
Pulse Gen Serial Number: 7939408

## 2017-11-16 ENCOUNTER — Telehealth: Payer: Self-pay | Admitting: Internal Medicine

## 2017-11-16 DIAGNOSIS — Z8679 Personal history of other diseases of the circulatory system: Secondary | ICD-10-CM | POA: Diagnosis not present

## 2017-11-16 DIAGNOSIS — C61 Malignant neoplasm of prostate: Secondary | ICD-10-CM | POA: Diagnosis not present

## 2017-11-16 DIAGNOSIS — C7951 Secondary malignant neoplasm of bone: Secondary | ICD-10-CM | POA: Diagnosis not present

## 2017-11-16 NOTE — Telephone Encounter (Signed)
Called patient, since granddaughter is not on the [facility] list. Patient stated he has been SOB after his fall, and patient fell about a week ago. Patient complaining of BLE as well. Made the next available appointment with Renee PA on 12/16/17. Informed patient's wife that patient needs to call his PCP in the mean time to get seen and evaluated, since patient has not been seen by anyone after falling. Will forward to Dr. Lovena Le and his nurse to see if patient can be seen any earlier and further advisement.

## 2017-11-16 NOTE — Telephone Encounter (Signed)
Spoke with Pt. Advised per Dr. Lovena Le he should call his PCP to be seen this week d/t recent fall.  Also advised to increase lasix to 2 tablets by mouth daily for 3 days.  Will call Pt Friday.  If not significantly better after increasing lasix will attempt to move Pt appt sooner than July.  Cont to monitor.

## 2017-11-16 NOTE — Telephone Encounter (Signed)
Follow up    Pt's granddaughter is calling to check on the response for his SOB and states the pt is 89 and needs to be seen asap. Please call

## 2017-11-16 NOTE — Telephone Encounter (Signed)
New Message   Pt c/o Shortness Of Breath: STAT if SOB developed within the last 24 hours or pt is noticeably SOB on the phone  1. Are you currently SOB (can you hear that pt is SOB on the phone)? The patient is sob, spoke with the granddaughter Melissa  2. How long have you been experiencing SOB? For about week  3. Are you SOB when sitting or when up moving around? When sitting or moving   4. Are you currently experiencing any other symptoms? Fluid buildup.   Patients granddaughter is calling on behalf of patient. She indicates that the patient has been receiving lupron injections for prostate cancer. She says he is very weak. They took him to the urologist and they told them that he needed to see his cardiologist because the side effects is fluid build up and shortness of breath. They advised that he is showing more fluid buildup around his heart which is contributing to the shortness of breath.

## 2017-11-17 ENCOUNTER — Telehealth: Payer: Self-pay | Admitting: Internal Medicine

## 2017-11-17 DIAGNOSIS — D539 Nutritional anemia, unspecified: Secondary | ICD-10-CM | POA: Diagnosis not present

## 2017-11-17 DIAGNOSIS — R0789 Other chest pain: Secondary | ICD-10-CM | POA: Diagnosis not present

## 2017-11-17 DIAGNOSIS — R0602 Shortness of breath: Secondary | ICD-10-CM | POA: Diagnosis not present

## 2017-11-17 DIAGNOSIS — W19XXXA Unspecified fall, initial encounter: Secondary | ICD-10-CM | POA: Diagnosis not present

## 2017-11-17 DIAGNOSIS — R29898 Other symptoms and signs involving the musculoskeletal system: Secondary | ICD-10-CM | POA: Diagnosis not present

## 2017-11-17 DIAGNOSIS — R5382 Chronic fatigue, unspecified: Secondary | ICD-10-CM | POA: Diagnosis not present

## 2017-11-17 DIAGNOSIS — R54 Age-related physical debility: Secondary | ICD-10-CM | POA: Diagnosis not present

## 2017-11-17 DIAGNOSIS — C61 Malignant neoplasm of prostate: Secondary | ICD-10-CM | POA: Diagnosis not present

## 2017-11-17 DIAGNOSIS — S20211A Contusion of right front wall of thorax, initial encounter: Secondary | ICD-10-CM | POA: Diagnosis not present

## 2017-11-17 NOTE — Telephone Encounter (Signed)
New conversation thread started.  Will close this thread.

## 2017-11-17 NOTE — Telephone Encounter (Signed)
Returned call to Pt.  Received verbal permission to speak with his granddaughter.  Per granddaughter Pt is at his PCP office right now.  Advised to have his visit notes faxed to our office asap to determine if he needs to be seen by Dr. Lovena Le.  Fax # given.  Granddaughter will ask PCP to fax.  Cont to monitor.

## 2017-11-17 NOTE — Telephone Encounter (Signed)
New Message:      Pt's granddaughter is calling and states that pt has an appt with [facility] and states they will be sending over a record req. She states the pt would really like to see Lovena Le but they can not wait until July to see anyone. She states if they wait that long they don't think he will be here. She states if we can see him before next week then let them know. She states she knows she is not on the [facility] list but the pt and the pt's wife is with her.

## 2017-11-18 DIAGNOSIS — Z96659 Presence of unspecified artificial knee joint: Secondary | ICD-10-CM | POA: Diagnosis not present

## 2017-11-18 DIAGNOSIS — Z79899 Other long term (current) drug therapy: Secondary | ICD-10-CM | POA: Diagnosis not present

## 2017-11-18 DIAGNOSIS — I5023 Acute on chronic systolic (congestive) heart failure: Secondary | ICD-10-CM | POA: Diagnosis present

## 2017-11-18 DIAGNOSIS — I2510 Atherosclerotic heart disease of native coronary artery without angina pectoris: Secondary | ICD-10-CM | POA: Diagnosis not present

## 2017-11-18 DIAGNOSIS — I110 Hypertensive heart disease with heart failure: Secondary | ICD-10-CM | POA: Diagnosis not present

## 2017-11-18 DIAGNOSIS — I712 Thoracic aortic aneurysm, without rupture: Secondary | ICD-10-CM | POA: Diagnosis present

## 2017-11-18 DIAGNOSIS — I252 Old myocardial infarction: Secondary | ICD-10-CM | POA: Diagnosis not present

## 2017-11-18 DIAGNOSIS — Z9181 History of falling: Secondary | ICD-10-CM | POA: Diagnosis not present

## 2017-11-18 DIAGNOSIS — I10 Essential (primary) hypertension: Secondary | ICD-10-CM | POA: Diagnosis not present

## 2017-11-18 DIAGNOSIS — I255 Ischemic cardiomyopathy: Secondary | ICD-10-CM | POA: Diagnosis present

## 2017-11-18 DIAGNOSIS — R0602 Shortness of breath: Secondary | ICD-10-CM | POA: Diagnosis not present

## 2017-11-18 DIAGNOSIS — Z951 Presence of aortocoronary bypass graft: Secondary | ICD-10-CM | POA: Diagnosis not present

## 2017-11-18 DIAGNOSIS — Z86718 Personal history of other venous thrombosis and embolism: Secondary | ICD-10-CM | POA: Diagnosis not present

## 2017-11-18 DIAGNOSIS — I4510 Unspecified right bundle-branch block: Secondary | ICD-10-CM | POA: Diagnosis present

## 2017-11-18 DIAGNOSIS — E785 Hyperlipidemia, unspecified: Secondary | ICD-10-CM | POA: Diagnosis not present

## 2017-11-18 DIAGNOSIS — Z950 Presence of cardiac pacemaker: Secondary | ICD-10-CM | POA: Diagnosis not present

## 2017-11-18 DIAGNOSIS — I509 Heart failure, unspecified: Secondary | ICD-10-CM | POA: Diagnosis not present

## 2017-11-18 DIAGNOSIS — Z7982 Long term (current) use of aspirin: Secondary | ICD-10-CM | POA: Diagnosis not present

## 2017-11-18 DIAGNOSIS — Z8546 Personal history of malignant neoplasm of prostate: Secondary | ICD-10-CM | POA: Diagnosis not present

## 2017-11-18 NOTE — Telephone Encounter (Signed)
Spoke with granddaughter.  Pt saw PCP yesterday.  After lab work results received Pt sent to ER today for possible blood clot.  Pt pending lung CT.  Will call back next week to follow up.

## 2017-11-19 DIAGNOSIS — I509 Heart failure, unspecified: Secondary | ICD-10-CM

## 2017-11-22 DIAGNOSIS — I2510 Atherosclerotic heart disease of native coronary artery without angina pectoris: Secondary | ICD-10-CM | POA: Diagnosis not present

## 2017-11-22 DIAGNOSIS — I252 Old myocardial infarction: Secondary | ICD-10-CM | POA: Diagnosis not present

## 2017-11-22 DIAGNOSIS — R54 Age-related physical debility: Secondary | ICD-10-CM | POA: Insufficient documentation

## 2017-11-22 DIAGNOSIS — E782 Mixed hyperlipidemia: Secondary | ICD-10-CM | POA: Diagnosis not present

## 2017-11-22 DIAGNOSIS — Z951 Presence of aortocoronary bypass graft: Secondary | ICD-10-CM | POA: Diagnosis not present

## 2017-11-22 DIAGNOSIS — Z86718 Personal history of other venous thrombosis and embolism: Secondary | ICD-10-CM | POA: Diagnosis not present

## 2017-11-22 DIAGNOSIS — Z9181 History of falling: Secondary | ICD-10-CM | POA: Diagnosis not present

## 2017-11-22 DIAGNOSIS — R002 Palpitations: Secondary | ICD-10-CM | POA: Insufficient documentation

## 2017-11-22 DIAGNOSIS — Z8709 Personal history of other diseases of the respiratory system: Secondary | ICD-10-CM | POA: Diagnosis not present

## 2017-11-22 DIAGNOSIS — Z8639 Personal history of other endocrine, nutritional and metabolic disease: Secondary | ICD-10-CM | POA: Diagnosis not present

## 2017-11-22 DIAGNOSIS — Z8546 Personal history of malignant neoplasm of prostate: Secondary | ICD-10-CM | POA: Diagnosis not present

## 2017-11-22 DIAGNOSIS — I440 Atrioventricular block, first degree: Secondary | ICD-10-CM | POA: Diagnosis not present

## 2017-11-22 DIAGNOSIS — I255 Ischemic cardiomyopathy: Secondary | ICD-10-CM | POA: Diagnosis not present

## 2017-11-22 DIAGNOSIS — Z8679 Personal history of other diseases of the circulatory system: Secondary | ICD-10-CM | POA: Diagnosis not present

## 2017-11-22 DIAGNOSIS — I509 Heart failure, unspecified: Secondary | ICD-10-CM | POA: Diagnosis not present

## 2017-11-22 DIAGNOSIS — I420 Dilated cardiomyopathy: Secondary | ICD-10-CM | POA: Diagnosis not present

## 2017-11-22 DIAGNOSIS — Z950 Presence of cardiac pacemaker: Secondary | ICD-10-CM | POA: Diagnosis not present

## 2017-11-22 NOTE — Telephone Encounter (Signed)
Pt hospitalized at [facility] 11/18/2017 to 11/20/2017 after being referred to PCP for recent fall/edema. Pt with f/u appt with Dr. Beatrix Fetters today 11/22/2017.   No further action needed at this time.

## 2017-11-23 DIAGNOSIS — I509 Heart failure, unspecified: Secondary | ICD-10-CM | POA: Diagnosis not present

## 2017-11-23 DIAGNOSIS — I2510 Atherosclerotic heart disease of native coronary artery without angina pectoris: Secondary | ICD-10-CM | POA: Diagnosis not present

## 2017-11-23 DIAGNOSIS — Z8709 Personal history of other diseases of the respiratory system: Secondary | ICD-10-CM | POA: Diagnosis not present

## 2017-11-23 DIAGNOSIS — I255 Ischemic cardiomyopathy: Secondary | ICD-10-CM | POA: Diagnosis not present

## 2017-11-23 DIAGNOSIS — Z8639 Personal history of other endocrine, nutritional and metabolic disease: Secondary | ICD-10-CM | POA: Diagnosis not present

## 2017-11-23 DIAGNOSIS — Z86718 Personal history of other venous thrombosis and embolism: Secondary | ICD-10-CM | POA: Diagnosis not present

## 2017-11-24 DIAGNOSIS — Z8709 Personal history of other diseases of the respiratory system: Secondary | ICD-10-CM | POA: Diagnosis not present

## 2017-11-24 DIAGNOSIS — I255 Ischemic cardiomyopathy: Secondary | ICD-10-CM | POA: Diagnosis not present

## 2017-11-24 DIAGNOSIS — I2510 Atherosclerotic heart disease of native coronary artery without angina pectoris: Secondary | ICD-10-CM | POA: Diagnosis not present

## 2017-11-24 DIAGNOSIS — Z8639 Personal history of other endocrine, nutritional and metabolic disease: Secondary | ICD-10-CM | POA: Diagnosis not present

## 2017-11-24 DIAGNOSIS — Z86718 Personal history of other venous thrombosis and embolism: Secondary | ICD-10-CM | POA: Diagnosis not present

## 2017-11-24 DIAGNOSIS — I509 Heart failure, unspecified: Secondary | ICD-10-CM | POA: Diagnosis not present

## 2017-11-28 DIAGNOSIS — I509 Heart failure, unspecified: Secondary | ICD-10-CM | POA: Diagnosis not present

## 2017-11-28 DIAGNOSIS — Z86718 Personal history of other venous thrombosis and embolism: Secondary | ICD-10-CM | POA: Diagnosis not present

## 2017-11-28 DIAGNOSIS — Z8639 Personal history of other endocrine, nutritional and metabolic disease: Secondary | ICD-10-CM | POA: Diagnosis not present

## 2017-11-28 DIAGNOSIS — Z8709 Personal history of other diseases of the respiratory system: Secondary | ICD-10-CM | POA: Diagnosis not present

## 2017-11-28 DIAGNOSIS — I255 Ischemic cardiomyopathy: Secondary | ICD-10-CM | POA: Diagnosis not present

## 2017-11-28 DIAGNOSIS — I2510 Atherosclerotic heart disease of native coronary artery without angina pectoris: Secondary | ICD-10-CM | POA: Diagnosis not present

## 2017-11-30 DIAGNOSIS — Z86718 Personal history of other venous thrombosis and embolism: Secondary | ICD-10-CM | POA: Diagnosis not present

## 2017-11-30 DIAGNOSIS — Z8709 Personal history of other diseases of the respiratory system: Secondary | ICD-10-CM | POA: Diagnosis not present

## 2017-11-30 DIAGNOSIS — I2510 Atherosclerotic heart disease of native coronary artery without angina pectoris: Secondary | ICD-10-CM | POA: Diagnosis not present

## 2017-11-30 DIAGNOSIS — I255 Ischemic cardiomyopathy: Secondary | ICD-10-CM | POA: Diagnosis not present

## 2017-11-30 DIAGNOSIS — I509 Heart failure, unspecified: Secondary | ICD-10-CM | POA: Diagnosis not present

## 2017-11-30 DIAGNOSIS — Z8639 Personal history of other endocrine, nutritional and metabolic disease: Secondary | ICD-10-CM | POA: Diagnosis not present

## 2017-12-01 ENCOUNTER — Ambulatory Visit (INDEPENDENT_AMBULATORY_CARE_PROVIDER_SITE_OTHER): Payer: Medicare Other | Admitting: Sports Medicine

## 2017-12-01 ENCOUNTER — Encounter: Payer: Self-pay | Admitting: Sports Medicine

## 2017-12-01 VITALS — BP 110/67 | HR 65 | Temp 97.9°F | Resp 16

## 2017-12-01 DIAGNOSIS — I739 Peripheral vascular disease, unspecified: Secondary | ICD-10-CM

## 2017-12-01 DIAGNOSIS — M79674 Pain in right toe(s): Secondary | ICD-10-CM | POA: Diagnosis not present

## 2017-12-01 DIAGNOSIS — M79675 Pain in left toe(s): Secondary | ICD-10-CM | POA: Diagnosis not present

## 2017-12-01 DIAGNOSIS — B351 Tinea unguium: Secondary | ICD-10-CM

## 2017-12-01 DIAGNOSIS — R0989 Other specified symptoms and signs involving the circulatory and respiratory systems: Secondary | ICD-10-CM

## 2017-12-01 DIAGNOSIS — L97511 Non-pressure chronic ulcer of other part of right foot limited to breakdown of skin: Secondary | ICD-10-CM

## 2017-12-01 DIAGNOSIS — L84 Corns and callosities: Secondary | ICD-10-CM

## 2017-12-01 MED ORDER — MEDIHONEY WOUND/BURN DRESSING EX GEL: CUTANEOUS | 1 refills | Status: AC

## 2017-12-01 NOTE — Progress Notes (Signed)
Subjective: Anthony Osborn is a 84 y.o. male patient seen today in office with complaint of painful thickened and elongated toenails and callus; unable to trim.  Reports that the callus on the right has been very sore and has drained and he applied Betadine to the area.  Patient denies nausea, vomiting, fever, chills or any other constitutional symptoms at this time.  Patient denies any local warmth redness or swelling coming from the right foot.  Patient has no other pedal complaints at this time.   Patient Active Problem List   Diagnosis Date Noted  . Cellulitis 05/16/2016  . Cellulitis of left lower extremity 05/13/2016  . Leg edema, left 05/13/2016  . Aftercare following surgery 04/27/2016  . Ingrown nail 04/20/2016  . Subungual hematoma of great toe of right foot 04/20/2016  . Ulcer of right foot, limited to breakdown of skin ([place]) 03/11/2016  . Cardiomyopathy, ischemic-EF 25-30% 01/24/2016  . Pacemaker (St Jude) implanted 01/23/16 01/24/2016  . Syncope 01/22/2016  . Third degree AV block ([place]) 01/22/2016  . Onychomycosis due to dermatophyte 01/20/2016  . Status post revision of total replacement of right knee 05/12/2015  . Pseudomonas infection   . DVT (deep venous thrombosis) ([place]) 04/27/2015  . Pyogenic arthritis of right knee joint ([place])   . Essential hypertension   . Encounter for palliative care   . Hx of CABG 2012 04/21/2015  . Septic arthritis of knee, right ([place]) 04/21/2015  . Cough 04/21/2015  . Hyperlipidemia   . Prostate cancer ([place])   . Hyperthyroidism     Current Outpatient Medications on File Prior to Visit  Medication Sig Dispense Refill  . acetaminophen (TYLENOL) 325 MG tablet Take 2 tablets (650 mg total) by mouth every 6 (six) hours as needed for mild pain (or Fever >/= 101).    Marland Kitchen aspirin EC 81 MG tablet Take 81 mg by mouth every evening.     Marland Kitchen CALCIUM-MAGNESIUM-ZINC PO Take 1 tablet by mouth 2 (two) times daily. Reported on 12/15/2015    . carvedilol (COREG)  6.25 MG tablet Take 1 tablet (6.25 mg total) by mouth 2 (two) times daily. 180 tablet 3  . Cholecalciferol (VITAMIN D-3 PO) Take 1,000 Units by mouth daily.      . furosemide (LASIX) 40 MG tablet TAKE ONE TABLET BY MOUTH ONCE DAILY 30 tablet 10  . lisinopril (PRINIVIL,ZESTRIL) 5 MG tablet Take 1 tablet (5 mg total) by mouth daily. Please make overdue yearly appt with Dr. Angelena Form before anymore refills. 1st attempt 90 tablet 3  . loratadine (CLARITIN) 10 MG tablet Take 10 mg by mouth at bedtime.    . Multiple Vitamins-Minerals (ICAPS) CAPS Take 2 capsules by mouth daily.    . naphazoline-pheniramine (NAPHCON-A) 0.025-0.3 % ophthalmic solution Place 1 drop into both eyes at bedtime.    . simvastatin (ZOCOR) 10 MG tablet Take 5 mg by mouth at bedtime.      No current facility-administered medications on file prior to visit.     No Known Allergies  Objective: Physical Exam  General: Well developed, nourished, no acute distress, awake, alert and oriented x 3  Vascular: Dorsalis pedis artery, Very faint 1/4 bilateral, Posterior tibial artery , very faint 1/4 bilateral, skin temperature warm to warm proximal to distal bilateral lower extremities, + varicosities, no pedal hair present bilateral. +1 pitting edema bilateral.   Neurological: Gross sensation present via light touch bilateral. Protective and vibratory diminished bilateral.   Dermatological: Skin is warm, dry, and supple bilateral, Left>Right  hallux nails are tender, long, thick, and discolored with moderate subungal debris, no webspace macerations present bilateral, mild reactive calluses pre-ulcerative in nature, sub-met 1 and 5 bilateral.  Once the right sub-met 5 callus was debrided there was an opening with a granular base noted measuring 0.5 x 0.5 cm with no fluctuance, no active drainage, no malodor no focal edema or erythema.  Musculoskeletal: Asymptomatic fat pad atrophy and hammertoe boney deformities noted bilateral. Muscular  strength within normal limits without painon range of motion. No pain with calf compression bilateral.  Assessment and Plan:  Problem List Items Addressed This Visit    None    Visit Diagnoses    Pain due to onychomycosis of toenails of both feet    -  Primary   Right foot ulcer, limited to breakdown of skin ([facility])       Relevant Medications   Wound Dressings (MEDIHONEY WOUND/BURN DRESSING) GEL   Pre-ulcerative calluses       Diminished pulses in lower extremity       PVD (peripheral vascular disease) ([place])         -Examined patient.  -Discussed treatment options for painful mycotic nails and pre-ulcerative calluses. -Mechanically debrided pre-ulcerative callus x3 using sterile chisel blade and reduced mycotic nails with sterile nail nipper and dremel nail file without incident. -Discussed treatment options for ulcerated callus right sub-met 5 -Mechanically debrided the wound using a sterile chisel blade removing all nonviable tissue hemostasis was achieved with manual pressure at the level of the dermis and wound measurements as above.  Patient tolerated procedure well without need for anesthesia. -Applied meta honey and ordered patient to do the same at home daily until area is healed.  Applied offloading padding to insoles.  Advised patient to watch closely for any signs of infection if occurs to return to office or ER immediately. -Will continue to closely monitor feet since TBI is diminished and will consult vascular if acute changes are noted; patient is currently under hospice care -Patient to return after 1 month for wound check or sooner if symptoms worsen.  Landis Martins, DPM

## 2017-12-05 DIAGNOSIS — Z950 Presence of cardiac pacemaker: Secondary | ICD-10-CM | POA: Diagnosis not present

## 2017-12-05 DIAGNOSIS — I255 Ischemic cardiomyopathy: Secondary | ICD-10-CM | POA: Diagnosis not present

## 2017-12-05 DIAGNOSIS — Z8639 Personal history of other endocrine, nutritional and metabolic disease: Secondary | ICD-10-CM | POA: Diagnosis not present

## 2017-12-05 DIAGNOSIS — I2510 Atherosclerotic heart disease of native coronary artery without angina pectoris: Secondary | ICD-10-CM | POA: Diagnosis not present

## 2017-12-05 DIAGNOSIS — Z8546 Personal history of malignant neoplasm of prostate: Secondary | ICD-10-CM | POA: Diagnosis not present

## 2017-12-05 DIAGNOSIS — Z86718 Personal history of other venous thrombosis and embolism: Secondary | ICD-10-CM | POA: Diagnosis not present

## 2017-12-05 DIAGNOSIS — Z8709 Personal history of other diseases of the respiratory system: Secondary | ICD-10-CM | POA: Diagnosis not present

## 2017-12-05 DIAGNOSIS — Z951 Presence of aortocoronary bypass graft: Secondary | ICD-10-CM | POA: Diagnosis not present

## 2017-12-05 DIAGNOSIS — Z9181 History of falling: Secondary | ICD-10-CM | POA: Diagnosis not present

## 2017-12-05 DIAGNOSIS — Z8679 Personal history of other diseases of the circulatory system: Secondary | ICD-10-CM | POA: Diagnosis not present

## 2017-12-05 DIAGNOSIS — I509 Heart failure, unspecified: Secondary | ICD-10-CM | POA: Diagnosis not present

## 2017-12-05 DIAGNOSIS — I252 Old myocardial infarction: Secondary | ICD-10-CM | POA: Diagnosis not present

## 2017-12-07 DIAGNOSIS — C44629 Squamous cell carcinoma of skin of left upper limb, including shoulder: Secondary | ICD-10-CM | POA: Diagnosis not present

## 2017-12-12 DIAGNOSIS — I255 Ischemic cardiomyopathy: Secondary | ICD-10-CM | POA: Diagnosis not present

## 2017-12-12 DIAGNOSIS — Z86718 Personal history of other venous thrombosis and embolism: Secondary | ICD-10-CM | POA: Diagnosis not present

## 2017-12-12 DIAGNOSIS — I509 Heart failure, unspecified: Secondary | ICD-10-CM | POA: Diagnosis not present

## 2017-12-12 DIAGNOSIS — Z8639 Personal history of other endocrine, nutritional and metabolic disease: Secondary | ICD-10-CM | POA: Diagnosis not present

## 2017-12-12 DIAGNOSIS — Z8709 Personal history of other diseases of the respiratory system: Secondary | ICD-10-CM | POA: Diagnosis not present

## 2017-12-12 DIAGNOSIS — I2510 Atherosclerotic heart disease of native coronary artery without angina pectoris: Secondary | ICD-10-CM | POA: Diagnosis not present

## 2017-12-13 IMAGING — NM NM BONE WHOLE BODY
2 series · 2 of 2 positions shown · non-contrast
Comparison: Nuchal medicine bone scan of 03/25/2009

CLINICAL DATA: History of prostate carcinoma with elevated PSA low
back pain

EXAM:
NUCLEAR MEDICINE WHOLE BODY BONE SCAN
TECHNIQUE: Whole body anterior and posterior images were obtained approximately
3 hours after intravenous injection of radiopharmaceutical.
RADIOPHARMACEUTICALS:  21.4 mCi 6echnetium-MMm MDP IV

[Series 1: whole body · 2.66mm/px · 1 of 1 slices shown (1 of 2)]
[im 1/1]
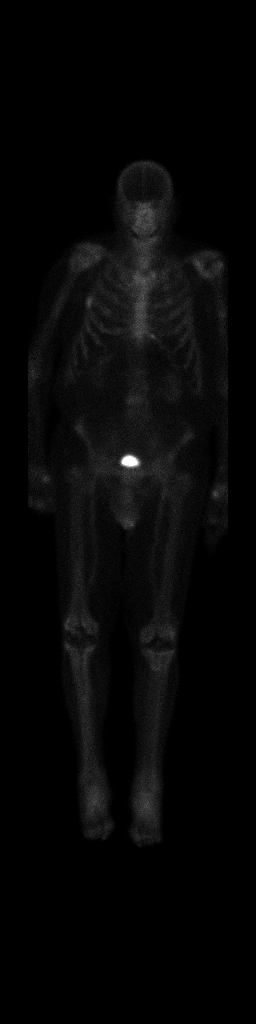

[Series 1: whole body · 2.66mm/px · 1 of 1 slices shown (2 of 2)]
[im 1/1]
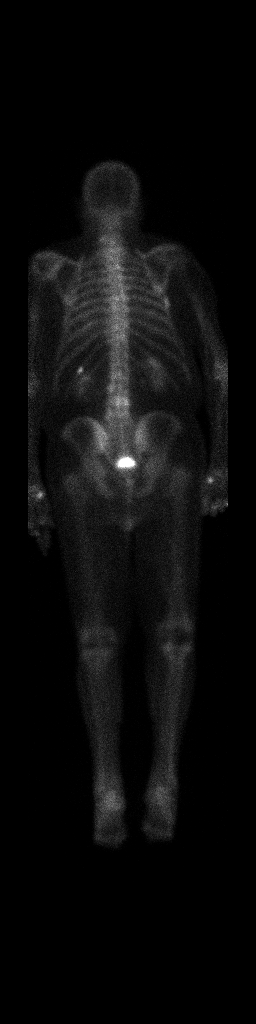

[2 of 2 positions shown; findings below may reference images not displayed]

FINDINGS: Several foci of increased activity are worrisome for bone
metastasis. One is in the mid proximal left humerus with a second
within the lateral right fourth or fifth rib. There is also a focus
of activity within the posterior left eleventh rib suspicious for
metastatic lesion. The activity throughout the thoracolumbar spine
is mottled and metastatic lesions in the thoracic and lumbar spine
cannot be excluded. Degenerative changes are again noted at the
first carpal -metacarpal articulations bilaterally. Photopenic areas
are present in the knees from bilateral knee replacements with some
increased activity in the ankle joints secondary to degenerative
change.
IMPRESSION: Several small foci of increased activity which appear new compared
to the prior bone scan and which are worrisome for metastatic
lesions as described above.

## 2017-12-15 DIAGNOSIS — I509 Heart failure, unspecified: Secondary | ICD-10-CM | POA: Diagnosis not present

## 2017-12-15 DIAGNOSIS — Z86718 Personal history of other venous thrombosis and embolism: Secondary | ICD-10-CM | POA: Diagnosis not present

## 2017-12-15 DIAGNOSIS — I2510 Atherosclerotic heart disease of native coronary artery without angina pectoris: Secondary | ICD-10-CM | POA: Diagnosis not present

## 2017-12-15 DIAGNOSIS — Z8639 Personal history of other endocrine, nutritional and metabolic disease: Secondary | ICD-10-CM | POA: Diagnosis not present

## 2017-12-15 DIAGNOSIS — I255 Ischemic cardiomyopathy: Secondary | ICD-10-CM | POA: Diagnosis not present

## 2017-12-15 DIAGNOSIS — Z8709 Personal history of other diseases of the respiratory system: Secondary | ICD-10-CM | POA: Diagnosis not present

## 2017-12-16 ENCOUNTER — Ambulatory Visit: Payer: Medicare Other | Admitting: Physician Assistant

## 2017-12-16 DIAGNOSIS — I2510 Atherosclerotic heart disease of native coronary artery without angina pectoris: Secondary | ICD-10-CM | POA: Diagnosis not present

## 2017-12-16 DIAGNOSIS — C44629 Squamous cell carcinoma of skin of left upper limb, including shoulder: Secondary | ICD-10-CM | POA: Diagnosis not present

## 2017-12-16 DIAGNOSIS — I509 Heart failure, unspecified: Secondary | ICD-10-CM | POA: Diagnosis not present

## 2017-12-16 DIAGNOSIS — Z8709 Personal history of other diseases of the respiratory system: Secondary | ICD-10-CM | POA: Diagnosis not present

## 2017-12-16 DIAGNOSIS — I255 Ischemic cardiomyopathy: Secondary | ICD-10-CM | POA: Diagnosis not present

## 2017-12-16 DIAGNOSIS — Z86718 Personal history of other venous thrombosis and embolism: Secondary | ICD-10-CM | POA: Diagnosis not present

## 2017-12-16 DIAGNOSIS — Z8639 Personal history of other endocrine, nutritional and metabolic disease: Secondary | ICD-10-CM | POA: Diagnosis not present

## 2017-12-19 DIAGNOSIS — Z86718 Personal history of other venous thrombosis and embolism: Secondary | ICD-10-CM | POA: Diagnosis not present

## 2017-12-19 DIAGNOSIS — Z8709 Personal history of other diseases of the respiratory system: Secondary | ICD-10-CM | POA: Diagnosis not present

## 2017-12-19 DIAGNOSIS — Z8639 Personal history of other endocrine, nutritional and metabolic disease: Secondary | ICD-10-CM | POA: Diagnosis not present

## 2017-12-19 DIAGNOSIS — I2510 Atherosclerotic heart disease of native coronary artery without angina pectoris: Secondary | ICD-10-CM | POA: Diagnosis not present

## 2017-12-19 DIAGNOSIS — I509 Heart failure, unspecified: Secondary | ICD-10-CM | POA: Diagnosis not present

## 2017-12-19 DIAGNOSIS — I255 Ischemic cardiomyopathy: Secondary | ICD-10-CM | POA: Diagnosis not present

## 2017-12-21 DIAGNOSIS — I509 Heart failure, unspecified: Secondary | ICD-10-CM | POA: Diagnosis not present

## 2017-12-21 DIAGNOSIS — I255 Ischemic cardiomyopathy: Secondary | ICD-10-CM | POA: Diagnosis not present

## 2017-12-21 DIAGNOSIS — Z86718 Personal history of other venous thrombosis and embolism: Secondary | ICD-10-CM | POA: Diagnosis not present

## 2017-12-21 DIAGNOSIS — Z8709 Personal history of other diseases of the respiratory system: Secondary | ICD-10-CM | POA: Diagnosis not present

## 2017-12-21 DIAGNOSIS — Z8639 Personal history of other endocrine, nutritional and metabolic disease: Secondary | ICD-10-CM | POA: Diagnosis not present

## 2017-12-21 DIAGNOSIS — I2510 Atherosclerotic heart disease of native coronary artery without angina pectoris: Secondary | ICD-10-CM | POA: Diagnosis not present

## 2017-12-22 DIAGNOSIS — I509 Heart failure, unspecified: Secondary | ICD-10-CM | POA: Diagnosis not present

## 2017-12-22 DIAGNOSIS — Z86718 Personal history of other venous thrombosis and embolism: Secondary | ICD-10-CM | POA: Diagnosis not present

## 2017-12-22 DIAGNOSIS — Z8639 Personal history of other endocrine, nutritional and metabolic disease: Secondary | ICD-10-CM | POA: Diagnosis not present

## 2017-12-22 DIAGNOSIS — Z8709 Personal history of other diseases of the respiratory system: Secondary | ICD-10-CM | POA: Diagnosis not present

## 2017-12-22 DIAGNOSIS — I2510 Atherosclerotic heart disease of native coronary artery without angina pectoris: Secondary | ICD-10-CM | POA: Diagnosis not present

## 2017-12-22 DIAGNOSIS — I255 Ischemic cardiomyopathy: Secondary | ICD-10-CM | POA: Diagnosis not present

## 2017-12-23 DIAGNOSIS — Z8639 Personal history of other endocrine, nutritional and metabolic disease: Secondary | ICD-10-CM | POA: Diagnosis not present

## 2017-12-23 DIAGNOSIS — Z8709 Personal history of other diseases of the respiratory system: Secondary | ICD-10-CM | POA: Diagnosis not present

## 2017-12-23 DIAGNOSIS — I509 Heart failure, unspecified: Secondary | ICD-10-CM | POA: Diagnosis not present

## 2017-12-23 DIAGNOSIS — I2510 Atherosclerotic heart disease of native coronary artery without angina pectoris: Secondary | ICD-10-CM | POA: Diagnosis not present

## 2017-12-23 DIAGNOSIS — Z86718 Personal history of other venous thrombosis and embolism: Secondary | ICD-10-CM | POA: Diagnosis not present

## 2017-12-23 DIAGNOSIS — I255 Ischemic cardiomyopathy: Secondary | ICD-10-CM | POA: Diagnosis not present

## 2017-12-26 DIAGNOSIS — I509 Heart failure, unspecified: Secondary | ICD-10-CM | POA: Diagnosis not present

## 2017-12-26 DIAGNOSIS — I2510 Atherosclerotic heart disease of native coronary artery without angina pectoris: Secondary | ICD-10-CM | POA: Diagnosis not present

## 2017-12-26 DIAGNOSIS — Z86718 Personal history of other venous thrombosis and embolism: Secondary | ICD-10-CM | POA: Diagnosis not present

## 2017-12-26 DIAGNOSIS — Z8709 Personal history of other diseases of the respiratory system: Secondary | ICD-10-CM | POA: Diagnosis not present

## 2017-12-26 DIAGNOSIS — I255 Ischemic cardiomyopathy: Secondary | ICD-10-CM | POA: Diagnosis not present

## 2017-12-26 DIAGNOSIS — Z8639 Personal history of other endocrine, nutritional and metabolic disease: Secondary | ICD-10-CM | POA: Diagnosis not present

## 2017-12-27 DIAGNOSIS — Z8709 Personal history of other diseases of the respiratory system: Secondary | ICD-10-CM | POA: Diagnosis not present

## 2017-12-27 DIAGNOSIS — I255 Ischemic cardiomyopathy: Secondary | ICD-10-CM | POA: Diagnosis not present

## 2017-12-27 DIAGNOSIS — Z8639 Personal history of other endocrine, nutritional and metabolic disease: Secondary | ICD-10-CM | POA: Diagnosis not present

## 2017-12-27 DIAGNOSIS — Z86718 Personal history of other venous thrombosis and embolism: Secondary | ICD-10-CM | POA: Diagnosis not present

## 2017-12-27 DIAGNOSIS — I2510 Atherosclerotic heart disease of native coronary artery without angina pectoris: Secondary | ICD-10-CM | POA: Diagnosis not present

## 2017-12-27 DIAGNOSIS — I509 Heart failure, unspecified: Secondary | ICD-10-CM | POA: Diagnosis not present

## 2017-12-29 ENCOUNTER — Ambulatory Visit (INDEPENDENT_AMBULATORY_CARE_PROVIDER_SITE_OTHER): Payer: Medicare Other | Admitting: Sports Medicine

## 2017-12-29 ENCOUNTER — Encounter: Payer: Self-pay | Admitting: Sports Medicine

## 2017-12-29 ENCOUNTER — Telehealth: Payer: Self-pay | Admitting: Cardiology

## 2017-12-29 DIAGNOSIS — L97511 Non-pressure chronic ulcer of other part of right foot limited to breakdown of skin: Secondary | ICD-10-CM | POA: Diagnosis not present

## 2017-12-29 DIAGNOSIS — I255 Ischemic cardiomyopathy: Secondary | ICD-10-CM | POA: Diagnosis not present

## 2017-12-29 DIAGNOSIS — I739 Peripheral vascular disease, unspecified: Secondary | ICD-10-CM | POA: Diagnosis not present

## 2017-12-29 DIAGNOSIS — Z8709 Personal history of other diseases of the respiratory system: Secondary | ICD-10-CM | POA: Diagnosis not present

## 2017-12-29 DIAGNOSIS — Z86718 Personal history of other venous thrombosis and embolism: Secondary | ICD-10-CM | POA: Diagnosis not present

## 2017-12-29 DIAGNOSIS — R0989 Other specified symptoms and signs involving the circulatory and respiratory systems: Secondary | ICD-10-CM

## 2017-12-29 DIAGNOSIS — I509 Heart failure, unspecified: Secondary | ICD-10-CM | POA: Diagnosis not present

## 2017-12-29 DIAGNOSIS — I2510 Atherosclerotic heart disease of native coronary artery without angina pectoris: Secondary | ICD-10-CM | POA: Diagnosis not present

## 2017-12-29 DIAGNOSIS — Z8639 Personal history of other endocrine, nutritional and metabolic disease: Secondary | ICD-10-CM | POA: Diagnosis not present

## 2017-12-29 DIAGNOSIS — L84 Corns and callosities: Secondary | ICD-10-CM

## 2017-12-29 NOTE — Progress Notes (Signed)
Subjective: Anthony Osborn is a 84 y.o. male patient seen today in office for follow-up evaluation on pre-ulcerative calluses and right foot ulceration.  Patient has been applying medihoney to the area and dry dressing as instructed.  Patient denies nausea, vomiting, fever, chills or any other constitutional symptoms at this time.  Patient denies any local warmth redness or swelling coming from the right foot.  Reports pain at the right foot plantar fifth metatarsal head 9 out of 10 with walking or standing with use of walker.  Patient has no other pedal complaints at this time.   Patient is still under hospice care.  Patient Active Problem List   Diagnosis Date Noted  . Cellulitis 05/16/2016  . Cellulitis of left lower extremity 05/13/2016  . Leg edema, left 05/13/2016  . Aftercare following surgery 04/27/2016  . Ingrown nail 04/20/2016  . Subungual hematoma of great toe of right foot 04/20/2016  . Ulcer of right foot, limited to breakdown of skin ([place]) 03/11/2016  . Cardiomyopathy, ischemic-EF 25-30% 01/24/2016  . Pacemaker (St Jude) implanted 01/23/16 01/24/2016  . Syncope 01/22/2016  . Third degree AV block ([place]) 01/22/2016  . Onychomycosis due to dermatophyte 01/20/2016  . Status post revision of total replacement of right knee 05/12/2015  . Pseudomonas infection   . DVT (deep venous thrombosis) ([place]) 04/27/2015  . Pyogenic arthritis of right knee joint ([place])   . Essential hypertension   . Encounter for palliative care   . Hx of CABG 2012 04/21/2015  . Septic arthritis of knee, right ([place]) 04/21/2015  . Cough 04/21/2015  . Hyperlipidemia   . Prostate cancer ([place])   . Hyperthyroidism     Current Outpatient Medications on File Prior to Visit  Medication Sig Dispense Refill  . acetaminophen (TYLENOL) 325 MG tablet Take 2 tablets (650 mg total) by mouth every 6 (six) hours as needed for mild pain (or Fever >/= 101).    Marland Kitchen aspirin EC 81 MG tablet Take 81 mg by mouth every evening.      Marland Kitchen CALCIUM-MAGNESIUM-ZINC PO Take 1 tablet by mouth 2 (two) times daily. Reported on 12/15/2015    . carvedilol (COREG) 6.25 MG tablet Take 1 tablet (6.25 mg total) by mouth 2 (two) times daily. 180 tablet 3  . Cholecalciferol (VITAMIN D-3 PO) Take 1,000 Units by mouth daily.      . furosemide (LASIX) 40 MG tablet TAKE ONE TABLET BY MOUTH ONCE DAILY 30 tablet 10  . lisinopril (PRINIVIL,ZESTRIL) 5 MG tablet Take 1 tablet (5 mg total) by mouth daily. Please make overdue yearly appt with Dr. Angelena Form before anymore refills. 1st attempt 90 tablet 3  . loratadine (CLARITIN) 10 MG tablet Take 10 mg by mouth at bedtime.    . Multiple Vitamins-Minerals (ICAPS) CAPS Take 2 capsules by mouth daily.    . naphazoline-pheniramine (NAPHCON-A) 0.025-0.3 % ophthalmic solution Place 1 drop into both eyes at bedtime.    . simvastatin (ZOCOR) 10 MG tablet Take 5 mg by mouth at bedtime.     . Wound Dressings (MEDIHONEY WOUND/BURN DRESSING) GEL Apply a small amount daily to foot wounds 44 mL 1   No current facility-administered medications on file prior to visit.     No Known Allergies  Objective: Physical Exam  General: Well developed, nourished, no acute distress, awake, alert and oriented x 3  Vascular: Dorsalis pedis artery, Very faint 1/4 bilateral, Posterior tibial artery , very faint 1/4 bilateral, skin temperature warm to warm proximal to distal bilateral lower  extremities, + varicosities, no pedal hair present bilateral. +1 pitting edema bilateral.   Neurological: Gross sensation present via light touch bilateral. Protective and vibratory diminished bilateral.   Dermatological: Skin is warm, dry, and supple bilateral, Left>Right hallux nails are short thick, and discolored with moderate subungal debris, no webspace macerations present bilateral, mild reactive calluses pre-ulcerative in nature, sub-met 1 and 5 bilateral.  Right sub-met 5 callus continues to be ulcerated with a granular base noted  measuring 0.2 x 0.2 cm with no fluctuance, no active drainage, no malodor no focal edema or erythema.  Musculoskeletal: Asymptomatic fat pad atrophy and hammertoe boney deformities noted bilateral. Muscular strength within normal limits without painon range of motion. No pain with calf compression bilateral.  Assessment and Plan:  Problem List Items Addressed This Visit    None    Visit Diagnoses    Right foot ulcer, limited to breakdown of skin (HCC)    -  Primary   Pre-ulcerative calluses       Diminished pulses in lower extremity       PVD (peripheral vascular disease) ([place])         -Examined patient.  -Mechanically debrided pre-ulcerative callus x3 using sterile chisel blade and debrided the wound using a sterile chisel blade removing all nonviable tissue hemostasis was achieved with manual pressure at the level of the dermis and wound measurements as above.  Patient tolerated procedure well without need for anesthesia. -Applied medihoney and advised patient to do the same at home daily. Advised patient to watch closely for any signs of infection if occurs to return to office or ER immediately. -Patient to return in 3 to 4 weeks for wound check or sooner if symptoms worsen.  Landis Martins, DPM

## 2017-12-29 NOTE — Telephone Encounter (Signed)
LMOVM for pt to return call in regards to releasing his information to another clinic in remote monitoring web site.

## 2017-12-30 ENCOUNTER — Telehealth: Payer: Self-pay | Admitting: Cardiology

## 2017-12-30 DIAGNOSIS — I509 Heart failure, unspecified: Secondary | ICD-10-CM | POA: Diagnosis not present

## 2017-12-30 DIAGNOSIS — Z8639 Personal history of other endocrine, nutritional and metabolic disease: Secondary | ICD-10-CM | POA: Diagnosis not present

## 2017-12-30 DIAGNOSIS — Z8709 Personal history of other diseases of the respiratory system: Secondary | ICD-10-CM | POA: Diagnosis not present

## 2017-12-30 DIAGNOSIS — I2510 Atherosclerotic heart disease of native coronary artery without angina pectoris: Secondary | ICD-10-CM | POA: Diagnosis not present

## 2017-12-30 DIAGNOSIS — I255 Ischemic cardiomyopathy: Secondary | ICD-10-CM | POA: Diagnosis not present

## 2017-12-30 DIAGNOSIS — Z86718 Personal history of other venous thrombosis and embolism: Secondary | ICD-10-CM | POA: Diagnosis not present

## 2017-12-30 NOTE — Telephone Encounter (Signed)
No note needed 

## 2017-12-30 NOTE — Telephone Encounter (Signed)
2nd attempt  LMOVM for pt to return call.  

## 2017-12-30 NOTE — Telephone Encounter (Signed)
Patient granddaughter verbalized to release patient remote monitoring to [facility].

## 2018-01-02 DIAGNOSIS — I509 Heart failure, unspecified: Secondary | ICD-10-CM | POA: Diagnosis not present

## 2018-01-02 DIAGNOSIS — Z8709 Personal history of other diseases of the respiratory system: Secondary | ICD-10-CM | POA: Diagnosis not present

## 2018-01-02 DIAGNOSIS — Z8639 Personal history of other endocrine, nutritional and metabolic disease: Secondary | ICD-10-CM | POA: Diagnosis not present

## 2018-01-02 DIAGNOSIS — I2510 Atherosclerotic heart disease of native coronary artery without angina pectoris: Secondary | ICD-10-CM | POA: Diagnosis not present

## 2018-01-02 DIAGNOSIS — Z86718 Personal history of other venous thrombosis and embolism: Secondary | ICD-10-CM | POA: Diagnosis not present

## 2018-01-02 DIAGNOSIS — I255 Ischemic cardiomyopathy: Secondary | ICD-10-CM | POA: Diagnosis not present

## 2018-01-05 DIAGNOSIS — Z8546 Personal history of malignant neoplasm of prostate: Secondary | ICD-10-CM | POA: Diagnosis not present

## 2018-01-05 DIAGNOSIS — I255 Ischemic cardiomyopathy: Secondary | ICD-10-CM | POA: Diagnosis not present

## 2018-01-05 DIAGNOSIS — Z950 Presence of cardiac pacemaker: Secondary | ICD-10-CM | POA: Diagnosis not present

## 2018-01-05 DIAGNOSIS — Z86718 Personal history of other venous thrombosis and embolism: Secondary | ICD-10-CM | POA: Diagnosis not present

## 2018-01-05 DIAGNOSIS — Z8639 Personal history of other endocrine, nutritional and metabolic disease: Secondary | ICD-10-CM | POA: Diagnosis not present

## 2018-01-05 DIAGNOSIS — Z8679 Personal history of other diseases of the circulatory system: Secondary | ICD-10-CM | POA: Diagnosis not present

## 2018-01-05 DIAGNOSIS — Z9181 History of falling: Secondary | ICD-10-CM | POA: Diagnosis not present

## 2018-01-05 DIAGNOSIS — H353211 Exudative age-related macular degeneration, right eye, with active choroidal neovascularization: Secondary | ICD-10-CM | POA: Diagnosis not present

## 2018-01-05 DIAGNOSIS — Z8709 Personal history of other diseases of the respiratory system: Secondary | ICD-10-CM | POA: Diagnosis not present

## 2018-01-05 DIAGNOSIS — Z951 Presence of aortocoronary bypass graft: Secondary | ICD-10-CM | POA: Diagnosis not present

## 2018-01-05 DIAGNOSIS — I2510 Atherosclerotic heart disease of native coronary artery without angina pectoris: Secondary | ICD-10-CM | POA: Diagnosis not present

## 2018-01-05 DIAGNOSIS — I509 Heart failure, unspecified: Secondary | ICD-10-CM | POA: Diagnosis not present

## 2018-01-05 DIAGNOSIS — I252 Old myocardial infarction: Secondary | ICD-10-CM | POA: Diagnosis not present

## 2018-01-06 DIAGNOSIS — Z86718 Personal history of other venous thrombosis and embolism: Secondary | ICD-10-CM | POA: Diagnosis not present

## 2018-01-06 DIAGNOSIS — Z8709 Personal history of other diseases of the respiratory system: Secondary | ICD-10-CM | POA: Diagnosis not present

## 2018-01-06 DIAGNOSIS — I255 Ischemic cardiomyopathy: Secondary | ICD-10-CM | POA: Diagnosis not present

## 2018-01-06 DIAGNOSIS — I509 Heart failure, unspecified: Secondary | ICD-10-CM | POA: Diagnosis not present

## 2018-01-06 DIAGNOSIS — Z8639 Personal history of other endocrine, nutritional and metabolic disease: Secondary | ICD-10-CM | POA: Diagnosis not present

## 2018-01-06 DIAGNOSIS — I2510 Atherosclerotic heart disease of native coronary artery without angina pectoris: Secondary | ICD-10-CM | POA: Diagnosis not present

## 2018-01-09 DIAGNOSIS — Z86718 Personal history of other venous thrombosis and embolism: Secondary | ICD-10-CM | POA: Diagnosis not present

## 2018-01-09 DIAGNOSIS — I2510 Atherosclerotic heart disease of native coronary artery without angina pectoris: Secondary | ICD-10-CM | POA: Diagnosis not present

## 2018-01-09 DIAGNOSIS — Z8709 Personal history of other diseases of the respiratory system: Secondary | ICD-10-CM | POA: Diagnosis not present

## 2018-01-09 DIAGNOSIS — I509 Heart failure, unspecified: Secondary | ICD-10-CM | POA: Diagnosis not present

## 2018-01-09 DIAGNOSIS — Z8639 Personal history of other endocrine, nutritional and metabolic disease: Secondary | ICD-10-CM | POA: Diagnosis not present

## 2018-01-09 DIAGNOSIS — I255 Ischemic cardiomyopathy: Secondary | ICD-10-CM | POA: Diagnosis not present

## 2018-01-13 DIAGNOSIS — Z8639 Personal history of other endocrine, nutritional and metabolic disease: Secondary | ICD-10-CM | POA: Diagnosis not present

## 2018-01-13 DIAGNOSIS — Z86718 Personal history of other venous thrombosis and embolism: Secondary | ICD-10-CM | POA: Diagnosis not present

## 2018-01-13 DIAGNOSIS — I255 Ischemic cardiomyopathy: Secondary | ICD-10-CM | POA: Diagnosis not present

## 2018-01-13 DIAGNOSIS — Z8709 Personal history of other diseases of the respiratory system: Secondary | ICD-10-CM | POA: Diagnosis not present

## 2018-01-13 DIAGNOSIS — I2510 Atherosclerotic heart disease of native coronary artery without angina pectoris: Secondary | ICD-10-CM | POA: Diagnosis not present

## 2018-01-13 DIAGNOSIS — I509 Heart failure, unspecified: Secondary | ICD-10-CM | POA: Diagnosis not present

## 2018-01-16 DIAGNOSIS — I255 Ischemic cardiomyopathy: Secondary | ICD-10-CM | POA: Diagnosis not present

## 2018-01-16 DIAGNOSIS — Z8639 Personal history of other endocrine, nutritional and metabolic disease: Secondary | ICD-10-CM | POA: Diagnosis not present

## 2018-01-16 DIAGNOSIS — I2510 Atherosclerotic heart disease of native coronary artery without angina pectoris: Secondary | ICD-10-CM | POA: Diagnosis not present

## 2018-01-16 DIAGNOSIS — I509 Heart failure, unspecified: Secondary | ICD-10-CM | POA: Diagnosis not present

## 2018-01-16 DIAGNOSIS — Z8709 Personal history of other diseases of the respiratory system: Secondary | ICD-10-CM | POA: Diagnosis not present

## 2018-01-16 DIAGNOSIS — Z86718 Personal history of other venous thrombosis and embolism: Secondary | ICD-10-CM | POA: Diagnosis not present

## 2018-01-18 ENCOUNTER — Ambulatory Visit (INDEPENDENT_AMBULATORY_CARE_PROVIDER_SITE_OTHER): Payer: Medicare Other | Admitting: Sports Medicine

## 2018-01-18 ENCOUNTER — Encounter: Payer: Self-pay | Admitting: Sports Medicine

## 2018-01-18 DIAGNOSIS — R0989 Other specified symptoms and signs involving the circulatory and respiratory systems: Secondary | ICD-10-CM

## 2018-01-18 DIAGNOSIS — L97511 Non-pressure chronic ulcer of other part of right foot limited to breakdown of skin: Secondary | ICD-10-CM

## 2018-01-18 DIAGNOSIS — I739 Peripheral vascular disease, unspecified: Secondary | ICD-10-CM

## 2018-01-18 DIAGNOSIS — L97521 Non-pressure chronic ulcer of other part of left foot limited to breakdown of skin: Secondary | ICD-10-CM

## 2018-01-18 DIAGNOSIS — M79671 Pain in right foot: Secondary | ICD-10-CM

## 2018-01-18 DIAGNOSIS — M79675 Pain in left toe(s): Secondary | ICD-10-CM

## 2018-01-18 DIAGNOSIS — L84 Corns and callosities: Secondary | ICD-10-CM

## 2018-01-18 NOTE — Progress Notes (Signed)
Subjective: Anthony Osborn is a 84 y.o. male patient seen today in office for follow-up evaluation on pre-ulcerative calluses and right foot ulceration.  Patient has been applying medihoney to the area and dry dressing as instructed.  Patient denies nausea, vomiting, fever, chills or any other constitutional symptoms at this time.  Patient denies any local warmth redness or swelling coming from the right foot but does admit to a new wound at the distal tuft of the left great toe possibly caused from compression stockings.  Patient has no other pedal complaints at this time.   Patient is currently under hospice care.  Patient Active Problem List   Diagnosis Date Noted  . Cellulitis 05/16/2016  . Cellulitis of left lower extremity 05/13/2016  . Leg edema, left 05/13/2016  . Aftercare following surgery 04/27/2016  . Ingrown nail 04/20/2016  . Subungual hematoma of great toe of right foot 04/20/2016  . Ulcer of right foot, limited to breakdown of skin ([place]) 03/11/2016  . Cardiomyopathy, ischemic-EF 25-30% 01/24/2016  . Pacemaker (St Jude) implanted 01/23/16 01/24/2016  . Syncope 01/22/2016  . Third degree AV block ([place]) 01/22/2016  . Onychomycosis due to dermatophyte 01/20/2016  . Status post revision of total replacement of right knee 05/12/2015  . Pseudomonas infection   . DVT (deep venous thrombosis) ([place]) 04/27/2015  . Pyogenic arthritis of right knee joint ([place])   . Essential hypertension   . Encounter for palliative care   . Hx of CABG 2012 04/21/2015  . Septic arthritis of knee, right ([place]) 04/21/2015  . Cough 04/21/2015  . Hyperlipidemia   . Prostate cancer ([place])   . Hyperthyroidism     Current Outpatient Medications on File Prior to Visit  Medication Sig Dispense Refill  . acetaminophen (TYLENOL) 325 MG tablet Take 2 tablets (650 mg total) by mouth every 6 (six) hours as needed for mild pain (or Fever >/= 101).    Marland Kitchen aspirin EC 81 MG tablet Take 81 mg by mouth every evening.      Marland Kitchen CALCIUM-MAGNESIUM-ZINC PO Take 1 tablet by mouth 2 (two) times daily. Reported on 12/15/2015    . carvedilol (COREG) 6.25 MG tablet Take 1 tablet (6.25 mg total) by mouth 2 (two) times daily. 180 tablet 3  . Cholecalciferol (VITAMIN D-3 PO) Take 1,000 Units by mouth daily.      . furosemide (LASIX) 40 MG tablet TAKE ONE TABLET BY MOUTH ONCE DAILY 30 tablet 10  . lisinopril (PRINIVIL,ZESTRIL) 5 MG tablet Take 1 tablet (5 mg total) by mouth daily. Please make overdue yearly appt with Dr. Angelena Form before anymore refills. 1st attempt 90 tablet 3  . loratadine (CLARITIN) 10 MG tablet Take 10 mg by mouth at bedtime.    . Multiple Vitamins-Minerals (ICAPS) CAPS Take 2 capsules by mouth daily.    . naphazoline-pheniramine (NAPHCON-A) 0.025-0.3 % ophthalmic solution Place 1 drop into both eyes at bedtime.    . simvastatin (ZOCOR) 10 MG tablet Take 5 mg by mouth at bedtime.     . Wound Dressings (MEDIHONEY WOUND/BURN DRESSING) GEL Apply a small amount daily to foot wounds 44 mL 1   No current facility-administered medications on file prior to visit.     No Known Allergies  Objective: Physical Exam  General: Well developed, nourished, no acute distress, awake, alert and oriented x 3  Vascular: Dorsalis pedis artery, Very faint 1/4 bilateral, Posterior tibial artery , very faint 1/4 bilateral, skin temperature warm to warm proximal to distal bilateral lower extremities, +  varicosities, no pedal hair present bilateral. +1 pitting edema bilateral.   Neurological: Gross sensation present via light touch bilateral. Protective and vibratory diminished bilateral.   Dermatological: Skin is warm, dry, and supple bilateral, Left>Right hallux nails are short thick, and discolored with moderate subungal debris, no webspace macerations present bilateral, mild reactive calluses pre-ulcerative in nature, sub-met 1 and 5 bilateral. Pinpoint ulceration to distal tuft of left great toe with granular base measures  0.5cm with no signs of infection. To Right sub-met 5 callus continues to be ulcerated with a granular base noted measuring 0.2 x 0.2 cm with no fluctuance, no active drainage, no malodor no focal edema or erythema.  Musculoskeletal: Asymptomatic fat pad atrophy and hammertoe boney deformities noted bilateral. Muscular strength within normal limits without painon range of motion. No pain with calf compression bilateral.  Assessment and Plan:  Problem List Items Addressed This Visit    None    Visit Diagnoses    Right foot ulcer, limited to breakdown of skin (HCC)    -  Primary   Pre-ulcerative calluses       Diminished pulses in lower extremity       PVD (peripheral vascular disease) ([place])         -Examined patient.  -Mechanically debrided pre-ulcerative callus x3 using sterile chisel blade and debrided the wound right sub met 5 and left great toe using a sterile chisel blade removing all nonviable tissue hemostasis was achieved with manual pressure at the level of the dermis and wound measurements as above.  Patient tolerated procedure well without need for anesthesia. -Applied medihoney and advised patient to do the same at home daily. Advised patient to watch closely for any signs of infection if occurs to return to office or ER immediately. -Patient to return in 3 to 4 weeks for wound check or sooner if symptoms worsen.  Landis Martins, DPM

## 2018-01-19 DIAGNOSIS — I255 Ischemic cardiomyopathy: Secondary | ICD-10-CM | POA: Diagnosis not present

## 2018-01-19 DIAGNOSIS — I509 Heart failure, unspecified: Secondary | ICD-10-CM | POA: Diagnosis not present

## 2018-01-19 DIAGNOSIS — Z86718 Personal history of other venous thrombosis and embolism: Secondary | ICD-10-CM | POA: Diagnosis not present

## 2018-01-19 DIAGNOSIS — I2510 Atherosclerotic heart disease of native coronary artery without angina pectoris: Secondary | ICD-10-CM | POA: Diagnosis not present

## 2018-01-19 DIAGNOSIS — Z8709 Personal history of other diseases of the respiratory system: Secondary | ICD-10-CM | POA: Diagnosis not present

## 2018-01-19 DIAGNOSIS — Z8639 Personal history of other endocrine, nutritional and metabolic disease: Secondary | ICD-10-CM | POA: Diagnosis not present

## 2018-01-23 DIAGNOSIS — Z8709 Personal history of other diseases of the respiratory system: Secondary | ICD-10-CM | POA: Diagnosis not present

## 2018-01-23 DIAGNOSIS — Z8639 Personal history of other endocrine, nutritional and metabolic disease: Secondary | ICD-10-CM | POA: Diagnosis not present

## 2018-01-23 DIAGNOSIS — Z86718 Personal history of other venous thrombosis and embolism: Secondary | ICD-10-CM | POA: Diagnosis not present

## 2018-01-23 DIAGNOSIS — I509 Heart failure, unspecified: Secondary | ICD-10-CM | POA: Diagnosis not present

## 2018-01-23 DIAGNOSIS — I255 Ischemic cardiomyopathy: Secondary | ICD-10-CM | POA: Diagnosis not present

## 2018-01-23 DIAGNOSIS — I2510 Atherosclerotic heart disease of native coronary artery without angina pectoris: Secondary | ICD-10-CM | POA: Diagnosis not present

## 2018-01-24 ENCOUNTER — Ambulatory Visit (INDEPENDENT_AMBULATORY_CARE_PROVIDER_SITE_OTHER): Payer: PRIVATE HEALTH INSURANCE | Admitting: *Deleted

## 2018-01-24 DIAGNOSIS — I2510 Atherosclerotic heart disease of native coronary artery without angina pectoris: Secondary | ICD-10-CM | POA: Diagnosis not present

## 2018-01-24 DIAGNOSIS — I442 Atrioventricular block, complete: Secondary | ICD-10-CM

## 2018-01-24 DIAGNOSIS — I509 Heart failure, unspecified: Secondary | ICD-10-CM | POA: Diagnosis not present

## 2018-01-24 DIAGNOSIS — Z86718 Personal history of other venous thrombosis and embolism: Secondary | ICD-10-CM | POA: Diagnosis not present

## 2018-01-24 DIAGNOSIS — R001 Bradycardia, unspecified: Secondary | ICD-10-CM

## 2018-01-24 DIAGNOSIS — Z8639 Personal history of other endocrine, nutritional and metabolic disease: Secondary | ICD-10-CM | POA: Diagnosis not present

## 2018-01-24 DIAGNOSIS — I255 Ischemic cardiomyopathy: Secondary | ICD-10-CM | POA: Diagnosis not present

## 2018-01-24 DIAGNOSIS — Z8709 Personal history of other diseases of the respiratory system: Secondary | ICD-10-CM | POA: Diagnosis not present

## 2018-01-24 NOTE — Progress Notes (Signed)
Remote pacemaker transmission.   

## 2018-01-26 DIAGNOSIS — Z8709 Personal history of other diseases of the respiratory system: Secondary | ICD-10-CM | POA: Diagnosis not present

## 2018-01-26 DIAGNOSIS — Z86718 Personal history of other venous thrombosis and embolism: Secondary | ICD-10-CM | POA: Diagnosis not present

## 2018-01-26 DIAGNOSIS — I509 Heart failure, unspecified: Secondary | ICD-10-CM | POA: Diagnosis not present

## 2018-01-26 DIAGNOSIS — I255 Ischemic cardiomyopathy: Secondary | ICD-10-CM | POA: Diagnosis not present

## 2018-01-26 DIAGNOSIS — I2510 Atherosclerotic heart disease of native coronary artery without angina pectoris: Secondary | ICD-10-CM | POA: Diagnosis not present

## 2018-01-26 DIAGNOSIS — Z8639 Personal history of other endocrine, nutritional and metabolic disease: Secondary | ICD-10-CM | POA: Diagnosis not present

## 2018-01-27 DIAGNOSIS — Z8639 Personal history of other endocrine, nutritional and metabolic disease: Secondary | ICD-10-CM | POA: Diagnosis not present

## 2018-01-27 DIAGNOSIS — Z8709 Personal history of other diseases of the respiratory system: Secondary | ICD-10-CM | POA: Diagnosis not present

## 2018-01-27 DIAGNOSIS — I2510 Atherosclerotic heart disease of native coronary artery without angina pectoris: Secondary | ICD-10-CM | POA: Diagnosis not present

## 2018-01-27 DIAGNOSIS — Z86718 Personal history of other venous thrombosis and embolism: Secondary | ICD-10-CM | POA: Diagnosis not present

## 2018-01-27 DIAGNOSIS — I509 Heart failure, unspecified: Secondary | ICD-10-CM | POA: Diagnosis not present

## 2018-01-27 DIAGNOSIS — I255 Ischemic cardiomyopathy: Secondary | ICD-10-CM | POA: Diagnosis not present

## 2018-01-30 DIAGNOSIS — I255 Ischemic cardiomyopathy: Secondary | ICD-10-CM | POA: Diagnosis not present

## 2018-01-30 DIAGNOSIS — Z8709 Personal history of other diseases of the respiratory system: Secondary | ICD-10-CM | POA: Diagnosis not present

## 2018-01-30 DIAGNOSIS — Z8639 Personal history of other endocrine, nutritional and metabolic disease: Secondary | ICD-10-CM | POA: Diagnosis not present

## 2018-01-30 DIAGNOSIS — I509 Heart failure, unspecified: Secondary | ICD-10-CM | POA: Diagnosis not present

## 2018-01-30 DIAGNOSIS — Z86718 Personal history of other venous thrombosis and embolism: Secondary | ICD-10-CM | POA: Diagnosis not present

## 2018-01-30 DIAGNOSIS — I2510 Atherosclerotic heart disease of native coronary artery without angina pectoris: Secondary | ICD-10-CM | POA: Diagnosis not present

## 2018-02-02 ENCOUNTER — Ambulatory Visit (INDEPENDENT_AMBULATORY_CARE_PROVIDER_SITE_OTHER): Payer: Medicare Other | Admitting: Sports Medicine

## 2018-02-02 ENCOUNTER — Encounter: Payer: Self-pay | Admitting: Sports Medicine

## 2018-02-02 DIAGNOSIS — L97521 Non-pressure chronic ulcer of other part of left foot limited to breakdown of skin: Secondary | ICD-10-CM | POA: Diagnosis not present

## 2018-02-02 DIAGNOSIS — M79674 Pain in right toe(s): Secondary | ICD-10-CM

## 2018-02-02 DIAGNOSIS — I739 Peripheral vascular disease, unspecified: Secondary | ICD-10-CM

## 2018-02-02 DIAGNOSIS — L84 Corns and callosities: Secondary | ICD-10-CM

## 2018-02-02 DIAGNOSIS — M79675 Pain in left toe(s): Secondary | ICD-10-CM | POA: Diagnosis not present

## 2018-02-02 DIAGNOSIS — B351 Tinea unguium: Secondary | ICD-10-CM | POA: Diagnosis not present

## 2018-02-02 NOTE — Progress Notes (Signed)
Subjective: Anthony Osborn is a 84 y.o. male patient seen today in office for nail care and for follow-up evaluation on pre-ulcerative calluses and left great toe ulceration.  Patient has been applying medihoney to the area and dry dressing as instructed.  Patient denies nausea, vomiting, fever, chills or any other constitutional symptoms at this time.  Patient denies any local warmth redness or swelling at the left great toe however does admit some pain at bedtime especially with covers touching the toe..  Patient has no other pedal complaints at this time.   Patient is currently under hospice care.  Patient Active Problem List   Diagnosis Date Noted  . Cellulitis 05/16/2016  . Cellulitis of left lower extremity 05/13/2016  . Leg edema, left 05/13/2016  . Aftercare following surgery 04/27/2016  . Ingrown nail 04/20/2016  . Subungual hematoma of great toe of right foot 04/20/2016  . Ulcer of right foot, limited to breakdown of skin ([place]) 03/11/2016  . Cardiomyopathy, ischemic-EF 25-30% 01/24/2016  . Pacemaker (St Jude) implanted 01/23/16 01/24/2016  . Syncope 01/22/2016  . Third degree AV block ([place]) 01/22/2016  . Onychomycosis due to dermatophyte 01/20/2016  . Status post revision of total replacement of right knee 05/12/2015  . Pseudomonas infection   . DVT (deep venous thrombosis) ([place]) 04/27/2015  . Pyogenic arthritis of right knee joint ([place])   . Essential hypertension   . Encounter for palliative care   . Hx of CABG 2012 04/21/2015  . Septic arthritis of knee, right ([place]) 04/21/2015  . Cough 04/21/2015  . Hyperlipidemia   . Prostate cancer ([place])   . Hyperthyroidism     Current Outpatient Medications on File Prior to Visit  Medication Sig Dispense Refill  . acetaminophen (TYLENOL) 325 MG tablet Take 2 tablets (650 mg total) by mouth every 6 (six) hours as needed for mild pain (or Fever >/= 101).    Marland Kitchen aspirin EC 81 MG tablet Take 81 mg by mouth every evening.     Marland Kitchen  CALCIUM-MAGNESIUM-ZINC PO Take 1 tablet by mouth 2 (two) times daily. Reported on 12/15/2015    . carvedilol (COREG) 6.25 MG tablet Take 1 tablet (6.25 mg total) by mouth 2 (two) times daily. 180 tablet 3  . Cholecalciferol (VITAMIN D-3 PO) Take 1,000 Units by mouth daily.      . furosemide (LASIX) 40 MG tablet TAKE ONE TABLET BY MOUTH ONCE DAILY 30 tablet 10  . lisinopril (PRINIVIL,ZESTRIL) 5 MG tablet Take 1 tablet (5 mg total) by mouth daily. Please make overdue yearly appt with Dr. Angelena Form before anymore refills. 1st attempt 90 tablet 3  . loratadine (CLARITIN) 10 MG tablet Take 10 mg by mouth at bedtime.    . Multiple Vitamins-Minerals (ICAPS) CAPS Take 2 capsules by mouth daily.    . naphazoline-pheniramine (NAPHCON-A) 0.025-0.3 % ophthalmic solution Place 1 drop into both eyes at bedtime.    . simvastatin (ZOCOR) 10 MG tablet Take 5 mg by mouth at bedtime.     . Wound Dressings (MEDIHONEY WOUND/BURN DRESSING) GEL Apply a small amount daily to foot wounds 44 mL 1   No current facility-administered medications on file prior to visit.     No Known Allergies  Objective: Physical Exam  General: Well developed, nourished, no acute distress, awake, alert and oriented x 3  Vascular: Dorsalis pedis artery, Very faint 1/4 bilateral, Posterior tibial artery , very faint 1/4 bilateral, skin temperature warm to warm proximal to distal bilateral lower extremities, + varicosities, no pedal  hair present bilateral. +1 pitting edema bilateral.   Neurological: Gross sensation present via light touch bilateral. Protective and vibratory diminished bilateral.   Dermatological: Skin is warm, dry, and supple bilateral, Nails are mildly elongated and discolored with moderate subungal debris, no webspace macerations present bilateral, mild reactive calluses pre-ulcerative in nature, sub-met 1 and 5 bilateral.  Focal ulceration to distal tuft of left great toe with granular base measures 0.5cm with no signs of  infection.  There is no malodor no focal edema or erythema.  Musculoskeletal: Asymptomatic fat pad atrophy and hammertoe boney deformities noted bilateral. Muscular strength within normal limits without painon range of motion. No pain with calf compression bilateral.  Assessment and Plan:  Problem List Items Addressed This Visit    None    Visit Diagnoses    Pain due to onychomycosis of toenails of both feet    -  Primary   Toe ulcer, left, limited to breakdown of skin (HCC)       Toe pain, left       Pre-ulcerative calluses       PVD (peripheral vascular disease) ([place])         -Examined patient.  -Mechanically debrided all nails using a sterile nail nipper without incident -Mechanically debrided pre-ulcerative callus x4 using sterile chisel blade and debrided the wound left great toe using a sterile chisel blade removing all nonviable tissue hemostasis was achieved with manual pressure at the level of the dermis and wound measurements as above.  Patient tolerated procedure well without need for anesthesia. -Applied medihoney and advised patient to do the same at home daily. Advised patient to watch closely for any signs of infection if occurs to return to office or ER immediately. -Refrain from tight compression stockings that may irritate the toes and continue with good supportive shoes and padding to the left great toe -Patient to return in 3 to 4 weeks for wound check or sooner if symptoms worsen.  Landis Martins, DPM

## 2018-02-03 DIAGNOSIS — I509 Heart failure, unspecified: Secondary | ICD-10-CM | POA: Diagnosis not present

## 2018-02-03 DIAGNOSIS — I255 Ischemic cardiomyopathy: Secondary | ICD-10-CM | POA: Diagnosis not present

## 2018-02-03 DIAGNOSIS — Z86718 Personal history of other venous thrombosis and embolism: Secondary | ICD-10-CM | POA: Diagnosis not present

## 2018-02-03 DIAGNOSIS — Z8709 Personal history of other diseases of the respiratory system: Secondary | ICD-10-CM | POA: Diagnosis not present

## 2018-02-03 DIAGNOSIS — I2510 Atherosclerotic heart disease of native coronary artery without angina pectoris: Secondary | ICD-10-CM | POA: Diagnosis not present

## 2018-02-03 DIAGNOSIS — Z8639 Personal history of other endocrine, nutritional and metabolic disease: Secondary | ICD-10-CM | POA: Diagnosis not present

## 2018-02-05 DIAGNOSIS — Z951 Presence of aortocoronary bypass graft: Secondary | ICD-10-CM | POA: Diagnosis not present

## 2018-02-05 DIAGNOSIS — Z9181 History of falling: Secondary | ICD-10-CM | POA: Diagnosis not present

## 2018-02-05 DIAGNOSIS — Z86718 Personal history of other venous thrombosis and embolism: Secondary | ICD-10-CM | POA: Diagnosis not present

## 2018-02-05 DIAGNOSIS — I252 Old myocardial infarction: Secondary | ICD-10-CM | POA: Diagnosis not present

## 2018-02-05 DIAGNOSIS — Z8546 Personal history of malignant neoplasm of prostate: Secondary | ICD-10-CM | POA: Diagnosis not present

## 2018-02-05 DIAGNOSIS — Z8709 Personal history of other diseases of the respiratory system: Secondary | ICD-10-CM | POA: Diagnosis not present

## 2018-02-05 DIAGNOSIS — Z8679 Personal history of other diseases of the circulatory system: Secondary | ICD-10-CM | POA: Diagnosis not present

## 2018-02-05 DIAGNOSIS — I2510 Atherosclerotic heart disease of native coronary artery without angina pectoris: Secondary | ICD-10-CM | POA: Diagnosis not present

## 2018-02-05 DIAGNOSIS — I509 Heart failure, unspecified: Secondary | ICD-10-CM | POA: Diagnosis not present

## 2018-02-05 DIAGNOSIS — Z8639 Personal history of other endocrine, nutritional and metabolic disease: Secondary | ICD-10-CM | POA: Diagnosis not present

## 2018-02-05 DIAGNOSIS — I255 Ischemic cardiomyopathy: Secondary | ICD-10-CM | POA: Diagnosis not present

## 2018-02-05 DIAGNOSIS — Z950 Presence of cardiac pacemaker: Secondary | ICD-10-CM | POA: Diagnosis not present

## 2018-02-07 DIAGNOSIS — Z8639 Personal history of other endocrine, nutritional and metabolic disease: Secondary | ICD-10-CM | POA: Diagnosis not present

## 2018-02-07 DIAGNOSIS — I255 Ischemic cardiomyopathy: Secondary | ICD-10-CM | POA: Diagnosis not present

## 2018-02-07 DIAGNOSIS — I2510 Atherosclerotic heart disease of native coronary artery without angina pectoris: Secondary | ICD-10-CM | POA: Diagnosis not present

## 2018-02-07 DIAGNOSIS — I509 Heart failure, unspecified: Secondary | ICD-10-CM | POA: Diagnosis not present

## 2018-02-07 DIAGNOSIS — Z86718 Personal history of other venous thrombosis and embolism: Secondary | ICD-10-CM | POA: Diagnosis not present

## 2018-02-07 DIAGNOSIS — Z8709 Personal history of other diseases of the respiratory system: Secondary | ICD-10-CM | POA: Diagnosis not present

## 2018-02-08 DIAGNOSIS — Z8709 Personal history of other diseases of the respiratory system: Secondary | ICD-10-CM | POA: Diagnosis not present

## 2018-02-08 DIAGNOSIS — I509 Heart failure, unspecified: Secondary | ICD-10-CM | POA: Diagnosis not present

## 2018-02-08 DIAGNOSIS — I2510 Atherosclerotic heart disease of native coronary artery without angina pectoris: Secondary | ICD-10-CM | POA: Diagnosis not present

## 2018-02-08 DIAGNOSIS — Z86718 Personal history of other venous thrombosis and embolism: Secondary | ICD-10-CM | POA: Diagnosis not present

## 2018-02-08 DIAGNOSIS — Z8639 Personal history of other endocrine, nutritional and metabolic disease: Secondary | ICD-10-CM | POA: Diagnosis not present

## 2018-02-08 DIAGNOSIS — I255 Ischemic cardiomyopathy: Secondary | ICD-10-CM | POA: Diagnosis not present

## 2018-02-10 DIAGNOSIS — I2510 Atherosclerotic heart disease of native coronary artery without angina pectoris: Secondary | ICD-10-CM | POA: Diagnosis not present

## 2018-02-10 DIAGNOSIS — Z8639 Personal history of other endocrine, nutritional and metabolic disease: Secondary | ICD-10-CM | POA: Diagnosis not present

## 2018-02-10 DIAGNOSIS — Z86718 Personal history of other venous thrombosis and embolism: Secondary | ICD-10-CM | POA: Diagnosis not present

## 2018-02-10 DIAGNOSIS — I509 Heart failure, unspecified: Secondary | ICD-10-CM | POA: Diagnosis not present

## 2018-02-10 DIAGNOSIS — I255 Ischemic cardiomyopathy: Secondary | ICD-10-CM | POA: Diagnosis not present

## 2018-02-10 DIAGNOSIS — Z8709 Personal history of other diseases of the respiratory system: Secondary | ICD-10-CM | POA: Diagnosis not present

## 2018-02-13 DIAGNOSIS — Z8639 Personal history of other endocrine, nutritional and metabolic disease: Secondary | ICD-10-CM | POA: Diagnosis not present

## 2018-02-13 DIAGNOSIS — I2510 Atherosclerotic heart disease of native coronary artery without angina pectoris: Secondary | ICD-10-CM | POA: Diagnosis not present

## 2018-02-13 DIAGNOSIS — Z86718 Personal history of other venous thrombosis and embolism: Secondary | ICD-10-CM | POA: Diagnosis not present

## 2018-02-13 DIAGNOSIS — Z8709 Personal history of other diseases of the respiratory system: Secondary | ICD-10-CM | POA: Diagnosis not present

## 2018-02-13 DIAGNOSIS — I509 Heart failure, unspecified: Secondary | ICD-10-CM | POA: Diagnosis not present

## 2018-02-13 DIAGNOSIS — I255 Ischemic cardiomyopathy: Secondary | ICD-10-CM | POA: Diagnosis not present

## 2018-02-14 DIAGNOSIS — C44629 Squamous cell carcinoma of skin of left upper limb, including shoulder: Secondary | ICD-10-CM | POA: Diagnosis not present

## 2018-02-15 ENCOUNTER — Ambulatory Visit: Payer: PRIVATE HEALTH INSURANCE | Admitting: Sports Medicine

## 2018-02-16 DIAGNOSIS — H353211 Exudative age-related macular degeneration, right eye, with active choroidal neovascularization: Secondary | ICD-10-CM | POA: Diagnosis not present

## 2018-02-17 DIAGNOSIS — Z8709 Personal history of other diseases of the respiratory system: Secondary | ICD-10-CM | POA: Diagnosis not present

## 2018-02-17 DIAGNOSIS — I2510 Atherosclerotic heart disease of native coronary artery without angina pectoris: Secondary | ICD-10-CM | POA: Diagnosis not present

## 2018-02-17 DIAGNOSIS — I255 Ischemic cardiomyopathy: Secondary | ICD-10-CM | POA: Diagnosis not present

## 2018-02-17 DIAGNOSIS — I509 Heart failure, unspecified: Secondary | ICD-10-CM | POA: Diagnosis not present

## 2018-02-17 DIAGNOSIS — Z8639 Personal history of other endocrine, nutritional and metabolic disease: Secondary | ICD-10-CM | POA: Diagnosis not present

## 2018-02-17 DIAGNOSIS — Z86718 Personal history of other venous thrombosis and embolism: Secondary | ICD-10-CM | POA: Diagnosis not present

## 2018-02-20 DIAGNOSIS — Z8709 Personal history of other diseases of the respiratory system: Secondary | ICD-10-CM | POA: Diagnosis not present

## 2018-02-20 DIAGNOSIS — I2510 Atherosclerotic heart disease of native coronary artery without angina pectoris: Secondary | ICD-10-CM | POA: Diagnosis not present

## 2018-02-20 DIAGNOSIS — I509 Heart failure, unspecified: Secondary | ICD-10-CM | POA: Diagnosis not present

## 2018-02-20 DIAGNOSIS — Z86718 Personal history of other venous thrombosis and embolism: Secondary | ICD-10-CM | POA: Diagnosis not present

## 2018-02-20 DIAGNOSIS — Z8639 Personal history of other endocrine, nutritional and metabolic disease: Secondary | ICD-10-CM | POA: Diagnosis not present

## 2018-02-20 DIAGNOSIS — I255 Ischemic cardiomyopathy: Secondary | ICD-10-CM | POA: Diagnosis not present

## 2018-02-23 DIAGNOSIS — I509 Heart failure, unspecified: Secondary | ICD-10-CM | POA: Diagnosis not present

## 2018-02-23 DIAGNOSIS — Z8639 Personal history of other endocrine, nutritional and metabolic disease: Secondary | ICD-10-CM | POA: Diagnosis not present

## 2018-02-23 DIAGNOSIS — Z8709 Personal history of other diseases of the respiratory system: Secondary | ICD-10-CM | POA: Diagnosis not present

## 2018-02-23 DIAGNOSIS — I2510 Atherosclerotic heart disease of native coronary artery without angina pectoris: Secondary | ICD-10-CM | POA: Diagnosis not present

## 2018-02-23 DIAGNOSIS — Z86718 Personal history of other venous thrombosis and embolism: Secondary | ICD-10-CM | POA: Diagnosis not present

## 2018-02-23 DIAGNOSIS — I255 Ischemic cardiomyopathy: Secondary | ICD-10-CM | POA: Diagnosis not present

## 2018-02-24 DIAGNOSIS — Z8639 Personal history of other endocrine, nutritional and metabolic disease: Secondary | ICD-10-CM | POA: Diagnosis not present

## 2018-02-24 DIAGNOSIS — Z86718 Personal history of other venous thrombosis and embolism: Secondary | ICD-10-CM | POA: Diagnosis not present

## 2018-02-24 DIAGNOSIS — I255 Ischemic cardiomyopathy: Secondary | ICD-10-CM | POA: Diagnosis not present

## 2018-02-24 DIAGNOSIS — I2510 Atherosclerotic heart disease of native coronary artery without angina pectoris: Secondary | ICD-10-CM | POA: Diagnosis not present

## 2018-02-24 DIAGNOSIS — Z8709 Personal history of other diseases of the respiratory system: Secondary | ICD-10-CM | POA: Diagnosis not present

## 2018-02-24 DIAGNOSIS — I509 Heart failure, unspecified: Secondary | ICD-10-CM | POA: Diagnosis not present

## 2018-02-27 DIAGNOSIS — Z86718 Personal history of other venous thrombosis and embolism: Secondary | ICD-10-CM | POA: Diagnosis not present

## 2018-02-27 DIAGNOSIS — I509 Heart failure, unspecified: Secondary | ICD-10-CM | POA: Diagnosis not present

## 2018-02-27 DIAGNOSIS — I2510 Atherosclerotic heart disease of native coronary artery without angina pectoris: Secondary | ICD-10-CM | POA: Diagnosis not present

## 2018-02-27 DIAGNOSIS — Z8639 Personal history of other endocrine, nutritional and metabolic disease: Secondary | ICD-10-CM | POA: Diagnosis not present

## 2018-02-27 DIAGNOSIS — I255 Ischemic cardiomyopathy: Secondary | ICD-10-CM | POA: Diagnosis not present

## 2018-02-27 DIAGNOSIS — Z8709 Personal history of other diseases of the respiratory system: Secondary | ICD-10-CM | POA: Diagnosis not present

## 2018-02-28 LAB — CUP PACEART REMOTE DEVICE CHECK
Battery Remaining Percentage: 95.5 %
Battery Voltage: 2.99 V
Brady Statistic AP VP Percent: 3 %
Brady Statistic AS VS Percent: 1 %
Brady Statistic RA Percent Paced: 2.8 %
Implantable Lead Implant Date: 20170818
Implantable Lead Implant Date: 20170818
Implantable Lead Location: 753859
Implantable Pulse Generator Implant Date: 20170818
Lead Channel Impedance Value: 450 Ohm
Lead Channel Pacing Threshold Amplitude: 1 V
Lead Channel Pacing Threshold Pulse Width: 0.4 ms
Lead Channel Pacing Threshold Pulse Width: 0.4 ms
Lead Channel Sensing Intrinsic Amplitude: 3.6 mV
Lead Channel Setting Pacing Amplitude: 1 V
Lead Channel Setting Sensing Sensitivity: 2 mV
MDC IDC LEAD LOCATION: 753860
MDC IDC MSMT BATTERY REMAINING LONGEVITY: 130 mo
MDC IDC MSMT LEADCHNL RA IMPEDANCE VALUE: 460 Ohm
MDC IDC MSMT LEADCHNL RV PACING THRESHOLD AMPLITUDE: 0.75 V
MDC IDC MSMT LEADCHNL RV SENSING INTR AMPL: 8.1 mV
MDC IDC PG SERIAL: 7939408
MDC IDC SESS DTM: 20190820060014
MDC IDC SET LEADCHNL RA PACING AMPLITUDE: 2.5 V
MDC IDC SET LEADCHNL RV PACING PULSEWIDTH: 0.4 ms
MDC IDC STAT BRADY AP VS PERCENT: 1 %
MDC IDC STAT BRADY AS VP PERCENT: 97 %
MDC IDC STAT BRADY RV PERCENT PACED: 99 %

## 2018-03-02 ENCOUNTER — Encounter: Payer: Self-pay | Admitting: Sports Medicine

## 2018-03-02 ENCOUNTER — Ambulatory Visit (INDEPENDENT_AMBULATORY_CARE_PROVIDER_SITE_OTHER): Payer: Medicare Other | Admitting: Sports Medicine

## 2018-03-02 DIAGNOSIS — L97521 Non-pressure chronic ulcer of other part of left foot limited to breakdown of skin: Secondary | ICD-10-CM | POA: Diagnosis not present

## 2018-03-02 DIAGNOSIS — I739 Peripheral vascular disease, unspecified: Secondary | ICD-10-CM

## 2018-03-02 DIAGNOSIS — R0989 Other specified symptoms and signs involving the circulatory and respiratory systems: Secondary | ICD-10-CM | POA: Diagnosis not present

## 2018-03-02 DIAGNOSIS — L84 Corns and callosities: Secondary | ICD-10-CM | POA: Diagnosis not present

## 2018-03-02 NOTE — Progress Notes (Signed)
Subjective: Bravlio Luca is a 84 y.o. male patient seen today in office for nail care and for follow-up evaluation of pre-ulcerative calluses and left great toe ulceration.  Patient has been applying medihoney to the area and dry dressing as instructed.  Patient reports that the areas are sore especially the left great toe but otherwise denies nausea, vomiting, fever, chills or any other constitutional symptoms at this time.  Patient has no other pedal complaints at this time.   Patient is currently under hospice care and assisted by wife this visit.  Patient Active Problem List   Diagnosis Date Noted  . Cellulitis 05/16/2016  . Cellulitis of left lower extremity 05/13/2016  . Leg edema, left 05/13/2016  . Aftercare following surgery 04/27/2016  . Ingrown nail 04/20/2016  . Subungual hematoma of great toe of right foot 04/20/2016  . Ulcer of right foot, limited to breakdown of skin ([place]) 03/11/2016  . Cardiomyopathy, ischemic-EF 25-30% 01/24/2016  . Pacemaker (St Jude) implanted 01/23/16 01/24/2016  . Syncope 01/22/2016  . Third degree AV block ([place]) 01/22/2016  . Onychomycosis due to dermatophyte 01/20/2016  . Status post revision of total replacement of right knee 05/12/2015  . Pseudomonas infection   . DVT (deep venous thrombosis) ([place]) 04/27/2015  . Pyogenic arthritis of right knee joint ([place])   . Essential hypertension   . Encounter for palliative care   . Hx of CABG 2012 04/21/2015  . Septic arthritis of knee, right ([place]) 04/21/2015  . Cough 04/21/2015  . Hyperlipidemia   . Prostate cancer ([place])   . Hyperthyroidism     Current Outpatient Medications on File Prior to Visit  Medication Sig Dispense Refill  . acetaminophen (TYLENOL) 325 MG tablet Take 2 tablets (650 mg total) by mouth every 6 (six) hours as needed for mild pain (or Fever >/= 101).    Marland Kitchen aspirin EC 81 MG tablet Take 81 mg by mouth every evening.     Marland Kitchen CALCIUM-MAGNESIUM-ZINC PO Take 1 tablet by mouth 2 (two)  times daily. Reported on 12/15/2015    . carvedilol (COREG) 6.25 MG tablet Take 1 tablet (6.25 mg total) by mouth 2 (two) times daily. 180 tablet 3  . Cholecalciferol (VITAMIN D-3 PO) Take 1,000 Units by mouth daily.      . furosemide (LASIX) 40 MG tablet TAKE ONE TABLET BY MOUTH ONCE DAILY 30 tablet 10  . lisinopril (PRINIVIL,ZESTRIL) 5 MG tablet Take 1 tablet (5 mg total) by mouth daily. Please make overdue yearly appt with Dr. Angelena Form before anymore refills. 1st attempt 90 tablet 3  . loratadine (CLARITIN) 10 MG tablet Take 10 mg by mouth at bedtime.    . Multiple Vitamins-Minerals (ICAPS) CAPS Take 2 capsules by mouth daily.    . naphazoline-pheniramine (NAPHCON-A) 0.025-0.3 % ophthalmic solution Place 1 drop into both eyes at bedtime.    . simvastatin (ZOCOR) 10 MG tablet Take 5 mg by mouth at bedtime.     . Wound Dressings (MEDIHONEY WOUND/BURN DRESSING) GEL Apply a small amount daily to foot wounds 44 mL 1   No current facility-administered medications on file prior to visit.     No Known Allergies  Objective: Physical Exam  General: Well developed, nourished, no acute distress, awake, alert and oriented x 3  Vascular: Dorsalis pedis artery, Very faint 1/4 bilateral, Posterior tibial artery 0/4 bilateral, skin temperature warm to warm proximal to distal bilateral lower extremities, + varicosities, no pedal hair present bilateral. +1 pitting edema bilateral.   Neurological: Gross  sensation present via light touch bilateral. Protective and vibratory diminished bilateral.   Dermatological: Skin is warm, dry, and supple bilateral, Nails are short and discolored with moderate subungal debris, no webspace macerations present bilateral, mild reactive calluses pre-ulcerative in nature, sub-met 1 and 5 bilateral.  Focal ulceration to distal tuft of left great toe with granular base measures 0.5x0.9cm with no signs of infection.  There is no malodor no focal edema or  erythema.  Musculoskeletal: Asymptomatic fat pad atrophy and hammertoe boney deformities noted bilateral. Muscular strength within normal limits without painon range of motion. No pain with calf compression bilateral.  Assessment and Plan:  Problem List Items Addressed This Visit    None    Visit Diagnoses    Toe ulcer, left, limited to breakdown of skin (HCC)    -  Primary   Pre-ulcerative calluses       PVD (peripheral vascular disease) ([place])       Diminished pulses in lower extremity         -Examined patient.  -Mechanically debrided pre-ulcerative callus x4 using sterile chisel blade and debrided the wound left great toe using a sterile chisel blade removing all nonviable tissue hemostasis was achieved with manual pressure at the level of the dermis and wound measurements as above.  Patient tolerated procedure well without need for anesthesia. -Applied medihoney and advised patient to do the same at home daily. Advised patient to watch closely for any signs of infection if occurs to return to office or ER immediately. -Dispensed toe foam padding to use at bedtime since patient refused to use it during the day when he is in shoes -Patient to return in 3 to 4 weeks for wound check or sooner if symptoms worsen.  Landis Martins, DPM

## 2018-03-03 DIAGNOSIS — Z8639 Personal history of other endocrine, nutritional and metabolic disease: Secondary | ICD-10-CM | POA: Diagnosis not present

## 2018-03-03 DIAGNOSIS — Z8709 Personal history of other diseases of the respiratory system: Secondary | ICD-10-CM | POA: Diagnosis not present

## 2018-03-03 DIAGNOSIS — I255 Ischemic cardiomyopathy: Secondary | ICD-10-CM | POA: Diagnosis not present

## 2018-03-03 DIAGNOSIS — I2510 Atherosclerotic heart disease of native coronary artery without angina pectoris: Secondary | ICD-10-CM | POA: Diagnosis not present

## 2018-03-03 DIAGNOSIS — Z86718 Personal history of other venous thrombosis and embolism: Secondary | ICD-10-CM | POA: Diagnosis not present

## 2018-03-03 DIAGNOSIS — I509 Heart failure, unspecified: Secondary | ICD-10-CM | POA: Diagnosis not present

## 2018-03-06 DIAGNOSIS — Z8709 Personal history of other diseases of the respiratory system: Secondary | ICD-10-CM | POA: Diagnosis not present

## 2018-03-06 DIAGNOSIS — Z86718 Personal history of other venous thrombosis and embolism: Secondary | ICD-10-CM | POA: Diagnosis not present

## 2018-03-06 DIAGNOSIS — I509 Heart failure, unspecified: Secondary | ICD-10-CM | POA: Diagnosis not present

## 2018-03-06 DIAGNOSIS — I2510 Atherosclerotic heart disease of native coronary artery without angina pectoris: Secondary | ICD-10-CM | POA: Diagnosis not present

## 2018-03-06 DIAGNOSIS — I255 Ischemic cardiomyopathy: Secondary | ICD-10-CM | POA: Diagnosis not present

## 2018-03-06 DIAGNOSIS — Z8639 Personal history of other endocrine, nutritional and metabolic disease: Secondary | ICD-10-CM | POA: Diagnosis not present

## 2018-03-07 DIAGNOSIS — I252 Old myocardial infarction: Secondary | ICD-10-CM | POA: Diagnosis not present

## 2018-03-07 DIAGNOSIS — I2510 Atherosclerotic heart disease of native coronary artery without angina pectoris: Secondary | ICD-10-CM | POA: Diagnosis not present

## 2018-03-07 DIAGNOSIS — Z951 Presence of aortocoronary bypass graft: Secondary | ICD-10-CM | POA: Diagnosis not present

## 2018-03-07 DIAGNOSIS — Z9181 History of falling: Secondary | ICD-10-CM | POA: Diagnosis not present

## 2018-03-07 DIAGNOSIS — Z950 Presence of cardiac pacemaker: Secondary | ICD-10-CM | POA: Diagnosis not present

## 2018-03-07 DIAGNOSIS — Z86718 Personal history of other venous thrombosis and embolism: Secondary | ICD-10-CM | POA: Diagnosis not present

## 2018-03-07 DIAGNOSIS — Z8709 Personal history of other diseases of the respiratory system: Secondary | ICD-10-CM | POA: Diagnosis not present

## 2018-03-07 DIAGNOSIS — I509 Heart failure, unspecified: Secondary | ICD-10-CM | POA: Diagnosis not present

## 2018-03-07 DIAGNOSIS — Z8679 Personal history of other diseases of the circulatory system: Secondary | ICD-10-CM | POA: Diagnosis not present

## 2018-03-07 DIAGNOSIS — Z8546 Personal history of malignant neoplasm of prostate: Secondary | ICD-10-CM | POA: Diagnosis not present

## 2018-03-07 DIAGNOSIS — I255 Ischemic cardiomyopathy: Secondary | ICD-10-CM | POA: Diagnosis not present

## 2018-03-07 DIAGNOSIS — Z8639 Personal history of other endocrine, nutritional and metabolic disease: Secondary | ICD-10-CM | POA: Diagnosis not present

## 2018-03-09 DIAGNOSIS — Z8709 Personal history of other diseases of the respiratory system: Secondary | ICD-10-CM | POA: Diagnosis not present

## 2018-03-09 DIAGNOSIS — Z86718 Personal history of other venous thrombosis and embolism: Secondary | ICD-10-CM | POA: Diagnosis not present

## 2018-03-09 DIAGNOSIS — Z951 Presence of aortocoronary bypass graft: Secondary | ICD-10-CM | POA: Diagnosis not present

## 2018-03-09 DIAGNOSIS — I509 Heart failure, unspecified: Secondary | ICD-10-CM | POA: Diagnosis not present

## 2018-03-09 DIAGNOSIS — I255 Ischemic cardiomyopathy: Secondary | ICD-10-CM | POA: Diagnosis not present

## 2018-03-09 DIAGNOSIS — I2510 Atherosclerotic heart disease of native coronary artery without angina pectoris: Secondary | ICD-10-CM | POA: Diagnosis not present

## 2018-03-13 DIAGNOSIS — I2510 Atherosclerotic heart disease of native coronary artery without angina pectoris: Secondary | ICD-10-CM | POA: Diagnosis not present

## 2018-03-13 DIAGNOSIS — Z86718 Personal history of other venous thrombosis and embolism: Secondary | ICD-10-CM | POA: Diagnosis not present

## 2018-03-13 DIAGNOSIS — Z8709 Personal history of other diseases of the respiratory system: Secondary | ICD-10-CM | POA: Diagnosis not present

## 2018-03-13 DIAGNOSIS — I255 Ischemic cardiomyopathy: Secondary | ICD-10-CM | POA: Diagnosis not present

## 2018-03-13 DIAGNOSIS — I509 Heart failure, unspecified: Secondary | ICD-10-CM | POA: Diagnosis not present

## 2018-03-13 DIAGNOSIS — Z951 Presence of aortocoronary bypass graft: Secondary | ICD-10-CM | POA: Diagnosis not present

## 2018-03-17 DIAGNOSIS — I509 Heart failure, unspecified: Secondary | ICD-10-CM | POA: Diagnosis not present

## 2018-03-17 DIAGNOSIS — I2510 Atherosclerotic heart disease of native coronary artery without angina pectoris: Secondary | ICD-10-CM | POA: Diagnosis not present

## 2018-03-17 DIAGNOSIS — I255 Ischemic cardiomyopathy: Secondary | ICD-10-CM | POA: Diagnosis not present

## 2018-03-17 DIAGNOSIS — Z86718 Personal history of other venous thrombosis and embolism: Secondary | ICD-10-CM | POA: Diagnosis not present

## 2018-03-17 DIAGNOSIS — Z8709 Personal history of other diseases of the respiratory system: Secondary | ICD-10-CM | POA: Diagnosis not present

## 2018-03-17 DIAGNOSIS — Z951 Presence of aortocoronary bypass graft: Secondary | ICD-10-CM | POA: Diagnosis not present

## 2018-03-20 DIAGNOSIS — Z951 Presence of aortocoronary bypass graft: Secondary | ICD-10-CM | POA: Diagnosis not present

## 2018-03-20 DIAGNOSIS — I2510 Atherosclerotic heart disease of native coronary artery without angina pectoris: Secondary | ICD-10-CM | POA: Diagnosis not present

## 2018-03-20 DIAGNOSIS — Z86718 Personal history of other venous thrombosis and embolism: Secondary | ICD-10-CM | POA: Diagnosis not present

## 2018-03-20 DIAGNOSIS — I509 Heart failure, unspecified: Secondary | ICD-10-CM | POA: Diagnosis not present

## 2018-03-20 DIAGNOSIS — I255 Ischemic cardiomyopathy: Secondary | ICD-10-CM | POA: Diagnosis not present

## 2018-03-20 DIAGNOSIS — Z8709 Personal history of other diseases of the respiratory system: Secondary | ICD-10-CM | POA: Diagnosis not present

## 2018-03-23 DIAGNOSIS — H353211 Exudative age-related macular degeneration, right eye, with active choroidal neovascularization: Secondary | ICD-10-CM | POA: Diagnosis not present

## 2018-03-23 DIAGNOSIS — Z951 Presence of aortocoronary bypass graft: Secondary | ICD-10-CM | POA: Diagnosis not present

## 2018-03-23 DIAGNOSIS — Z86718 Personal history of other venous thrombosis and embolism: Secondary | ICD-10-CM | POA: Diagnosis not present

## 2018-03-23 DIAGNOSIS — I255 Ischemic cardiomyopathy: Secondary | ICD-10-CM | POA: Diagnosis not present

## 2018-03-23 DIAGNOSIS — Z8709 Personal history of other diseases of the respiratory system: Secondary | ICD-10-CM | POA: Diagnosis not present

## 2018-03-23 DIAGNOSIS — I509 Heart failure, unspecified: Secondary | ICD-10-CM | POA: Diagnosis not present

## 2018-03-23 DIAGNOSIS — I2510 Atherosclerotic heart disease of native coronary artery without angina pectoris: Secondary | ICD-10-CM | POA: Diagnosis not present

## 2018-03-24 DIAGNOSIS — Z86718 Personal history of other venous thrombosis and embolism: Secondary | ICD-10-CM | POA: Diagnosis not present

## 2018-03-24 DIAGNOSIS — I509 Heart failure, unspecified: Secondary | ICD-10-CM | POA: Diagnosis not present

## 2018-03-24 DIAGNOSIS — I2510 Atherosclerotic heart disease of native coronary artery without angina pectoris: Secondary | ICD-10-CM | POA: Diagnosis not present

## 2018-03-24 DIAGNOSIS — Z8709 Personal history of other diseases of the respiratory system: Secondary | ICD-10-CM | POA: Diagnosis not present

## 2018-03-24 DIAGNOSIS — Z951 Presence of aortocoronary bypass graft: Secondary | ICD-10-CM | POA: Diagnosis not present

## 2018-03-24 DIAGNOSIS — I255 Ischemic cardiomyopathy: Secondary | ICD-10-CM | POA: Diagnosis not present

## 2018-03-27 DIAGNOSIS — Z951 Presence of aortocoronary bypass graft: Secondary | ICD-10-CM | POA: Diagnosis not present

## 2018-03-27 DIAGNOSIS — I255 Ischemic cardiomyopathy: Secondary | ICD-10-CM | POA: Diagnosis not present

## 2018-03-27 DIAGNOSIS — I509 Heart failure, unspecified: Secondary | ICD-10-CM | POA: Diagnosis not present

## 2018-03-27 DIAGNOSIS — Z8709 Personal history of other diseases of the respiratory system: Secondary | ICD-10-CM | POA: Diagnosis not present

## 2018-03-27 DIAGNOSIS — Z86718 Personal history of other venous thrombosis and embolism: Secondary | ICD-10-CM | POA: Diagnosis not present

## 2018-03-27 DIAGNOSIS — I2510 Atherosclerotic heart disease of native coronary artery without angina pectoris: Secondary | ICD-10-CM | POA: Diagnosis not present

## 2018-03-30 ENCOUNTER — Ambulatory Visit: Payer: Medicare Other | Admitting: Sports Medicine

## 2018-03-30 DIAGNOSIS — I509 Heart failure, unspecified: Secondary | ICD-10-CM | POA: Diagnosis not present

## 2018-03-30 DIAGNOSIS — I2510 Atherosclerotic heart disease of native coronary artery without angina pectoris: Secondary | ICD-10-CM | POA: Diagnosis not present

## 2018-03-30 DIAGNOSIS — Z8709 Personal history of other diseases of the respiratory system: Secondary | ICD-10-CM | POA: Diagnosis not present

## 2018-03-30 DIAGNOSIS — I255 Ischemic cardiomyopathy: Secondary | ICD-10-CM | POA: Diagnosis not present

## 2018-03-30 DIAGNOSIS — Z86718 Personal history of other venous thrombosis and embolism: Secondary | ICD-10-CM | POA: Diagnosis not present

## 2018-03-30 DIAGNOSIS — Z951 Presence of aortocoronary bypass graft: Secondary | ICD-10-CM | POA: Diagnosis not present

## 2018-04-03 DIAGNOSIS — I509 Heart failure, unspecified: Secondary | ICD-10-CM | POA: Diagnosis not present

## 2018-04-03 DIAGNOSIS — Z86718 Personal history of other venous thrombosis and embolism: Secondary | ICD-10-CM | POA: Diagnosis not present

## 2018-04-03 DIAGNOSIS — Z8709 Personal history of other diseases of the respiratory system: Secondary | ICD-10-CM | POA: Diagnosis not present

## 2018-04-03 DIAGNOSIS — I2510 Atherosclerotic heart disease of native coronary artery without angina pectoris: Secondary | ICD-10-CM | POA: Diagnosis not present

## 2018-04-03 DIAGNOSIS — Z951 Presence of aortocoronary bypass graft: Secondary | ICD-10-CM | POA: Diagnosis not present

## 2018-04-03 DIAGNOSIS — I255 Ischemic cardiomyopathy: Secondary | ICD-10-CM | POA: Diagnosis not present

## 2018-04-06 DIAGNOSIS — I255 Ischemic cardiomyopathy: Secondary | ICD-10-CM | POA: Diagnosis not present

## 2018-04-06 DIAGNOSIS — Z951 Presence of aortocoronary bypass graft: Secondary | ICD-10-CM | POA: Diagnosis not present

## 2018-04-06 DIAGNOSIS — Z8709 Personal history of other diseases of the respiratory system: Secondary | ICD-10-CM | POA: Diagnosis not present

## 2018-04-06 DIAGNOSIS — I2510 Atherosclerotic heart disease of native coronary artery without angina pectoris: Secondary | ICD-10-CM | POA: Diagnosis not present

## 2018-04-06 DIAGNOSIS — I509 Heart failure, unspecified: Secondary | ICD-10-CM | POA: Diagnosis not present

## 2018-04-06 DIAGNOSIS — Z86718 Personal history of other venous thrombosis and embolism: Secondary | ICD-10-CM | POA: Diagnosis not present

## 2018-04-07 ENCOUNTER — Ambulatory Visit (INDEPENDENT_AMBULATORY_CARE_PROVIDER_SITE_OTHER): Payer: Medicare Other | Admitting: Sports Medicine

## 2018-04-07 ENCOUNTER — Encounter: Payer: Self-pay | Admitting: Sports Medicine

## 2018-04-07 DIAGNOSIS — Z86718 Personal history of other venous thrombosis and embolism: Secondary | ICD-10-CM | POA: Diagnosis not present

## 2018-04-07 DIAGNOSIS — Z951 Presence of aortocoronary bypass graft: Secondary | ICD-10-CM | POA: Diagnosis not present

## 2018-04-07 DIAGNOSIS — Z9181 History of falling: Secondary | ICD-10-CM | POA: Diagnosis not present

## 2018-04-07 DIAGNOSIS — I255 Ischemic cardiomyopathy: Secondary | ICD-10-CM | POA: Diagnosis not present

## 2018-04-07 DIAGNOSIS — R0989 Other specified symptoms and signs involving the circulatory and respiratory systems: Secondary | ICD-10-CM

## 2018-04-07 DIAGNOSIS — Z8709 Personal history of other diseases of the respiratory system: Secondary | ICD-10-CM | POA: Diagnosis not present

## 2018-04-07 DIAGNOSIS — L97521 Non-pressure chronic ulcer of other part of left foot limited to breakdown of skin: Secondary | ICD-10-CM

## 2018-04-07 DIAGNOSIS — Z8679 Personal history of other diseases of the circulatory system: Secondary | ICD-10-CM | POA: Diagnosis not present

## 2018-04-07 DIAGNOSIS — L84 Corns and callosities: Secondary | ICD-10-CM

## 2018-04-07 DIAGNOSIS — I252 Old myocardial infarction: Secondary | ICD-10-CM | POA: Diagnosis not present

## 2018-04-07 DIAGNOSIS — Z950 Presence of cardiac pacemaker: Secondary | ICD-10-CM | POA: Diagnosis not present

## 2018-04-07 DIAGNOSIS — Z8546 Personal history of malignant neoplasm of prostate: Secondary | ICD-10-CM | POA: Diagnosis not present

## 2018-04-07 DIAGNOSIS — Z8639 Personal history of other endocrine, nutritional and metabolic disease: Secondary | ICD-10-CM | POA: Diagnosis not present

## 2018-04-07 DIAGNOSIS — I509 Heart failure, unspecified: Secondary | ICD-10-CM | POA: Diagnosis not present

## 2018-04-07 DIAGNOSIS — I2510 Atherosclerotic heart disease of native coronary artery without angina pectoris: Secondary | ICD-10-CM | POA: Diagnosis not present

## 2018-04-07 DIAGNOSIS — I739 Peripheral vascular disease, unspecified: Secondary | ICD-10-CM

## 2018-04-07 NOTE — Progress Notes (Signed)
Subjective: Kristine Tiley is a 84 y.o. male patient seen today in office for nail care and for follow-up evaluation of pre-ulcerative calluses and left great toe ulceration.  Patient has been applying medihoney to the area and dry dressing as instructed with help from wife and states that all areas feel better and that there is a dry scab on the top of the toe. Patient denies nausea, vomiting, fever, chills or any other constitutional symptoms at this time.  Patient has no other pedal complaints at this time.   Patient is currently under hospice care and assisted by wife this visit.  Patient Active Problem List   Diagnosis Date Noted  . Advanced age 26/18/2019  . Congestive cardiomyopathy ([place]) 11/22/2017  . Palpitations 11/22/2017  . Frail elderly 11/17/2017  . Anemia, macrocytic 10/21/2017  . Chronic fatigue 10/10/2017  . Cellulitis 05/16/2016  . Cellulitis of left lower extremity 05/13/2016  . Leg edema, left 05/13/2016  . Aftercare following surgery 04/27/2016  . Ingrown nail 04/20/2016  . Subungual hematoma of great toe of right foot 04/20/2016  . Ulcer of right foot, limited to breakdown of skin ([place]) 03/11/2016  . Cardiomyopathy, ischemic-EF 25-30% 01/24/2016  . Pacemaker (St Jude) implanted 01/23/16 01/24/2016  . Syncope 01/22/2016  . Third degree AV block ([place]) 01/22/2016  . Onychomycosis due to dermatophyte 01/20/2016  . Status post revision of total replacement of right knee 05/12/2015  . Pseudomonas infection   . DVT (deep venous thrombosis) ([place]) 04/27/2015  . Pyogenic arthritis of right knee joint ([place])   . Essential hypertension   . Encounter for palliative care   . Hx of CABG 2012 04/21/2015  . Septic arthritis of knee, right ([place]) 04/21/2015  . Cough 04/21/2015  . Hyperlipidemia   . Prostate cancer ([place])   . Hyperthyroidism     Current Outpatient Medications on File Prior to Visit  Medication Sig Dispense Refill  . acetaminophen (TYLENOL) 325 MG tablet Take 2  tablets (650 mg total) by mouth every 6 (six) hours as needed for mild pain (or Fever >/= 101).    Marland Kitchen aspirin EC 81 MG tablet Take 81 mg by mouth every evening.     Marland Kitchen CALCIUM-MAGNESIUM-ZINC PO Take 1 tablet by mouth 2 (two) times daily. Reported on 12/15/2015    . carvedilol (COREG) 6.25 MG tablet Take 1 tablet (6.25 mg total) by mouth 2 (two) times daily. 180 tablet 3  . Cholecalciferol (VITAMIN D-3 PO) Take 1,000 Units by mouth daily.      . furosemide (LASIX) 40 MG tablet TAKE ONE TABLET BY MOUTH ONCE DAILY 30 tablet 10  . lisinopril (PRINIVIL,ZESTRIL) 5 MG tablet Take 1 tablet (5 mg total) by mouth daily. Please make overdue yearly appt with Dr. Angelena Form before anymore refills. 1st attempt 90 tablet 3  . loratadine (CLARITIN) 10 MG tablet Take 10 mg by mouth at bedtime.    . Multiple Vitamins-Minerals (ICAPS) CAPS Take 2 capsules by mouth daily.    . naphazoline-pheniramine (NAPHCON-A) 0.025-0.3 % ophthalmic solution Place 1 drop into both eyes at bedtime.    . simvastatin (ZOCOR) 10 MG tablet Take 5 mg by mouth at bedtime.     . Wound Dressings (MEDIHONEY WOUND/BURN DRESSING) GEL Apply a small amount daily to foot wounds 44 mL 1   No current facility-administered medications on file prior to visit.     No Known Allergies  Objective: Physical Exam  General: Well developed, nourished, no acute distress, awake, alert and oriented x 3  Vascular: Dorsalis pedis artery, Very faint 1/4 bilateral, Posterior tibial artery 0/4 bilateral, skin temperature warm to warm proximal to distal bilateral lower extremities, + varicosities, no pedal hair present bilateral. +1 pitting edema bilateral.   Neurological: Gross sensation present via light touch bilateral. Protective and vibratory diminished bilateral.   Dermatological: Skin is warm, dry, and supple bilateral, Nails are short and discolored with moderate subungal debris, no webspace macerations present bilateral, mild reactive calluses  pre-ulcerative in nature, sub-met 1 and 5 bilateral.  Focal ulceration to distal tuft of left great toe with granular base measures 0.2x0.3cm with no signs of infection.  There is no malodor no focal edema or erythema.  Musculoskeletal: Asymptomatic fat pad atrophy and hammertoe boney deformities noted bilateral. Muscular strength within normal limits without painon range of motion. No pain with calf compression bilateral.  Assessment and Plan:  Problem List Items Addressed This Visit    None    Visit Diagnoses    Toe ulcer, left, limited to breakdown of skin (HCC)    -  Primary   Pre-ulcerative calluses       PVD (peripheral vascular disease) ([place])       Diminished pulses in lower extremity         -Examined patient.  -Mechanically debrided pre-ulcerative callus x4 using sterile chisel blade and debrided the wound left great toe using a sterile chisel blade removing all nonviable tissue hemostasis was achieved with manual pressure at the level of the dermis and wound measurements as above.  Patient tolerated procedure well without need for anesthesia. -Applied medihoney and advised patient to do the same at home daily. Advised patient to watch closely for any signs of infection if occurs to return to office or ER immediately. -Continue with toe foam padding to use at bedtime since patient refused to use it during the day when he is in shoes -Patient to return in 4-6 weeks for wound check or sooner if symptoms worsen.  Landis Martins, DPM

## 2018-04-09 DIAGNOSIS — I509 Heart failure, unspecified: Secondary | ICD-10-CM | POA: Diagnosis not present

## 2018-04-09 DIAGNOSIS — Z86718 Personal history of other venous thrombosis and embolism: Secondary | ICD-10-CM | POA: Diagnosis not present

## 2018-04-09 DIAGNOSIS — I2510 Atherosclerotic heart disease of native coronary artery without angina pectoris: Secondary | ICD-10-CM | POA: Diagnosis not present

## 2018-04-09 DIAGNOSIS — Z951 Presence of aortocoronary bypass graft: Secondary | ICD-10-CM | POA: Diagnosis not present

## 2018-04-09 DIAGNOSIS — Z8709 Personal history of other diseases of the respiratory system: Secondary | ICD-10-CM | POA: Diagnosis not present

## 2018-04-09 DIAGNOSIS — I255 Ischemic cardiomyopathy: Secondary | ICD-10-CM | POA: Diagnosis not present

## 2018-04-10 DIAGNOSIS — Z86718 Personal history of other venous thrombosis and embolism: Secondary | ICD-10-CM | POA: Diagnosis not present

## 2018-04-10 DIAGNOSIS — Z951 Presence of aortocoronary bypass graft: Secondary | ICD-10-CM | POA: Diagnosis not present

## 2018-04-10 DIAGNOSIS — Z8709 Personal history of other diseases of the respiratory system: Secondary | ICD-10-CM | POA: Diagnosis not present

## 2018-04-10 DIAGNOSIS — I255 Ischemic cardiomyopathy: Secondary | ICD-10-CM | POA: Diagnosis not present

## 2018-04-10 DIAGNOSIS — I509 Heart failure, unspecified: Secondary | ICD-10-CM | POA: Diagnosis not present

## 2018-04-10 DIAGNOSIS — I2510 Atherosclerotic heart disease of native coronary artery without angina pectoris: Secondary | ICD-10-CM | POA: Diagnosis not present

## 2018-04-11 DIAGNOSIS — I255 Ischemic cardiomyopathy: Secondary | ICD-10-CM | POA: Diagnosis not present

## 2018-04-11 DIAGNOSIS — Z86718 Personal history of other venous thrombosis and embolism: Secondary | ICD-10-CM | POA: Diagnosis not present

## 2018-04-11 DIAGNOSIS — I2510 Atherosclerotic heart disease of native coronary artery without angina pectoris: Secondary | ICD-10-CM | POA: Diagnosis not present

## 2018-04-11 DIAGNOSIS — I509 Heart failure, unspecified: Secondary | ICD-10-CM | POA: Diagnosis not present

## 2018-04-11 DIAGNOSIS — Z951 Presence of aortocoronary bypass graft: Secondary | ICD-10-CM | POA: Diagnosis not present

## 2018-04-11 DIAGNOSIS — Z8709 Personal history of other diseases of the respiratory system: Secondary | ICD-10-CM | POA: Diagnosis not present

## 2018-04-14 DIAGNOSIS — I2510 Atherosclerotic heart disease of native coronary artery without angina pectoris: Secondary | ICD-10-CM | POA: Diagnosis not present

## 2018-04-14 DIAGNOSIS — Z86718 Personal history of other venous thrombosis and embolism: Secondary | ICD-10-CM | POA: Diagnosis not present

## 2018-04-14 DIAGNOSIS — I255 Ischemic cardiomyopathy: Secondary | ICD-10-CM | POA: Diagnosis not present

## 2018-04-14 DIAGNOSIS — I509 Heart failure, unspecified: Secondary | ICD-10-CM | POA: Diagnosis not present

## 2018-04-14 DIAGNOSIS — Z8709 Personal history of other diseases of the respiratory system: Secondary | ICD-10-CM | POA: Diagnosis not present

## 2018-04-14 DIAGNOSIS — Z951 Presence of aortocoronary bypass graft: Secondary | ICD-10-CM | POA: Diagnosis not present

## 2018-04-17 DIAGNOSIS — I2510 Atherosclerotic heart disease of native coronary artery without angina pectoris: Secondary | ICD-10-CM | POA: Diagnosis not present

## 2018-04-17 DIAGNOSIS — Z86718 Personal history of other venous thrombosis and embolism: Secondary | ICD-10-CM | POA: Diagnosis not present

## 2018-04-17 DIAGNOSIS — I509 Heart failure, unspecified: Secondary | ICD-10-CM | POA: Diagnosis not present

## 2018-04-17 DIAGNOSIS — Z8709 Personal history of other diseases of the respiratory system: Secondary | ICD-10-CM | POA: Diagnosis not present

## 2018-04-17 DIAGNOSIS — Z951 Presence of aortocoronary bypass graft: Secondary | ICD-10-CM | POA: Diagnosis not present

## 2018-04-17 DIAGNOSIS — I255 Ischemic cardiomyopathy: Secondary | ICD-10-CM | POA: Diagnosis not present

## 2018-04-21 DIAGNOSIS — Z951 Presence of aortocoronary bypass graft: Secondary | ICD-10-CM | POA: Diagnosis not present

## 2018-04-21 DIAGNOSIS — Z8709 Personal history of other diseases of the respiratory system: Secondary | ICD-10-CM | POA: Diagnosis not present

## 2018-04-21 DIAGNOSIS — Z86718 Personal history of other venous thrombosis and embolism: Secondary | ICD-10-CM | POA: Diagnosis not present

## 2018-04-21 DIAGNOSIS — I2510 Atherosclerotic heart disease of native coronary artery without angina pectoris: Secondary | ICD-10-CM | POA: Diagnosis not present

## 2018-04-21 DIAGNOSIS — I255 Ischemic cardiomyopathy: Secondary | ICD-10-CM | POA: Diagnosis not present

## 2018-04-21 DIAGNOSIS — I509 Heart failure, unspecified: Secondary | ICD-10-CM | POA: Diagnosis not present

## 2018-04-24 DIAGNOSIS — Z951 Presence of aortocoronary bypass graft: Secondary | ICD-10-CM | POA: Diagnosis not present

## 2018-04-24 DIAGNOSIS — Z8709 Personal history of other diseases of the respiratory system: Secondary | ICD-10-CM | POA: Diagnosis not present

## 2018-04-24 DIAGNOSIS — Z86718 Personal history of other venous thrombosis and embolism: Secondary | ICD-10-CM | POA: Diagnosis not present

## 2018-04-24 DIAGNOSIS — I255 Ischemic cardiomyopathy: Secondary | ICD-10-CM | POA: Diagnosis not present

## 2018-04-24 DIAGNOSIS — I2510 Atherosclerotic heart disease of native coronary artery without angina pectoris: Secondary | ICD-10-CM | POA: Diagnosis not present

## 2018-04-24 DIAGNOSIS — I509 Heart failure, unspecified: Secondary | ICD-10-CM | POA: Diagnosis not present

## 2018-04-25 ENCOUNTER — Ambulatory Visit (INDEPENDENT_AMBULATORY_CARE_PROVIDER_SITE_OTHER): Payer: PRIVATE HEALTH INSURANCE

## 2018-04-25 DIAGNOSIS — Z86718 Personal history of other venous thrombosis and embolism: Secondary | ICD-10-CM | POA: Diagnosis not present

## 2018-04-25 DIAGNOSIS — I2510 Atherosclerotic heart disease of native coronary artery without angina pectoris: Secondary | ICD-10-CM | POA: Diagnosis not present

## 2018-04-25 DIAGNOSIS — Z8709 Personal history of other diseases of the respiratory system: Secondary | ICD-10-CM | POA: Diagnosis not present

## 2018-04-25 DIAGNOSIS — I442 Atrioventricular block, complete: Secondary | ICD-10-CM

## 2018-04-25 DIAGNOSIS — I255 Ischemic cardiomyopathy: Secondary | ICD-10-CM | POA: Diagnosis not present

## 2018-04-25 DIAGNOSIS — I509 Heart failure, unspecified: Secondary | ICD-10-CM | POA: Diagnosis not present

## 2018-04-25 DIAGNOSIS — Z951 Presence of aortocoronary bypass graft: Secondary | ICD-10-CM | POA: Diagnosis not present

## 2018-04-26 NOTE — Progress Notes (Signed)
Remote pacemaker transmission.   

## 2018-04-27 DIAGNOSIS — Z8709 Personal history of other diseases of the respiratory system: Secondary | ICD-10-CM | POA: Diagnosis not present

## 2018-04-27 DIAGNOSIS — I2510 Atherosclerotic heart disease of native coronary artery without angina pectoris: Secondary | ICD-10-CM | POA: Diagnosis not present

## 2018-04-27 DIAGNOSIS — Z951 Presence of aortocoronary bypass graft: Secondary | ICD-10-CM | POA: Diagnosis not present

## 2018-04-27 DIAGNOSIS — I255 Ischemic cardiomyopathy: Secondary | ICD-10-CM | POA: Diagnosis not present

## 2018-04-27 DIAGNOSIS — H353211 Exudative age-related macular degeneration, right eye, with active choroidal neovascularization: Secondary | ICD-10-CM | POA: Diagnosis not present

## 2018-04-27 DIAGNOSIS — Z86718 Personal history of other venous thrombosis and embolism: Secondary | ICD-10-CM | POA: Diagnosis not present

## 2018-04-27 DIAGNOSIS — I509 Heart failure, unspecified: Secondary | ICD-10-CM | POA: Diagnosis not present

## 2018-04-27 DIAGNOSIS — H353121 Nonexudative age-related macular degeneration, left eye, early dry stage: Secondary | ICD-10-CM | POA: Diagnosis not present

## 2018-05-01 DIAGNOSIS — I509 Heart failure, unspecified: Secondary | ICD-10-CM | POA: Diagnosis not present

## 2018-05-01 DIAGNOSIS — Z8709 Personal history of other diseases of the respiratory system: Secondary | ICD-10-CM | POA: Diagnosis not present

## 2018-05-01 DIAGNOSIS — Z86718 Personal history of other venous thrombosis and embolism: Secondary | ICD-10-CM | POA: Diagnosis not present

## 2018-05-01 DIAGNOSIS — Z951 Presence of aortocoronary bypass graft: Secondary | ICD-10-CM | POA: Diagnosis not present

## 2018-05-01 DIAGNOSIS — I255 Ischemic cardiomyopathy: Secondary | ICD-10-CM | POA: Diagnosis not present

## 2018-05-01 DIAGNOSIS — I2510 Atherosclerotic heart disease of native coronary artery without angina pectoris: Secondary | ICD-10-CM | POA: Diagnosis not present

## 2018-05-17 ENCOUNTER — Encounter: Payer: Self-pay | Admitting: Sports Medicine

## 2018-05-17 ENCOUNTER — Ambulatory Visit (INDEPENDENT_AMBULATORY_CARE_PROVIDER_SITE_OTHER): Payer: Medicare Other | Admitting: Sports Medicine

## 2018-05-17 VITALS — BP 126/66 | HR 62 | Temp 97.7°F | Resp 16

## 2018-05-17 DIAGNOSIS — I739 Peripheral vascular disease, unspecified: Secondary | ICD-10-CM

## 2018-05-17 DIAGNOSIS — L84 Corns and callosities: Secondary | ICD-10-CM

## 2018-05-17 DIAGNOSIS — R0989 Other specified symptoms and signs involving the circulatory and respiratory systems: Secondary | ICD-10-CM

## 2018-05-17 NOTE — Progress Notes (Signed)
Subjective: Anthony Osborn is a 84 y.o. male patient seen today in office for nail care and for follow-up evaluation of pre-ulcerative calluses and left great toe ulceration.  Patient has been applying medihoney to the area and dry dressing as instructed with help from wife and states that all areas feel better and that there is a dry scab to areas with no drainage.  Patient does admit that on the right upper leg there is a wound that is present that the home hospice nurse has been taking care of. Patient denies nausea, vomiting, fever, chills or any other constitutional symptoms at this time.  Patient has no other pedal complaints at this time.   Patient is currently under hospice care and assisted by wife this visit.  Patient Active Problem List   Diagnosis Date Noted  . Advanced age 28/18/2019  . Congestive cardiomyopathy ([place]) 11/22/2017  . Palpitations 11/22/2017  . Frail elderly 11/17/2017  . Anemia, macrocytic 10/21/2017  . Chronic fatigue 10/10/2017  . Cellulitis 05/16/2016  . Cellulitis of left lower extremity 05/13/2016  . Leg edema, left 05/13/2016  . Aftercare following surgery 04/27/2016  . Ingrown nail 04/20/2016  . Subungual hematoma of great toe of right foot 04/20/2016  . Ulcer of right foot, limited to breakdown of skin ([place]) 03/11/2016  . Cardiomyopathy, ischemic-EF 25-30% 01/24/2016  . Pacemaker (St Jude) implanted 01/23/16 01/24/2016  . Syncope 01/22/2016  . Third degree AV block ([place]) 01/22/2016  . Onychomycosis due to dermatophyte 01/20/2016  . Status post revision of total replacement of right knee 05/12/2015  . Pseudomonas infection   . DVT (deep venous thrombosis) ([place]) 04/27/2015  . Pyogenic arthritis of right knee joint ([place])   . Essential hypertension   . Encounter for palliative care   . Hx of CABG 2012 04/21/2015  . Septic arthritis of knee, right ([place]) 04/21/2015  . Cough 04/21/2015  . Hyperlipidemia   . Prostate cancer ([place])   . Hyperthyroidism      Current Outpatient Medications on File Prior to Visit  Medication Sig Dispense Refill  . acetaminophen (TYLENOL) 325 MG tablet Take 2 tablets (650 mg total) by mouth every 6 (six) hours as needed for mild pain (or Fever >/= 101).    Marland Kitchen aspirin EC 81 MG tablet Take 81 mg by mouth every evening.     Marland Kitchen CALCIUM-MAGNESIUM-ZINC PO Take 1 tablet by mouth 2 (two) times daily. Reported on 12/15/2015    . carvedilol (COREG) 6.25 MG tablet Take 1 tablet (6.25 mg total) by mouth 2 (two) times daily. 180 tablet 3  . Cholecalciferol (VITAMIN D-3 PO) Take 1,000 Units by mouth daily.      . furosemide (LASIX) 40 MG tablet TAKE ONE TABLET BY MOUTH ONCE DAILY 30 tablet 10  . lisinopril (PRINIVIL,ZESTRIL) 5 MG tablet Take 1 tablet (5 mg total) by mouth daily. Please make overdue yearly appt with Dr. Angelena Form before anymore refills. 1st attempt 90 tablet 3  . loratadine (CLARITIN) 10 MG tablet Take 10 mg by mouth at bedtime.    . Multiple Vitamins-Minerals (ICAPS) CAPS Take 2 capsules by mouth daily.    . naphazoline-pheniramine (NAPHCON-A) 0.025-0.3 % ophthalmic solution Place 1 drop into both eyes at bedtime.    . simvastatin (ZOCOR) 10 MG tablet Take 5 mg by mouth at bedtime.     . Wound Dressings (MEDIHONEY WOUND/BURN DRESSING) GEL Apply a small amount daily to foot wounds 44 mL 1   No current facility-administered medications on file prior to  visit.     No Known Allergies  Objective: Physical Exam  General: Well developed, nourished, no acute distress, awake, alert and oriented x 3  Vascular: Dorsalis pedis artery, Very faint 1/4 bilateral, Posterior tibial artery 0/4 bilateral, skin temperature warm to warm proximal to distal bilateral lower extremities, + varicosities, no pedal hair present bilateral. +1 pitting edema bilateral.   Neurological: Gross sensation present via light touch bilateral. Protective and vibratory diminished bilateral.   Dermatological: Skin is warm, dry, and supple  bilateral, Nails are short and discolored with moderate subungal debris, no webspace macerations present bilateral, mild reactive calluses pre-ulcerative in nature, sub-met 1 and 5 bilateral and great toe on left. Musculoskeletal: Asymptomatic fat pad atrophy and hammertoe boney deformities noted bilateral. Muscular strength within normal limits without painon range of motion. No pain with calf compression bilateral.  Assessment and Plan:  Problem List Items Addressed This Visit    None    Visit Diagnoses    Pre-ulcerative calluses    -  Primary   PVD (peripheral vascular disease) ([place])       Diminished pulses in lower extremity         -Examined patient.  -Mechanically debrided pre-ulcerative callus x5 using sterile chisel blade; No ulceration present at great toe on left -Applied medihoney and advised patient to do the same at home daily. Advised patient to watch closely for any signs of infection if occurs to return to office or ER immediately. -Continue with offloading padding in shoes -Advised patient to discuss with home hospice nurse wound care for upper right leg and if they need orders I would recommend meta honey to the site however on today I just applied a small protective dry gauze since his hospice nurse is currently caring for this site -Patient to return in 6-8 weeks for wound check or sooner if symptoms worsen.  Landis Martins, DPM

## 2018-06-14 ENCOUNTER — Telehealth: Payer: Self-pay | Admitting: *Deleted

## 2018-06-14 NOTE — Telephone Encounter (Signed)
Spoke with patient's daughter, Lynn Ito ([place]). She reports patient is under hospice care so he has not established f/u with [facility]. Explained findings of AF on PPM remote transmission. Lynn Ito is aware that there will likely be no changes at this point as patient is receiving hospice care. She thanked me for my call and denied additional questions at this time.  Reviewed with Dr. Lovena Le, who recommended no changes at this time.

## 2018-06-15 ENCOUNTER — Telehealth: Payer: Self-pay | Admitting: Nurse Practitioner

## 2018-06-15 NOTE — Telephone Encounter (Signed)
New Message   Melissa-pt's daughter is calling states that a nurse called yesterday evening and spoke with her mom about an episode that was found on the transmission but daughter is requesting a call so that she will know what to tell Hospice. Please call   1. Has your device fired? no  2. Is you device beeping? no  3. Are you experiencing draining or swelling at device site? no  4. Are you calling to see if we received your device transmission?   5. Have you passed out? no    Please route to [place]

## 2018-06-15 NOTE — Telephone Encounter (Signed)
Spoke with Anthony Osborn, patient's granddaughter. Anthony Osborn, patient's daughter, made Anthony Osborn aware of AF on PPM remote transmission and of Dr. Tanna Furry recommendation for no changes at this time. Anthony Osborn is helping family coordinate hospice care. She requests that we do not leave messages with patient's wife, Anthony Osborn due to ? dementia. Per DPR and Anthony Osborn, ok to continue speaking with Anthony Osborn and Anthony Osborn (patient's children). Contact list updated.  She will give hospice RN our direct number if there are any questions/concerns regarding PPM f/u. She verbalizes appreciation of call and denies additional questions at this time.

## 2018-06-21 LAB — CUP PACEART REMOTE DEVICE CHECK
Battery Remaining Longevity: 131 mo
Battery Voltage: 2.99 V
Brady Statistic AP VP Percent: 3.5 %
Brady Statistic AP VS Percent: 1 %
Brady Statistic AS VP Percent: 96 %
Brady Statistic RA Percent Paced: 3.3 %
Date Time Interrogation Session: 20191119075508
Implantable Lead Implant Date: 20170818
Implantable Lead Implant Date: 20170818
Implantable Lead Location: 753859
Implantable Pulse Generator Implant Date: 20170818
Lead Channel Impedance Value: 450 Ohm
Lead Channel Impedance Value: 460 Ohm
Lead Channel Pacing Threshold Amplitude: 0.75 V
Lead Channel Pacing Threshold Amplitude: 1 V
Lead Channel Pacing Threshold Pulse Width: 0.4 ms
Lead Channel Sensing Intrinsic Amplitude: 8.1 mV
Lead Channel Setting Pacing Amplitude: 2.5 V
MDC IDC LEAD LOCATION: 753860
MDC IDC MSMT BATTERY REMAINING PERCENTAGE: 95.5 %
MDC IDC MSMT LEADCHNL RA SENSING INTR AMPL: 3.8 mV
MDC IDC MSMT LEADCHNL RV PACING THRESHOLD PULSEWIDTH: 0.4 ms
MDC IDC SET LEADCHNL RV PACING AMPLITUDE: 1 V
MDC IDC SET LEADCHNL RV PACING PULSEWIDTH: 0.4 ms
MDC IDC SET LEADCHNL RV SENSING SENSITIVITY: 2 mV
MDC IDC STAT BRADY AS VS PERCENT: 1 %
MDC IDC STAT BRADY RV PERCENT PACED: 99 %
Pulse Gen Model: 2272
Pulse Gen Serial Number: 7939408

## 2018-06-23 ENCOUNTER — Other Ambulatory Visit: Payer: Self-pay | Admitting: Internal Medicine

## 2018-07-05 ENCOUNTER — Ambulatory Visit (INDEPENDENT_AMBULATORY_CARE_PROVIDER_SITE_OTHER): Payer: Medicare Other | Admitting: Sports Medicine

## 2018-07-05 ENCOUNTER — Encounter: Payer: Self-pay | Admitting: Sports Medicine

## 2018-07-05 VITALS — BP 114/76 | HR 71 | Temp 98.0°F

## 2018-07-05 DIAGNOSIS — I739 Peripheral vascular disease, unspecified: Secondary | ICD-10-CM

## 2018-07-05 DIAGNOSIS — R0989 Other specified symptoms and signs involving the circulatory and respiratory systems: Secondary | ICD-10-CM | POA: Diagnosis not present

## 2018-07-05 DIAGNOSIS — L84 Corns and callosities: Secondary | ICD-10-CM

## 2018-07-05 NOTE — Progress Notes (Signed)
Subjective: Anthony Osborn is a 85 y.o. male patient seen today in office for nail care and for follow-up evaluation of pre-ulcerative calluses bilateral.  Patient has wife assisting with applying meta honey and Band-Aid dressings patient reports that the areas look good but the calluses sometimes still get very sore.  Patient denies nausea, vomiting, fever, chills or any other constitutional symptoms at this time.  Patient has no other pedal complaints at this time.   Patient is currently under hospice care and assisted by wife at this visit.  Patient Active Problem List   Diagnosis Date Noted  . Advanced age 35/18/2019  . Congestive cardiomyopathy ([place]) 11/22/2017  . Palpitations 11/22/2017  . Frail elderly 11/17/2017  . Anemia, macrocytic 10/21/2017  . Chronic fatigue 10/10/2017  . Cellulitis 05/16/2016  . Cellulitis of left lower extremity 05/13/2016  . Leg edema, left 05/13/2016  . Aftercare following surgery 04/27/2016  . Ingrown nail 04/20/2016  . Subungual hematoma of great toe of right foot 04/20/2016  . Ulcer of right foot, limited to breakdown of skin ([place]) 03/11/2016  . Cardiomyopathy, ischemic-EF 25-30% 01/24/2016  . Pacemaker (St Jude) implanted 01/23/16 01/24/2016  . Syncope 01/22/2016  . Third degree AV block ([place]) 01/22/2016  . Onychomycosis due to dermatophyte 01/20/2016  . Status post revision of total replacement of right knee 05/12/2015  . Pseudomonas infection   . DVT (deep venous thrombosis) ([place]) 04/27/2015  . Pyogenic arthritis of right knee joint ([place])   . Essential hypertension   . Encounter for palliative care   . Hx of CABG 2012 04/21/2015  . Septic arthritis of knee, right ([place]) 04/21/2015  . Cough 04/21/2015  . Hyperlipidemia   . Prostate cancer ([place])   . Hyperthyroidism     Current Outpatient Medications on File Prior to Visit  Medication Sig Dispense Refill  . acetaminophen (TYLENOL) 325 MG tablet Take 2 tablets (650 mg total) by mouth every 6  (six) hours as needed for mild pain (or Fever >/= 101).    Marland Kitchen aspirin EC 81 MG tablet Take 81 mg by mouth every evening.     Marland Kitchen CALCIUM-MAGNESIUM-ZINC PO Take 1 tablet by mouth 2 (two) times daily. Reported on 12/15/2015    . carvedilol (COREG) 6.25 MG tablet Take 1 tablet (6.25 mg total) by mouth 2 (two) times daily. 180 tablet 3  . Cholecalciferol (VITAMIN D-3 PO) Take 1,000 Units by mouth daily.      . furosemide (LASIX) 40 MG tablet TAKE ONE TABLET BY MOUTH ONCE DAILY 30 tablet 10  . lisinopril (PRINIVIL,ZESTRIL) 5 MG tablet Take 1 tablet (5 mg total) by mouth daily. Please make overdue yearly appt with Dr. Angelena Form before anymore refills. 1st attempt 90 tablet 3  . loratadine (CLARITIN) 10 MG tablet Take 10 mg by mouth at bedtime.    . Multiple Vitamins-Minerals (ICAPS) CAPS Take 2 capsules by mouth daily.    . naphazoline-pheniramine (NAPHCON-A) 0.025-0.3 % ophthalmic solution Place 1 drop into both eyes at bedtime.    . simvastatin (ZOCOR) 10 MG tablet Take 5 mg by mouth at bedtime.     . Wound Dressings (MEDIHONEY WOUND/BURN DRESSING) GEL Apply a small amount daily to foot wounds 44 mL 1   No current facility-administered medications on file prior to visit.     No Known Allergies  Objective: Physical Exam  General: Well developed, nourished, no acute distress, awake, alert and oriented x 3  Vascular: Dorsalis pedis artery, Very faint 1/4 bilateral, Posterior tibial artery 0/4  bilateral, skin temperature warm to warm proximal to distal bilateral lower extremities, + varicosities, no pedal hair present bilateral. +1 pitting edema bilateral.   Neurological: Gross sensation present via light touch bilateral. Protective and vibratory diminished bilateral.   Dermatological: Skin is warm, dry, and supple bilateral, Nails are short and discolored with moderate subungal debris, no webspace macerations present bilateral, mild reactive calluses pre-ulcerative in nature, sub-met 1 and 5 bilateral  and great toe on left. Previous ulcers have healed.   Musculoskeletal: Asymptomatic fat pad atrophy and hammertoe boney deformities noted bilateral. Muscular strength within normal limits without painon range of motion. No pain with calf compression bilateral.  Assessment and Plan:  Problem List Items Addressed This Visit    None    Visit Diagnoses    Pre-ulcerative calluses    -  Primary   PVD (peripheral vascular disease) ([place])       Diminished pulses in lower extremity         -Examined patient -Mechanically debrided pre-ulcerative callus x5 using sterile chisel blade; No ulceration present at great toe on left -Applied medihoney and advised patient to do the same at home daily. Advised patient to watch closely for any signs of infection if occurs to return to office or ER immediately. -Continue with offloading padding in shoes -Patient to return in 6-8 weeks for wound check or sooner if symptoms worsen.  Landis Martins, DPM

## 2018-07-25 ENCOUNTER — Ambulatory Visit (INDEPENDENT_AMBULATORY_CARE_PROVIDER_SITE_OTHER): Payer: BLUE CROSS/BLUE SHIELD

## 2018-07-25 DIAGNOSIS — I442 Atrioventricular block, complete: Secondary | ICD-10-CM

## 2018-07-25 DIAGNOSIS — R001 Bradycardia, unspecified: Secondary | ICD-10-CM

## 2018-07-26 ENCOUNTER — Telehealth: Payer: Self-pay

## 2018-07-26 NOTE — Telephone Encounter (Signed)
Left message for patient to remind of missed remote transmission.  

## 2018-07-27 LAB — CUP PACEART REMOTE DEVICE CHECK
Battery Remaining Longevity: 128 mo
Battery Voltage: 2.98 V
Brady Statistic AP VP Percent: 3.5 %
Brady Statistic AS VS Percent: 1 %
Brady Statistic RA Percent Paced: 3.2 %
Implantable Lead Implant Date: 20170818
Implantable Lead Implant Date: 20170818
Implantable Lead Location: 753859
Implantable Pulse Generator Implant Date: 20170818
Lead Channel Impedance Value: 450 Ohm
Lead Channel Pacing Threshold Amplitude: 0.875 V
Lead Channel Pacing Threshold Amplitude: 1 V
Lead Channel Pacing Threshold Pulse Width: 0.4 ms
Lead Channel Pacing Threshold Pulse Width: 0.4 ms
Lead Channel Sensing Intrinsic Amplitude: 0.5 mV
Lead Channel Setting Pacing Amplitude: 1.125
Lead Channel Setting Pacing Pulse Width: 0.4 ms
MDC IDC LEAD LOCATION: 753860
MDC IDC MSMT BATTERY REMAINING PERCENTAGE: 95.5 %
MDC IDC MSMT LEADCHNL RV IMPEDANCE VALUE: 440 Ohm
MDC IDC MSMT LEADCHNL RV SENSING INTR AMPL: 8.1 mV
MDC IDC PG SERIAL: 7939408
MDC IDC SESS DTM: 20200218143820
MDC IDC SET LEADCHNL RA PACING AMPLITUDE: 2.5 V
MDC IDC SET LEADCHNL RV SENSING SENSITIVITY: 2 mV
MDC IDC STAT BRADY AP VS PERCENT: 1 %
MDC IDC STAT BRADY AS VP PERCENT: 96 %
MDC IDC STAT BRADY RV PERCENT PACED: 99 %

## 2018-08-02 NOTE — Progress Notes (Signed)
Remote pacemaker transmission.   

## 2018-08-07 ENCOUNTER — Encounter: Payer: Self-pay | Admitting: Cardiology

## 2018-08-23 ENCOUNTER — Ambulatory Visit: Payer: Medicare Other | Admitting: Sports Medicine

## 2018-10-24 ENCOUNTER — Ambulatory Visit: Admitting: *Deleted

## 2018-10-24 ENCOUNTER — Other Ambulatory Visit: Payer: Self-pay

## 2018-10-25 LAB — CUP PACEART REMOTE DEVICE CHECK
Date Time Interrogation Session: 20200520121743
Implantable Lead Implant Date: 20170818
Implantable Lead Implant Date: 20170818
Implantable Lead Location: 753859
Implantable Lead Location: 753860
Implantable Pulse Generator Implant Date: 20170818
Pulse Gen Model: 2272
Pulse Gen Serial Number: 7939408

## 2018-10-31 ENCOUNTER — Telehealth: Payer: Self-pay

## 2018-10-31 NOTE — Telephone Encounter (Signed)
Attempted to reach to discuss discontinuing remote monitoring per Dr. Lovena Le as pt is on hospice. No answer, contact comments state to not leave messages.

## 2018-10-31 NOTE — Telephone Encounter (Signed)
Discussed with Daiva Huge.  Pt now in hospice and remote monitoring to be discontinued. She verbalized understanding.    Legrand Como [address]" [place], PA-C 10/31/2018 4:00 PM

## 2018-11-24 ENCOUNTER — Ambulatory Visit: Payer: BLUE CROSS/BLUE SHIELD | Admitting: Sports Medicine

## 2018-12-06 ENCOUNTER — Ambulatory Visit (INDEPENDENT_AMBULATORY_CARE_PROVIDER_SITE_OTHER): Payer: Medicare Other | Admitting: Sports Medicine

## 2018-12-06 ENCOUNTER — Other Ambulatory Visit: Payer: Self-pay

## 2018-12-06 ENCOUNTER — Encounter: Payer: Self-pay | Admitting: Sports Medicine

## 2018-12-06 VITALS — Temp 98.7°F | Resp 16

## 2018-12-06 DIAGNOSIS — M79675 Pain in left toe(s): Secondary | ICD-10-CM

## 2018-12-06 DIAGNOSIS — L84 Corns and callosities: Secondary | ICD-10-CM

## 2018-12-06 DIAGNOSIS — M79674 Pain in right toe(s): Secondary | ICD-10-CM | POA: Diagnosis not present

## 2018-12-06 DIAGNOSIS — I739 Peripheral vascular disease, unspecified: Secondary | ICD-10-CM

## 2018-12-06 DIAGNOSIS — L97511 Non-pressure chronic ulcer of other part of right foot limited to breakdown of skin: Secondary | ICD-10-CM | POA: Diagnosis not present

## 2018-12-06 DIAGNOSIS — B351 Tinea unguium: Secondary | ICD-10-CM | POA: Diagnosis not present

## 2018-12-06 DIAGNOSIS — R0989 Other specified symptoms and signs involving the circulatory and respiratory systems: Secondary | ICD-10-CM

## 2018-12-06 NOTE — Progress Notes (Signed)
Subjective: Anthony Osborn is a 84 y.o. male patient seen today in office for nail care and for follow-up evaluation of pre-ulcerative calluses bilateral.  Patient reports that these areas have been getting sore and is now living with his daughter and does not have a wife to help care for these anymore.  Patient denies nausea, vomiting, fever, chills or any other constitutional symptoms at this time.  Patient has no other pedal complaints at this time.   Patient is currently under hospice care and is living with his daughter.  Patient Active Problem List   Diagnosis Date Noted  . Advanced age 82/18/2019  . Congestive cardiomyopathy ([place]) 11/22/2017  . Palpitations 11/22/2017  . Frail elderly 11/17/2017  . Anemia, macrocytic 10/21/2017  . Chronic fatigue 10/10/2017  . Cellulitis 05/16/2016  . Cellulitis of left lower extremity 05/13/2016  . Leg edema, left 05/13/2016  . Aftercare following surgery 04/27/2016  . Ingrown nail 04/20/2016  . Subungual hematoma of great toe of right foot 04/20/2016  . Ulcer of right foot, limited to breakdown of skin ([place]) 03/11/2016  . Cardiomyopathy, ischemic-EF 25-30% 01/24/2016  . Pacemaker (St Jude) implanted 01/23/16 01/24/2016  . Syncope 01/22/2016  . Third degree AV block ([place]) 01/22/2016  . Onychomycosis due to dermatophyte 01/20/2016  . Status post revision of total replacement of right knee 05/12/2015  . Pseudomonas infection   . DVT (deep venous thrombosis) ([place]) 04/27/2015  . Pyogenic arthritis of right knee joint ([place])   . Essential hypertension   . Encounter for palliative care   . Hx of CABG 2012 04/21/2015  . Septic arthritis of knee, right ([place]) 04/21/2015  . Cough 04/21/2015  . Hyperlipidemia   . Prostate cancer ([place])   . Hyperthyroidism     Current Outpatient Medications on File Prior to Visit  Medication Sig Dispense Refill  . acetaminophen (TYLENOL) 325 MG tablet Take 2 tablets (650 mg total) by mouth every 6 (six) hours as  needed for mild pain (or Fever >/= 101).    Marland Kitchen aspirin EC 81 MG tablet Take 81 mg by mouth every evening.     Marland Kitchen CALCIUM-MAGNESIUM-ZINC PO Take 1 tablet by mouth 2 (two) times daily. Reported on 12/15/2015    . carvedilol (COREG) 6.25 MG tablet Take 1 tablet (6.25 mg total) by mouth 2 (two) times daily. 180 tablet 3  . Cholecalciferol (VITAMIN D-3 PO) Take 1,000 Units by mouth daily.      . furosemide (LASIX) 40 MG tablet TAKE ONE TABLET BY MOUTH ONCE DAILY 30 tablet 10  . lisinopril (PRINIVIL,ZESTRIL) 5 MG tablet Take 1 tablet (5 mg total) by mouth daily. Please make overdue yearly appt with Dr. Angelena Form before anymore refills. 1st attempt 90 tablet 3  . loratadine (CLARITIN) 10 MG tablet Take 10 mg by mouth at bedtime.    Marland Kitchen LORazepam (ATIVAN) 0.5 MG tablet TK 1 TO 2 TS PO Q 6 H AS NEEDED FOR ANXIETY OR AGITATION    . Multiple Vitamins-Minerals (ICAPS) CAPS Take 2 capsules by mouth daily.    . naphazoline-pheniramine (NAPHCON-A) 0.025-0.3 % ophthalmic solution Place 1 drop into both eyes at bedtime.    . Omeprazole 20 MG TBEC     . simvastatin (ZOCOR) 10 MG tablet Take 5 mg by mouth at bedtime.     Marland Kitchen spironolactone (ALDACTONE) 25 MG tablet     . Wound Dressings (MEDIHONEY WOUND/BURN DRESSING) GEL Apply a small amount daily to foot wounds 44 mL 1   No current facility-administered  medications on file prior to visit.     No Known Allergies  Objective: Physical Exam  General: Well developed, nourished, no acute distress, awake, alert and oriented x 3  Vascular: Dorsalis pedis artery, Very faint 1/4 bilateral, Posterior tibial artery 0/4 bilateral, skin temperature warm to warm proximal to distal bilateral lower extremities, + varicosities, no pedal hair present bilateral. +1 pitting edema bilateral.   Neurological: Gross sensation present via light touch bilateral. Protective and vibratory diminished bilateral.   Dermatological: Skin is warm, dry, and supple bilateral, Nails are elongated  and discolored with moderate subungal debris, no webspace macerations present bilateral, mild reactive calluses pre-ulcerative in nature, sub-met 1 and 5 bilateral and great toe on left. After debriding the right sub met 1 area there is an open ulcer that measures pre and post debridement 0.1x0.1cm with no active drainage with no signs of infection noted.   Musculoskeletal: Asymptomatic fat pad atrophy and hammertoe boney deformities noted bilateral. Muscular strength within normal limits without painon range of motion. No pain with calf compression bilateral.   Assessment and Plan:  Problem List Items Addressed This Visit    None    Visit Diagnoses    Pre-ulcerative calluses    -  Primary   PVD (peripheral vascular disease) ([place])       Relevant Medications   spironolactone (ALDACTONE) 25 MG tablet   Diminished pulses in lower extremity       Right foot ulcer, limited to breakdown of skin ([place])       sub met 1 at area of callus   Pain due to onychomycosis of toenails of both feet         -Examined patient -Mechanically debrided nails x 10 using sterile nail nipper without incident -Excisionally dedbrided ulceration at Right sub met 1 to healthy bleeding borders removing nonviable tissue using a sterile chisel blade. Wound measures post debridement as above. Wound was debrided to the level of the dermis with viable wound base exposed to promote healing. Hemostasis was achieved with manuel pressure. Patient tolerated procedure well without any discomfort or anesthesia necessary for this wound debridement.  -Applied medihoney and advised patient to do the same at home every other day to right sub-met 1. Advised patient to watch closely for any signs of infection if occurs to return to office or ER immediately. -Continue with offloading padding in shoes like before -Patient to return in 8-10 weeks for wound check and nail care or sooner if symptoms worsen.  Landis Martins, DPM

## 2019-02-09 ENCOUNTER — Ambulatory Visit: Payer: Medicare Other | Admitting: Sports Medicine

## 2019-10-21 DIAGNOSIS — K1120 Sialoadenitis, unspecified: Secondary | ICD-10-CM | POA: Diagnosis not present

## 2019-10-21 DIAGNOSIS — I5022 Chronic systolic (congestive) heart failure: Secondary | ICD-10-CM | POA: Diagnosis not present

## 2019-10-21 DIAGNOSIS — D72829 Elevated white blood cell count, unspecified: Secondary | ICD-10-CM

## 2019-10-21 DIAGNOSIS — A419 Sepsis, unspecified organism: Secondary | ICD-10-CM

## 2019-10-21 DIAGNOSIS — E871 Hypo-osmolality and hyponatremia: Secondary | ICD-10-CM

## 2019-10-22 DIAGNOSIS — I5022 Chronic systolic (congestive) heart failure: Secondary | ICD-10-CM | POA: Diagnosis not present

## 2019-10-22 DIAGNOSIS — K1120 Sialoadenitis, unspecified: Secondary | ICD-10-CM | POA: Diagnosis not present

## 2019-10-22 DIAGNOSIS — A419 Sepsis, unspecified organism: Secondary | ICD-10-CM | POA: Diagnosis not present

## 2019-10-22 DIAGNOSIS — E871 Hypo-osmolality and hyponatremia: Secondary | ICD-10-CM | POA: Diagnosis not present

## 2019-10-23 DIAGNOSIS — K1120 Sialoadenitis, unspecified: Secondary | ICD-10-CM | POA: Diagnosis not present

## 2019-10-23 DIAGNOSIS — A419 Sepsis, unspecified organism: Secondary | ICD-10-CM | POA: Diagnosis not present

## 2019-10-23 DIAGNOSIS — I5022 Chronic systolic (congestive) heart failure: Secondary | ICD-10-CM | POA: Diagnosis not present

## 2019-10-23 DIAGNOSIS — E871 Hypo-osmolality and hyponatremia: Secondary | ICD-10-CM | POA: Diagnosis not present

## 2019-10-24 DIAGNOSIS — E871 Hypo-osmolality and hyponatremia: Secondary | ICD-10-CM | POA: Diagnosis not present

## 2019-10-24 DIAGNOSIS — I5022 Chronic systolic (congestive) heart failure: Secondary | ICD-10-CM | POA: Diagnosis not present

## 2019-10-24 DIAGNOSIS — A419 Sepsis, unspecified organism: Secondary | ICD-10-CM | POA: Diagnosis not present

## 2019-10-24 DIAGNOSIS — K1120 Sialoadenitis, unspecified: Secondary | ICD-10-CM | POA: Diagnosis not present

## 2019-10-25 DIAGNOSIS — E871 Hypo-osmolality and hyponatremia: Secondary | ICD-10-CM | POA: Diagnosis not present

## 2019-10-25 DIAGNOSIS — A419 Sepsis, unspecified organism: Secondary | ICD-10-CM | POA: Diagnosis not present

## 2019-10-25 DIAGNOSIS — I5022 Chronic systolic (congestive) heart failure: Secondary | ICD-10-CM | POA: Diagnosis not present

## 2019-10-25 DIAGNOSIS — K1120 Sialoadenitis, unspecified: Secondary | ICD-10-CM | POA: Diagnosis not present

## 2019-10-26 DIAGNOSIS — K1120 Sialoadenitis, unspecified: Secondary | ICD-10-CM | POA: Diagnosis not present

## 2019-10-26 DIAGNOSIS — A419 Sepsis, unspecified organism: Secondary | ICD-10-CM

## 2019-10-26 DIAGNOSIS — I5022 Chronic systolic (congestive) heart failure: Secondary | ICD-10-CM | POA: Diagnosis not present

## 2019-10-26 DIAGNOSIS — E871 Hypo-osmolality and hyponatremia: Secondary | ICD-10-CM | POA: Diagnosis not present

## 2019-10-27 DIAGNOSIS — E871 Hypo-osmolality and hyponatremia: Secondary | ICD-10-CM | POA: Diagnosis not present

## 2019-10-27 DIAGNOSIS — K1120 Sialoadenitis, unspecified: Secondary | ICD-10-CM | POA: Diagnosis not present

## 2019-10-27 DIAGNOSIS — I5022 Chronic systolic (congestive) heart failure: Secondary | ICD-10-CM | POA: Diagnosis not present

## 2019-10-27 DIAGNOSIS — A419 Sepsis, unspecified organism: Secondary | ICD-10-CM | POA: Diagnosis not present

## 2019-10-28 DIAGNOSIS — K1120 Sialoadenitis, unspecified: Secondary | ICD-10-CM | POA: Diagnosis not present

## 2019-10-28 DIAGNOSIS — E871 Hypo-osmolality and hyponatremia: Secondary | ICD-10-CM | POA: Diagnosis not present

## 2019-10-28 DIAGNOSIS — A419 Sepsis, unspecified organism: Secondary | ICD-10-CM | POA: Diagnosis not present

## 2019-10-28 DIAGNOSIS — I5022 Chronic systolic (congestive) heart failure: Secondary | ICD-10-CM | POA: Diagnosis not present

## 2019-10-29 DIAGNOSIS — E871 Hypo-osmolality and hyponatremia: Secondary | ICD-10-CM | POA: Diagnosis not present

## 2019-10-29 DIAGNOSIS — K1120 Sialoadenitis, unspecified: Secondary | ICD-10-CM | POA: Diagnosis not present

## 2019-10-29 DIAGNOSIS — I5022 Chronic systolic (congestive) heart failure: Secondary | ICD-10-CM | POA: Diagnosis not present

## 2019-10-29 DIAGNOSIS — A419 Sepsis, unspecified organism: Secondary | ICD-10-CM | POA: Diagnosis not present

## 2019-10-30 DIAGNOSIS — E871 Hypo-osmolality and hyponatremia: Secondary | ICD-10-CM | POA: Diagnosis not present

## 2019-10-30 DIAGNOSIS — A419 Sepsis, unspecified organism: Secondary | ICD-10-CM | POA: Diagnosis not present

## 2019-10-30 DIAGNOSIS — K1120 Sialoadenitis, unspecified: Secondary | ICD-10-CM | POA: Diagnosis not present

## 2019-10-30 DIAGNOSIS — I5022 Chronic systolic (congestive) heart failure: Secondary | ICD-10-CM | POA: Diagnosis not present

## 2019-10-31 DIAGNOSIS — I5022 Chronic systolic (congestive) heart failure: Secondary | ICD-10-CM | POA: Diagnosis not present

## 2019-10-31 DIAGNOSIS — E871 Hypo-osmolality and hyponatremia: Secondary | ICD-10-CM | POA: Diagnosis not present

## 2019-10-31 DIAGNOSIS — K1120 Sialoadenitis, unspecified: Secondary | ICD-10-CM | POA: Diagnosis not present

## 2019-10-31 DIAGNOSIS — A419 Sepsis, unspecified organism: Secondary | ICD-10-CM | POA: Diagnosis not present

## 2019-12-06 DEATH — deceased
